# Patient Record
Sex: Female | Born: 1975 | Race: White | Hispanic: No | Marital: Married | Smoking: Former smoker
Health system: Southern US, Community
[De-identification: ages and names within clinical notes are randomized; demographics above are authoritative.]

## PROBLEM LIST (undated history)

## (undated) DIAGNOSIS — E559 Vitamin D deficiency, unspecified: Secondary | ICD-10-CM

## (undated) DIAGNOSIS — J45909 Unspecified asthma, uncomplicated: Secondary | ICD-10-CM

## (undated) DIAGNOSIS — M2550 Pain in unspecified joint: Secondary | ICD-10-CM

## (undated) DIAGNOSIS — T7840XA Allergy, unspecified, initial encounter: Secondary | ICD-10-CM

## (undated) DIAGNOSIS — E119 Type 2 diabetes mellitus without complications: Secondary | ICD-10-CM

## (undated) DIAGNOSIS — E039 Hypothyroidism, unspecified: Secondary | ICD-10-CM

## (undated) DIAGNOSIS — R12 Heartburn: Secondary | ICD-10-CM

## (undated) DIAGNOSIS — R000 Tachycardia, unspecified: Secondary | ICD-10-CM

## (undated) DIAGNOSIS — R7303 Prediabetes: Secondary | ICD-10-CM

## (undated) DIAGNOSIS — I10 Essential (primary) hypertension: Secondary | ICD-10-CM

## (undated) DIAGNOSIS — E079 Disorder of thyroid, unspecified: Secondary | ICD-10-CM

## (undated) DIAGNOSIS — M549 Dorsalgia, unspecified: Secondary | ICD-10-CM

## (undated) DIAGNOSIS — E538 Deficiency of other specified B group vitamins: Secondary | ICD-10-CM

## (undated) DIAGNOSIS — E669 Obesity, unspecified: Secondary | ICD-10-CM

## (undated) DIAGNOSIS — L409 Psoriasis, unspecified: Secondary | ICD-10-CM

## (undated) HISTORY — DX: Essential (primary) hypertension: I10

## (undated) HISTORY — DX: Vitamin D deficiency, unspecified: E55.9

## (undated) HISTORY — DX: Tachycardia, unspecified: R00.0

## (undated) HISTORY — PX: OTHER SURGICAL HISTORY: SHX169

## (undated) HISTORY — DX: Hypothyroidism, unspecified: E03.9

## (undated) HISTORY — DX: Allergy, unspecified, initial encounter: T78.40XA

## (undated) HISTORY — DX: Dorsalgia, unspecified: M54.9

## (undated) HISTORY — DX: Disorder of thyroid, unspecified: E07.9

## (undated) HISTORY — DX: Obesity, unspecified: E66.9

## (undated) HISTORY — PX: HAND SURGERY: SHX662

## (undated) HISTORY — DX: Type 2 diabetes mellitus without complications: E11.9

## (undated) HISTORY — PX: HERNIA REPAIR: SHX51

## (undated) HISTORY — DX: Psoriasis, unspecified: L40.9

## (undated) HISTORY — DX: Prediabetes: R73.03

## (undated) HISTORY — DX: Heartburn: R12

## (undated) HISTORY — DX: Unspecified asthma, uncomplicated: J45.909

## (undated) HISTORY — DX: Pain in unspecified joint: M25.50

## (undated) HISTORY — DX: Deficiency of other specified B group vitamins: E53.8

---

## 2001-07-13 ENCOUNTER — Ambulatory Visit (HOSPITAL_COMMUNITY): Admission: RE | Admit: 2001-07-13 | Discharge: 2001-07-13 | Payer: Self-pay | Admitting: Neurology

## 2001-07-13 ENCOUNTER — Encounter: Payer: Self-pay | Admitting: Neurology

## 2005-09-29 ENCOUNTER — Ambulatory Visit: Payer: Self-pay | Admitting: Obstetrics & Gynecology

## 2006-11-13 ENCOUNTER — Inpatient Hospital Stay: Payer: Self-pay | Admitting: Obstetrics & Gynecology

## 2008-09-20 ENCOUNTER — Inpatient Hospital Stay: Payer: Self-pay | Admitting: Obstetrics & Gynecology

## 2009-09-01 ENCOUNTER — Ambulatory Visit: Payer: Self-pay | Admitting: Family Medicine

## 2009-09-09 ENCOUNTER — Ambulatory Visit: Payer: Self-pay | Admitting: Orthopedic Surgery

## 2011-09-10 ENCOUNTER — Ambulatory Visit: Payer: Self-pay | Admitting: General Practice

## 2012-01-05 ENCOUNTER — Ambulatory Visit: Payer: Self-pay | Admitting: General Practice

## 2012-01-17 ENCOUNTER — Encounter: Payer: Self-pay | Admitting: Emergency Medicine

## 2012-02-11 ENCOUNTER — Encounter: Payer: Self-pay | Admitting: Emergency Medicine

## 2012-05-19 ENCOUNTER — Observation Stay: Payer: Self-pay | Admitting: General Surgery

## 2012-05-19 LAB — HEMOGLOBIN: HGB: 12.9 g/dL (ref 12.0–16.0)

## 2012-05-19 LAB — PREGNANCY, URINE: Pregnancy Test, Urine: NEGATIVE m[IU]/mL

## 2013-09-26 ENCOUNTER — Encounter: Payer: Self-pay | Admitting: Podiatry

## 2013-09-26 ENCOUNTER — Ambulatory Visit (INDEPENDENT_AMBULATORY_CARE_PROVIDER_SITE_OTHER): Payer: PRIVATE HEALTH INSURANCE

## 2013-09-26 ENCOUNTER — Ambulatory Visit (INDEPENDENT_AMBULATORY_CARE_PROVIDER_SITE_OTHER): Payer: PRIVATE HEALTH INSURANCE | Admitting: Podiatry

## 2013-09-26 VITALS — BP 141/84 | HR 81 | Resp 16 | Ht 62.0 in | Wt 341.0 lb

## 2013-09-26 DIAGNOSIS — M79609 Pain in unspecified limb: Secondary | ICD-10-CM

## 2013-09-26 DIAGNOSIS — M79672 Pain in left foot: Secondary | ICD-10-CM

## 2013-09-26 DIAGNOSIS — M722 Plantar fascial fibromatosis: Secondary | ICD-10-CM | POA: Insufficient documentation

## 2013-09-26 MED ORDER — METHYLPREDNISOLONE (PAK) 4 MG PO TABS: ORAL_TABLET | ORAL | Status: DC

## 2013-09-26 MED ORDER — MELOXICAM 15 MG PO TABS: 15.0000 mg | ORAL_TABLET | Freq: Every day | ORAL | Status: DC

## 2013-09-26 NOTE — Patient Instructions (Signed)
Plantar Fasciitis (Heel Spur Syndrome) with Rehab The plantar fascia is a fibrous, ligament-like, soft-tissue structure that spans the bottom of the foot. Plantar fasciitis is a condition that causes pain in the foot due to inflammation of the tissue. SYMPTOMS   Pain and tenderness on the underneath side of the foot.  Pain that worsens with standing or walking. CAUSES  Plantar fasciitis is caused by irritation and injury to the plantar fascia on the underneath side of the foot. Common mechanisms of injury include:  Direct trauma to bottom of the foot.  Damage to a small nerve that runs under the foot where the main fascia attaches to the heel bone.  Stress placed on the plantar fascia due to bone spurs. RISK INCREASES WITH:   Activities that place stress on the plantar fascia (running, jumping, pivoting, or cutting).  Poor strength and flexibility.  Improperly fitted shoes.  Tight calf muscles.  Flat feet.  Failure to warm-up properly before activity.  Obesity. PREVENTION  Warm up and stretch properly before activity.  Allow for adequate recovery between workouts.  Maintain physical fitness:  Strength, flexibility, and endurance.  Cardiovascular fitness.  Maintain a health body weight.  Avoid stress on the plantar fascia.  Wear properly fitted shoes, including arch supports for individuals who have flat feet. PROGNOSIS  If treated properly, then the symptoms of plantar fasciitis usually resolve without surgery. However, occasionally surgery is necessary. RELATED COMPLICATIONS   Recurrent symptoms that may result in a chronic condition.  Problems of the lower back that are caused by compensating for the injury, such as limping.  Pain or weakness of the foot during push-off following surgery.  Chronic inflammation, scarring, and partial or complete fascia tear, occurring more often from repeated injections. TREATMENT  Treatment initially involves the use of  ice and medication to help reduce pain and inflammation. The use of strengthening and stretching exercises may help reduce pain with activity, especially stretches of the Achilles tendon. These exercises may be performed at home or with a therapist. Your caregiver may recommend that you use heel cups of arch supports to help reduce stress on the plantar fascia. Occasionally, corticosteroid injections are given to reduce inflammation. If symptoms persist for greater than 6 months despite non-surgical (conservative), then surgery may be recommended.  MEDICATION   If pain medication is necessary, then nonsteroidal anti-inflammatory medications, such as aspirin and ibuprofen, or other minor pain relievers, such as acetaminophen, are often recommended.  Do not take pain medication within 7 days before surgery.  Prescription pain relievers may be given if deemed necessary by your caregiver. Use only as directed and only as much as you need.  Corticosteroid injections may be given by your caregiver. These injections should be reserved for the most serious cases, because they may only be given a certain number of times. HEAT AND COLD  Cold treatment (icing) relieves pain and reduces inflammation. Cold treatment should be applied for 10 to 15 minutes every 2 to 3 hours for inflammation and pain and immediately after any activity that aggravates your symptoms. Use ice packs or massage the area with a piece of ice (ice massage).  Heat treatment may be used prior to performing the stretching and strengthening activities prescribed by your caregiver, physical therapist, or athletic trainer. Use a heat pack or soak the injury in warm water. SEEK IMMEDIATE MEDICAL CARE IF:  Treatment seems to offer no benefit, or the condition worsens.  Any medications produce adverse side effects. EXERCISES RANGE   OF MOTION (ROM) AND STRETCHING EXERCISES - Plantar Fasciitis (Heel Spur Syndrome) These exercises may help you  when beginning to rehabilitate your injury. Your symptoms may resolve with or without further involvement from your physician, physical therapist or athletic trainer. While completing these exercises, remember:   Restoring tissue flexibility helps normal motion to return to the joints. This allows healthier, less painful movement and activity.  An effective stretch should be held for at least 30 seconds.  A stretch should never be painful. You should only feel a gentle lengthening or release in the stretched tissue. RANGE OF MOTION - Toe Extension, Flexion  Sit with your right / left leg crossed over your opposite knee.  Grasp your toes and gently pull them back toward the top of your foot. You should feel a stretch on the bottom of your toes and/or foot.  Hold this stretch for __________ seconds.  Now, gently pull your toes toward the bottom of your foot. You should feel a stretch on the top of your toes and or foot.  Hold this stretch for __________ seconds. Repeat __________ times. Complete this stretch __________ times per day.  RANGE OF MOTION - Ankle Dorsiflexion, Active Assisted  Remove shoes and sit on a chair that is preferably not on a carpeted surface.  Place right / left foot under knee. Extend your opposite leg for support.  Keeping your heel down, slide your right / left foot back toward the chair until you feel a stretch at your ankle or calf. If you do not feel a stretch, slide your bottom forward to the edge of the chair, while still keeping your heel down.  Hold this stretch for __________ seconds. Repeat __________ times. Complete this stretch __________ times per day.  STRETCH  Gastroc, Standing  Place hands on wall.  Extend right / left leg, keeping the front knee somewhat bent.  Slightly point your toes inward on your back foot.  Keeping your right / left heel on the floor and your knee straight, shift your weight toward the wall, not allowing your back to  arch.  You should feel a gentle stretch in the right / left calf. Hold this position for __________ seconds. Repeat __________ times. Complete this stretch __________ times per day. STRETCH  Soleus, Standing  Place hands on wall.  Extend right / left leg, keeping the other knee somewhat bent.  Slightly point your toes inward on your back foot.  Keep your right / left heel on the floor, bend your back knee, and slightly shift your weight over the back leg so that you feel a gentle stretch deep in your back calf.  Hold this position for __________ seconds. Repeat __________ times. Complete this stretch __________ times per day. STRETCH  Gastrocsoleus, Standing  Note: This exercise can place a lot of stress on your foot and ankle. Please complete this exercise only if specifically instructed by your caregiver.   Place the ball of your right / left foot on a step, keeping your other foot firmly on the same step.  Hold on to the wall or a rail for balance.  Slowly lift your other foot, allowing your body weight to press your heel down over the edge of the step.  You should feel a stretch in your right / left calf.  Hold this position for __________ seconds.  Repeat this exercise with a slight bend in your right / left knee. Repeat __________ times. Complete this stretch __________ times per day.    STRENGTHENING EXERCISES - Plantar Fasciitis (Heel Spur Syndrome)  These exercises may help you when beginning to rehabilitate your injury. They may resolve your symptoms with or without further involvement from your physician, physical therapist or athletic trainer. While completing these exercises, remember:   Muscles can gain both the endurance and the strength needed for everyday activities through controlled exercises.  Complete these exercises as instructed by your physician, physical therapist or athletic trainer. Progress the resistance and repetitions only as guided. STRENGTH - Towel  Curls  Sit in a chair positioned on a non-carpeted surface.  Place your foot on a towel, keeping your heel on the floor.  Pull the towel toward your heel by only curling your toes. Keep your heel on the floor.  If instructed by your physician, physical therapist or athletic trainer, add ____________________ at the end of the towel. Repeat __________ times. Complete this exercise __________ times per day. STRENGTH - Ankle Inversion  Secure one end of a rubber exercise band/tubing to a fixed object (table, pole). Loop the other end around your foot just before your toes.  Place your fists between your knees. This will focus your strengthening at your ankle.  Slowly, pull your big toe up and in, making sure the band/tubing is positioned to resist the entire motion.  Hold this position for __________ seconds.  Have your muscles resist the band/tubing as it slowly pulls your foot back to the starting position. Repeat __________ times. Complete this exercises __________ times per day.  Document Released: 11/29/2005 Document Revised: 02/21/2012 Document Reviewed: 03/13/2009 [employer] Patient Information 2014 [employer]. Plantar Fasciitis Plantar fasciitis is a common condition that causes foot pain. It is soreness (inflammation) of the band of tough fibrous tissue on the bottom of the foot that runs from the heel bone (calcaneus) to the ball of the foot. The cause of this soreness may be from excessive standing, poor fitting shoes, running on hard surfaces, being overweight, having an abnormal walk, or overuse (this is common in runners) of the painful foot or feet. It is also common in aerobic exercise dancers and ballet dancers. SYMPTOMS  Most people with plantar fasciitis complain of:  Severe pain in the morning on the bottom of their foot especially when taking the first steps out of bed. This pain recedes after a few minutes of walking.  Severe pain is experienced also during walking  following a long period of inactivity.  Pain is worse when walking barefoot or up stairs DIAGNOSIS   Your caregiver will diagnose this condition by examining and feeling your foot.  Special tests such as X-rays of your foot, are usually not needed. PREVENTION   Consult a sports medicine professional before beginning a new exercise program.  Walking programs offer a good workout. With walking there is a lower chance of overuse injuries common to runners. There is less impact and less jarring of the joints.  Begin all new exercise programs slowly. If problems or pain develop, decrease the amount of time or distance until you are at a comfortable level.  Wear good shoes and replace them regularly.  Stretch your foot and the heel cords at the back of the ankle (Achilles tendon) both before and after exercise.  Run or exercise on even surfaces that are not hard. For example, asphalt is better than pavement.  Do not run barefoot on hard surfaces.  If using a treadmill, vary the incline.  Do not continue to workout if you have foot or joint   problems. Seek professional help if they do not improve. HOME CARE INSTRUCTIONS   Avoid activities that cause you pain until you recover.  Use ice or cold packs on the problem or painful areas after working out.  Only take over-the-counter or prescription medicines for pain, discomfort, or fever as directed by your caregiver.  Soft shoe inserts or athletic shoes with air or gel sole cushions may be helpful.  If problems continue or become more severe, consult a sports medicine caregiver or your own health care provider. Cortisone is a potent anti-inflammatory medication that may be injected into the painful area. You can discuss this treatment with your caregiver. MAKE SURE YOU:   Understand these instructions.  Will watch your condition.  Will get help right away if you are not doing well or get worse. Document Released: 08/24/2001 Document  Revised: 02/21/2012 Document Reviewed: 10/23/2008 [employer] Patient Information 2014 [employer].  

## 2013-09-26 NOTE — Progress Notes (Signed)
Amy [name] presents today is a 37 year old white female with a chief complaint of painful heel x1 month left. She states that it's painful after sitting or after standing for long periods of time. She states that she's tried ice rolling on a tennis ball and ibuprofen. Denies any trauma related to this. She did state that she get a new pair of tennis shoes does seem to help with the pain in the left heel.  Objective: Vital signs are stable she is alert and oriented x3. I have reviewed her past medical history medications and allergies. Pulses are palpable strong and equal bilateral. Neurologic sensorium is intact bilateral. Deep tendon reflexes are brisk and equal. Muscle strength and tone normal bilateral. Orthopedic evaluation demonstrates all joints distal to the ankle have a full range of motion without crepitus and pain. She has no pain on medial lateral compression of the calcaneus. She does have pain on direct palpation of the medial calcaneal tubercle of the left heel. Radiographic evaluation does confirm a soft tissue increase in density at the plantar fascial calcaneal insertion site of the left heel. Cutaneous evaluation demonstrates supple hydrated cutis no erythema edema saline is drainage or odor.  Assessment: Plantar fasciitis left heel.  Plan: We discussed her etiology pathology conservative versus surgical therapies. At this point we decided to inject the left heel. 20 mg of Kenalog and local anesthetic was injected. A put her in a plantar fascial strapping left. A night splint left. Discussed appropriate shoe gear stretching exercises and ice therapy. Also supplied handouts on these. Discussed appropriate shoe gear in detail. Right 2 prescriptions. Medrol Dosepak and Mobic. I will followup with her in one month.

## 2013-09-26 NOTE — Progress Notes (Signed)
N stabbing  L left plantar heel  D 1 month  O gradual  C worse  A sitting to standing T ice ,roll tennis ball , ibuprofen

## 2013-10-25 ENCOUNTER — Ambulatory Visit: Payer: PRIVATE HEALTH INSURANCE | Admitting: Podiatry

## 2013-10-31 ENCOUNTER — Ambulatory Visit (INDEPENDENT_AMBULATORY_CARE_PROVIDER_SITE_OTHER): Payer: PRIVATE HEALTH INSURANCE | Admitting: Podiatry

## 2013-10-31 ENCOUNTER — Encounter: Payer: Self-pay | Admitting: Podiatry

## 2013-10-31 VITALS — BP 82/64 | HR 63 | Resp 16 | Ht 62.0 in | Wt 310.0 lb

## 2013-10-31 DIAGNOSIS — M722 Plantar fascial fibromatosis: Secondary | ICD-10-CM

## 2013-10-31 NOTE — Progress Notes (Signed)
Amy [name] presents today for followup of her plantar fasciitis she relates it is 50% improved.  Objective: Pulses are palpable left foot she has pain on palpation medial continued tubercle left heel.  Assessment: Plantar fasciitis left.  Plan: Continue all conservative therapies. In reinjected the left heel today with a plantar fascial strapping applied.

## 2013-11-28 ENCOUNTER — Ambulatory Visit: Payer: PRIVATE HEALTH INSURANCE | Admitting: Podiatry

## 2013-12-03 ENCOUNTER — Ambulatory Visit (INDEPENDENT_AMBULATORY_CARE_PROVIDER_SITE_OTHER): Payer: PRIVATE HEALTH INSURANCE | Admitting: Podiatry

## 2013-12-03 ENCOUNTER — Ambulatory Visit (INDEPENDENT_AMBULATORY_CARE_PROVIDER_SITE_OTHER): Payer: PRIVATE HEALTH INSURANCE

## 2013-12-03 ENCOUNTER — Encounter: Payer: Self-pay | Admitting: Podiatry

## 2013-12-03 VITALS — BP 142/83 | HR 75 | Resp 16 | Ht 62.0 in | Wt 300.0 lb

## 2013-12-03 DIAGNOSIS — M79671 Pain in right foot: Secondary | ICD-10-CM

## 2013-12-03 DIAGNOSIS — M722 Plantar fascial fibromatosis: Secondary | ICD-10-CM

## 2013-12-03 DIAGNOSIS — M79609 Pain in unspecified limb: Secondary | ICD-10-CM

## 2013-12-03 NOTE — Progress Notes (Signed)
She presents today stating that her left was doing much better still has a little tenderness in the left heel. She states that she was turning to pick up her husband he was passing out and strained her right arch. She continues all conservative therapies to the left foot at this time.  Objective: Vital signs are stable she is alert and oriented x3. Pulses are palpable bilateral. Mild tenderness on palpation of the medial calcaneal tubercle of the left heel. She has mild tenderness on palpation along the medial calcaneal tubercle of the right heel and medial longitudinal arch. Radiographic evaluation of the right foot does not demonstrate any type of osseous abnormalities.  Assessment: Strained or sprained of the plantar fascia right. Approximately 85% resolution of the left foot pain.  Plan: Reinjected the left heel today for plantar fasciitis. Plantar fascial strapping was applied bilaterally. Continue all conservative therapies.

## 2014-01-14 ENCOUNTER — Ambulatory Visit: Payer: PRIVATE HEALTH INSURANCE | Admitting: Podiatry

## 2014-07-09 ENCOUNTER — Ambulatory Visit: Payer: Self-pay | Admitting: General Practice

## 2014-09-27 ENCOUNTER — Other Ambulatory Visit: Payer: Self-pay

## 2015-04-06 NOTE — Op Note (Signed)
PATIENT NAME:  Amy [name], Amy [name] MR#:  [id number] DATE OF BIRTH:  1976/12/02  DATE OF PROCEDURE:  05/19/2012  PREOPERATIVE DIAGNOSIS: Ventral hernia.   POSTOPERATIVE DIAGNOSIS: Ventral hernia.   OPERATIVE PROCEDURE: Ventral hernia repair with retrorectus mesh placement.  SURGEON: Hervey Ard, MD  ASSISTANT: Mckinley Jewel, MD   ANESTHESIA: General endotracheal under Dr. Marcello Moores.   ESTIMATED BLOOD LOSS: Less than 50 mL.   CLINICAL NOTE: This morbidly obese 39 year old woman developed a ventral hernia likely secondary to a previous cesarean section. She declined bariatric surgery prior to the procedure in spite of the estimated 60% chance of hernia recurrence. She is brought to the operating room at this time for elective repair. She did receive Kefzol prior to the procedure. She had thigh-high TED stockings and pneumatic compression stockings for deep vein thrombosis prophylaxis. A Foley catheter was placed by the nurse after the induction of anesthesia.   DESCRIPTION OF PROCEDURE: Preoperative CT completed in September 2012 had showed a single fascial defect in the lower abdomen. This contained a large amount of large and small bowel.   The abdomen was prepped with ChloraPrep and draped. A lower midline incision was made. The hernia sac was identified and entered. There was noted to be moderate inflammation of the omentum to the superior surface of the sac. This was taken down with careful cautery dissection. The large and small bowel were reduced into the abdominal cavity. The sac was then excised from the adjacent soft tissue and discarded. The fascial defect was approximately 6 cm in diameter. The retrorectus space was opened bilaterally and continued inferior and superior to the hernia defect. A 15 x 15 cm PhysioMesh was then placed into this area. Transfacial sutures were placed x3 on both the right and left side. On the left side, where the hernia sac had dissected the anterior surface of  the rectus muscle this could be placed under direct vision through the lateral border of the rectus muscle. On the right side, a percutaneous approach was utilized with transfacial sutures of 0 Surgilon being placed. Superior and inferior anchoring sutures were placed. All these sutures were about 7 cm from the hernia defect. Prior to placement of the mesh the peritoneum and posterior rectus sheath was closed with interrupted 2-0 Vicryl figure-of-eight sutures. After placement of the mesh, a 15-gauge Blake drain was placed into the tissue and brought out through the right side of the abdomen and anchored in place with 3-0 nylon. The midline fascia could then be approximated with interrupted 0 Surgilon figure-of-eight sutures. The area was irrigated during closure. A similar 15-gauge Blake drain was brought out through the left side of the abdomen for decompression of the adipose tissue. The adipose layer was approximated with multiple layers of 2-0 Vicryl figure-of-eight sutures anchoring it to the deep fascia. The skin was closed after approximation of the dermal area with interrupted 3-0 Vicryl sutures by staples. The drains were placed to self suction and a Telfa and ABD pad dressing applied. The patient awakened somewhat abruptly and bucked vigorously. External pressure was held over the repair site. I had the sense that one of the transfacial sutures did break during this vigorous straining by the patient. No wound injury was appreciated. The patient subsequently was extubated with no further coughing and transferred to the recovery room in stable condition. ____________________________ Robert Bellow, MD jwb:slb D: 05/19/2012 18:34:00 ET T: 05/20/2012 11:31:18 ET JOB#: [id number]  cc: Robert Bellow, MD, <Dictator> Milinda Pointer. [place],  MD Charnelle Bergeman Amedeo Kinsman MD ELECTRONICALLY SIGNED 05/23/2012 22:19

## 2015-05-29 ENCOUNTER — Other Ambulatory Visit: Payer: Self-pay | Admitting: Family Medicine

## 2015-10-06 ENCOUNTER — Other Ambulatory Visit: Payer: Self-pay | Admitting: Physician Assistant

## 2015-10-06 ENCOUNTER — Telehealth: Payer: Self-pay | Admitting: Physician Assistant

## 2015-10-06 MED ORDER — MONTELUKAST SODIUM 10 MG PO TABS: 10.0000 mg | ORAL_TABLET | Freq: Every day | ORAL | Status: DC

## 2015-11-14 ENCOUNTER — Ambulatory Visit: Payer: Self-pay | Admitting: Physician Assistant

## 2015-11-14 ENCOUNTER — Encounter: Payer: Self-pay | Admitting: Physician Assistant

## 2015-11-14 VITALS — BP 120/80 | HR 110 | Temp 98.5°F

## 2015-11-14 DIAGNOSIS — J209 Acute bronchitis, unspecified: Secondary | ICD-10-CM

## 2015-11-14 MED ORDER — METHYLPREDNISOLONE 4 MG PO TBPK: ORAL_TABLET | ORAL | Status: DC

## 2015-11-14 MED ORDER — ALBUTEROL SULFATE (2.5 MG/3ML) 0.083% IN NEBU: 2.5000 mg | INHALATION_SOLUTION | Freq: Four times a day (QID) | RESPIRATORY_TRACT | Status: DC | PRN

## 2015-11-14 MED ORDER — CEFDINIR 300 MG PO CAPS: 300.0000 mg | ORAL_CAPSULE | Freq: Two times a day (BID) | ORAL | Status: DC

## 2015-11-14 MED ORDER — FLUCONAZOLE 150 MG PO TABS: ORAL_TABLET | ORAL | Status: DC

## 2015-11-14 NOTE — Progress Notes (Signed)
S: C/o cough and congestion with wheezing and chest soreness, chest is sore from coughing, denies fever, chills, cough is dry and hacking; keeping pt awake at night;  denies cardiac type chest pain or sob, v/d, abd pain Remainder ros neg, using albuterol inhaler without a lot of relief  O: vitals wnl, nad, tms clear, throat injected, neck supple no lymph, lungs c t a, cv rrr, neuro intact, cough is dry and tight  A:  Acute bronchitis   P:  rx medication: omnicef 300mg  bid x 10d, medrol dose pack, albuterol nebules, dilfucan   ;  use otc meds, tylenol or motrin as needed for fever/chills, return if not better in 3 -5 days, return earlier if worsening

## 2015-11-20 ENCOUNTER — Encounter: Payer: Self-pay | Admitting: Physician Assistant

## 2015-11-20 ENCOUNTER — Ambulatory Visit: Payer: Self-pay | Admitting: Physician Assistant

## 2015-11-20 VITALS — BP 119/80 | HR 114 | Temp 97.9°F

## 2015-11-20 DIAGNOSIS — J209 Acute bronchitis, unspecified: Secondary | ICD-10-CM

## 2015-11-20 MED ORDER — LEVOFLOXACIN 750 MG PO TABS: 750.0000 mg | ORAL_TABLET | Freq: Every day | ORAL | Status: DC

## 2015-11-20 MED ORDER — HYDROCOD POLST-CPM POLST ER 10-8 MG/5ML PO SUER: 5.0000 mL | Freq: Two times a day (BID) | ORAL | Status: DC | PRN

## 2015-11-20 NOTE — Progress Notes (Signed)
S: here for recheck, cough and wheezing are worse, had fever last night, no cp/sob today, is taking omnicef but not a lot better, took medrol dose pack and it didn't seem to help, using other meds as prescribed  O: vitals wnl, nad, cough is dry , tight, hacking, ENT wnl, neck supple no lymph, lungs with expiratory wheezing, cv tachy but reg rhythm  A: acute bronchitis, ?pneumonia  P: levaquin 750mg  qd x10d, tussionex 173ml nr

## 2016-01-05 ENCOUNTER — Encounter: Payer: Self-pay | Admitting: Podiatry

## 2016-01-05 ENCOUNTER — Ambulatory Visit (INDEPENDENT_AMBULATORY_CARE_PROVIDER_SITE_OTHER): Payer: Managed Care, Other (non HMO)

## 2016-01-05 ENCOUNTER — Ambulatory Visit (INDEPENDENT_AMBULATORY_CARE_PROVIDER_SITE_OTHER): Payer: Managed Care, Other (non HMO) | Admitting: Podiatry

## 2016-01-05 VITALS — BP 155/90 | HR 82 | Resp 16

## 2016-01-05 DIAGNOSIS — M779 Enthesopathy, unspecified: Secondary | ICD-10-CM | POA: Diagnosis not present

## 2016-01-05 DIAGNOSIS — S86312A Strain of muscle(s) and tendon(s) of peroneal muscle group at lower leg level, left leg, initial encounter: Secondary | ICD-10-CM | POA: Diagnosis not present

## 2016-01-05 DIAGNOSIS — M79672 Pain in left foot: Secondary | ICD-10-CM | POA: Diagnosis not present

## 2016-01-05 MED ORDER — MELOXICAM 15 MG PO TABS: 15.0000 mg | ORAL_TABLET | Freq: Every day | ORAL | Status: DC

## 2016-01-05 NOTE — Progress Notes (Signed)
She presents today with a chief complaint of pain to the left heel times about a month. She states that she was walking through her house and felt a ripping sensation in her to tearing noise coming from her left foot. She states that it has hurt everyday since that seems to be approximately 30% improved from where it was.  Objective: Vital signs are stable she is alert and oriented 3. Pulses are palpable. Full range of motion of all of the ankle joints that she does have pain on plantarflexion and eversion against resistance of the left foot. She has tenderness to palpation of the peroneal tendons as it coursed beneath the lateral malleolus extending to the  Fifth metatarsal base and the cuboid arcuate region. Radiographs do demonstrate no fractures but does demonstrate thickening of the peroneal tendons.   Assessment: at the very least a sprain of the peroneal tendons of the left foot. Cannot rule out tear at this point.   plan: Discussed etiology pathology conservative versus surgical therapies. We are placing her in a Cam Walker for one month I will follow-up with her at that time and if she is not significantly better then we will consider sending her for an MRI of the left foot.

## 2016-01-28 ENCOUNTER — Ambulatory Visit (INDEPENDENT_AMBULATORY_CARE_PROVIDER_SITE_OTHER): Payer: Managed Care, Other (non HMO) | Admitting: Podiatry

## 2016-01-28 ENCOUNTER — Encounter: Payer: Self-pay | Admitting: Podiatry

## 2016-01-28 VITALS — BP 145/86 | HR 80 | Resp 16

## 2016-01-28 DIAGNOSIS — S86312A Strain of muscle(s) and tendon(s) of peroneal muscle group at lower leg level, left leg, initial encounter: Secondary | ICD-10-CM

## 2016-01-28 NOTE — Progress Notes (Signed)
She presents today for follow-up of her peroneal tendinitis of her left foot. She's been wearing her boot for over a month now and states that the pain is only moderately decreased. She states that she's been wearing the boot regularly.  Objective: Vital signs are stable alert and oriented 3. She is pain on palpation of the nail tendons inferior to the fibular malleolus and just proximal to it. Requesting MRI of the left ankle.  Assessment: Chronic pain left foot peroneal tendinitis possible tear.  Plan: Encouraged her to continue use of the boot while requesting an MRI of the left ankle.

## 2016-01-31 ENCOUNTER — Other Ambulatory Visit: Payer: Self-pay | Admitting: Physician Assistant

## 2016-02-02 NOTE — Telephone Encounter (Signed)
Med refill approved 

## 2016-02-03 ENCOUNTER — Telehealth: Payer: Self-pay | Admitting: *Deleted

## 2016-02-03 NOTE — Telephone Encounter (Addendum)
Pt states has not received a call to set up MRI appt.  I told pt I had not received orders from Dr. Milinda Pointer, but did see in his dictation 01/28/2016 02/03/2016-[employer] PRIOR AUTHORIZATION PT:[id number], VALID 02/03/2016 TO 05/03/2016.  FAXED TO [facility].  Informed pt's str, Amy [name] of the approval and to call to schedule MRI.  02/20/2016-LEFT MESSAGE WITH EMPLOYER TO HAVE PT call and leave permission to leave a message on her phone concerning results.  faxed request for copy of MRI disc to [facility].  PT CALLED AND I INFORMED of the delay of MRI results due to more in-depth reading.  Pt states understanding.  02/26/2016-Received MRI disc copy from [facility] and mailed to [place].

## 2016-02-18 ENCOUNTER — Other Ambulatory Visit: Payer: Self-pay | Admitting: Physician Assistant

## 2016-02-19 ENCOUNTER — Ambulatory Visit
Admission: RE | Admit: 2016-02-19 | Discharge: 2016-02-19 | Disposition: A | Discharge status: Home / Self Care | Payer: 59 | Source: Ambulatory Visit | Attending: Podiatry | Admitting: Podiatry

## 2016-02-19 DIAGNOSIS — X58XXXA Exposure to other specified factors, initial encounter: Secondary | ICD-10-CM | POA: Insufficient documentation

## 2016-02-19 DIAGNOSIS — S86312A Strain of muscle(s) and tendon(s) of peroneal muscle group at lower leg level, left leg, initial encounter: Secondary | ICD-10-CM | POA: Diagnosis not present

## 2016-02-19 DIAGNOSIS — M19072 Primary osteoarthritis, left ankle and foot: Secondary | ICD-10-CM | POA: Insufficient documentation

## 2016-02-20 NOTE — Telephone Encounter (Signed)
-----   Message from Garrel Ridgel, [place] sent at 02/19/2016  4:47 PM EST ----- Needs to be sent out for over read please and inform patient of the delay.

## 2016-02-26 ENCOUNTER — Other Ambulatory Visit: Payer: Self-pay | Admitting: Physician Assistant

## 2016-02-26 NOTE — Telephone Encounter (Signed)
Med refill approved, pt has hx of vit d deficiency 

## 2016-03-12 ENCOUNTER — Encounter: Payer: Self-pay | Admitting: Podiatry

## 2016-03-17 ENCOUNTER — Encounter: Payer: Self-pay | Admitting: Podiatry

## 2016-03-17 ENCOUNTER — Ambulatory Visit (INDEPENDENT_AMBULATORY_CARE_PROVIDER_SITE_OTHER): Payer: Managed Care, Other (non HMO) | Admitting: Podiatry

## 2016-03-17 VITALS — BP 172/93 | HR 84 | Resp 16

## 2016-03-17 DIAGNOSIS — S86312D Strain of muscle(s) and tendon(s) of peroneal muscle group at lower leg level, left leg, subsequent encounter: Secondary | ICD-10-CM | POA: Diagnosis not present

## 2016-03-17 NOTE — Progress Notes (Signed)
She presents today for follow-up of her MRI report. She states that she continue to wear the boot the majority of the time and her plantar fasciitis seems to be resolved.  Objective: Vital signs are stable alert and oriented 3 she has no reproducible pain today on palpation of the medial calcaneal tubercle. 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 She also demonstrates some tendinitis of the peroneal tendons.  Assessment: Resolving plantar fasciitis.  Plan: I recommended that she get back to her regular routine. Should she redeveloped plantar fasciitis we will perform an endoscopic plantar fasciotomy.

## 2016-04-06 ENCOUNTER — Encounter: Payer: Self-pay | Admitting: Physician Assistant

## 2016-04-06 ENCOUNTER — Ambulatory Visit: Payer: Self-pay | Admitting: Physician Assistant

## 2016-04-06 VITALS — BP 150/89 | HR 100 | Temp 98.1°F

## 2016-04-06 DIAGNOSIS — J4520 Mild intermittent asthma, uncomplicated: Secondary | ICD-10-CM

## 2016-04-06 MED ORDER — BUDESONIDE 1 MG/2ML IN SUSP: 1.0000 mg | Freq: Every day | RESPIRATORY_TRACT | Status: DC

## 2016-04-06 MED ORDER — IPRATROPIUM-ALBUTEROL 0.5-2.5 (3) MG/3ML IN SOLN
3.0000 mL | Freq: Once | RESPIRATORY_TRACT | Status: AC
  Administered 2016-04-06: 3 mL via RESPIRATORY_TRACT

## 2016-04-06 MED ORDER — METHYLPREDNISOLONE 4 MG PO TBPK: ORAL_TABLET | ORAL | Status: DC

## 2016-04-06 NOTE — Progress Notes (Signed)
S: c/o wheezing and chronic cough, no fever/chills, cp/sob, states is using albuterol nebules at home and inhaler which isn't helping much, cough is dry, no swelling in feet ankles, both children have asthma  O: vitals wnl, nad, ENT wnl, neck supple no lymph, lungs c wheezing in all lung fields, cv rrr, svn duoneb given  A: asthma  P: refer to pulmonology, use albuterol nebules, rx for pulmicort nebules, medrol dose pack

## 2016-04-09 NOTE — Progress Notes (Signed)
Patient ID: Amy [name], female   DOB: 08-23-1976, 40 y.o.   MRN: [id number] Patient called and expressed that she is coughing up green and yellow mucus.  I advised Manuela Schwartz and she authorized me to call in [place] in the [facility] in [place].

## 2016-05-02 ENCOUNTER — Other Ambulatory Visit: Payer: Self-pay | Admitting: Podiatry

## 2016-06-21 ENCOUNTER — Other Ambulatory Visit: Payer: Self-pay | Admitting: Physician Assistant

## 2016-06-30 ENCOUNTER — Encounter: Payer: Self-pay | Admitting: Physician Assistant

## 2016-06-30 ENCOUNTER — Ambulatory Visit: Payer: Self-pay | Admitting: Physician Assistant

## 2016-06-30 VITALS — BP 119/80 | HR 82 | Temp 97.6°F

## 2016-06-30 DIAGNOSIS — M792 Neuralgia and neuritis, unspecified: Secondary | ICD-10-CM

## 2016-06-30 MED ORDER — DEXAMETHASONE SODIUM PHOSPHATE 100 MG/10ML IJ SOLN
10.0000 mg | Freq: Once | INTRAMUSCULAR | Status: AC
  Administered 2016-06-30: 10 mg via INTRAMUSCULAR

## 2016-06-30 MED ORDER — METHOCARBAMOL 750 MG PO TABS: 750.0000 mg | ORAL_TABLET | Freq: Four times a day (QID) | ORAL | Status: DC

## 2016-06-30 MED ORDER — METHYLPREDNISOLONE 4 MG PO TBPK: ORAL_TABLET | ORAL | Status: DC

## 2016-06-30 NOTE — Progress Notes (Signed)
   Subjective:Right arm pain    Patient ID: Amy [name], female    DOB: [birth year], 40 y.o.   MRN: [id number]  HPI Patient c/o 3 weeks of radicular pain form neck to shoulder. States pain wax and wane. Denies loss of function or sensation to right upper extremity. States compliant has worsen in past two days. No palliative measure for compliant.    Review of Systems Morbid obesity    Objective:   Physical Exam No acute distress. No cervical deformity. No guarding with palpation of spinal process. Mild guarding with palpation of superior aspect of shoulder. Muscle spasms with abduction. N/V intact.       Assessment & Plan:Cervical Radicular pain to right shoulder  Decadron 10 mg given in clinic, follow by prescription for Medrol Dose pack and Robaxin.  Follow up in 2 days.

## 2016-07-02 ENCOUNTER — Ambulatory Visit: Payer: Self-pay | Admitting: Physician Assistant

## 2016-07-02 ENCOUNTER — Encounter: Payer: Self-pay | Admitting: Physician Assistant

## 2016-07-02 VITALS — BP 120/70 | HR 73 | Temp 98.0°F

## 2016-07-02 DIAGNOSIS — M792 Neuralgia and neuritis, unspecified: Secondary | ICD-10-CM

## 2016-07-02 MED ORDER — TRAMADOL HCL 50 MG PO TABS: 50.0000 mg | ORAL_TABLET | Freq: Three times a day (TID) | ORAL | Status: DC | PRN

## 2016-07-02 NOTE — Progress Notes (Signed)
   Subjective:Neck and shoulder pain    Patient ID: Amy [name], female    DOB: Oct 11, 1976, 40 y.o.   MRN: [id number]  HPIPatient f/u for neck and right shoulder pain. Seen 3 days ago. Started on Robaxin and prednisone. States much improvement with neck movement. Still c/o pain.    Review of Systems Seasonal Rhinitis    Objective:   Physical Exam No obvious deformity to neck . Increased ROM with lateral movement. Moderate guarding superior aspect of right shoulder.       Assessment & Plan:Resolving radicular neck pain  Continue previous mediation and start Tramadol as directed. Follow up as needed.

## 2016-09-05 ENCOUNTER — Other Ambulatory Visit: Payer: Self-pay | Admitting: Physician Assistant

## 2016-09-06 NOTE — Telephone Encounter (Signed)
Med refill approved 

## 2016-10-15 ENCOUNTER — Encounter: Payer: Self-pay | Admitting: Physician Assistant

## 2016-10-15 ENCOUNTER — Ambulatory Visit: Payer: Self-pay | Admitting: Physician Assistant

## 2016-10-15 VITALS — BP 150/90 | HR 79 | Temp 97.9°F

## 2016-10-15 DIAGNOSIS — S161XXA Strain of muscle, fascia and tendon at neck level, initial encounter: Secondary | ICD-10-CM

## 2016-10-15 MED ORDER — METHYLPREDNISOLONE 4 MG PO TBPK: ORAL_TABLET | ORAL | 0 refills | Status: DC

## 2016-10-15 MED ORDER — CYCLOBENZAPRINE HCL 10 MG PO TABS: 10.0000 mg | ORAL_TABLET | Freq: Three times a day (TID) | ORAL | 0 refills | Status: DC | PRN

## 2016-10-15 NOTE — Progress Notes (Signed)
S: c/o neck pain after sneezing, states has been sneezing and ears are stopped up, when she sneezed yesterday had a lot of pain in the back of her neck to shoulder, no numbness or tingling, no weakness in r arm, did have a little fever but is controlled with tylenol, states pain in neck is worse with hyperextending not leaning forward, some diarrhea today  O: vitals wnl, nad, skin intact, tms dull, nasal mucosa boggy, throat wnl, neck supple no lymph, lungs c t a, cv rrr, abd soft nontender; trapezious muscle tender, spasmed, grip has good strength, n/v intact  A: acute muscle strain of neck, seasonal allergies, viral diarrhea  P: medrol dose pack, flexeril, return if any problems

## 2016-10-26 ENCOUNTER — Encounter: Payer: Self-pay | Admitting: Physician Assistant

## 2016-10-26 ENCOUNTER — Ambulatory Visit: Payer: Self-pay | Admitting: Physician Assistant

## 2016-10-26 VITALS — BP 140/90 | HR 96 | Temp 98.3°F

## 2016-10-26 DIAGNOSIS — M542 Cervicalgia: Secondary | ICD-10-CM

## 2016-10-26 MED ORDER — METHYLPREDNISOLONE 4 MG PO TBPK: ORAL_TABLET | ORAL | 0 refills | Status: DC

## 2016-10-26 MED ORDER — METHOCARBAMOL 750 MG PO TABS: 750.0000 mg | ORAL_TABLET | Freq: Four times a day (QID) | ORAL | 0 refills | Status: DC

## 2016-10-26 NOTE — Progress Notes (Signed)
Patient has been referred to see Reche Dixon at [facility] in [place] on 11/01/16 @ 10:30am. Patient has been notified and has accepted the appointment.

## 2016-10-26 NOTE — Progress Notes (Signed)
S: c/o neck and shoulder pain, sx for over a week, was at hospital all last week with her mother, has been doing a lot of reading, pain is in r side of upper back and radiates to her shoulder, is spasmodic, no known injury, no numbness or tingling, used otc advil and flexeril without relief, cannot use flexeril during the day because it makes her drowsy  O: vitals wnl, nad, perrl eomi, cspine nontender, tspine nontender, + spasms in long muscle of mid back on right side, pain reproduced with movement of neck, grips = b/l, n/v intact  A: acute neck and shoulder pain due to muscle spasm  P: medrol dose pack, flexeril at night, robaxin during the day, wet heat followed by ice, f/u with chiropractor, will refer to [facility] ortho

## 2016-11-26 ENCOUNTER — Other Ambulatory Visit: Payer: Self-pay

## 2016-11-26 DIAGNOSIS — Z299 Encounter for prophylactic measures, unspecified: Secondary | ICD-10-CM

## 2016-11-26 NOTE — Progress Notes (Signed)
Patient came in to have blood drawn for testing per Susan's authorization. 

## 2016-11-27 LAB — HEPATITIS B SURFACE ANTIBODY,QUALITATIVE: HEP B SURFACE AB, QUAL: NONREACTIVE

## 2016-11-27 LAB — HCV INTERPRETATION

## 2016-11-27 LAB — HCV AB W/RFLX TO VERIFICATION: HCV Ab: <0.1 s/co ratio (ref 0.0–0.9)

## 2016-11-27 LAB — HEPATITIS B E ANTIGEN: Hep B E Ag: NEGATIVE

## 2016-12-02 ENCOUNTER — Ambulatory Visit: Payer: Self-pay | Admitting: Physician Assistant

## 2016-12-02 DIAGNOSIS — Z299 Encounter for prophylactic measures, unspecified: Secondary | ICD-10-CM

## 2016-12-02 NOTE — Progress Notes (Signed)
Patient came in to get her first Hepatitis B vaccine per Susan's authorization.

## 2017-01-03 ENCOUNTER — Ambulatory Visit: Payer: Self-pay

## 2017-01-03 DIAGNOSIS — Z299 Encounter for prophylactic measures, unspecified: Secondary | ICD-10-CM

## 2017-01-03 NOTE — Progress Notes (Unsigned)
Patient came in to have her 2nd Hepatitis injection.

## 2017-02-15 ENCOUNTER — Ambulatory Visit: Payer: Self-pay | Admitting: Physician Assistant

## 2017-02-15 VITALS — BP 140/100 | HR 92 | Temp 98.5°F

## 2017-02-15 DIAGNOSIS — M25562 Pain in left knee: Secondary | ICD-10-CM

## 2017-02-15 MED ORDER — DICLOFENAC SODIUM 1 % TD GEL: 4.0000 g | Freq: Four times a day (QID) | TRANSDERMAL | 3 refills | Status: DC

## 2017-02-15 MED ORDER — METHYLPREDNISOLONE 4 MG PO TBPK: ORAL_TABLET | ORAL | 0 refills | Status: DC

## 2017-02-15 NOTE — Progress Notes (Signed)
S: c/o left knee pain, no known injury, states it hurts on the outside, is more painful when she goes to sit or go upstairs, hurts to bend, no numbness or tingling, no popping or clicking, no redness or swelling, took tylenol and ibuprofen without relief, sx for over a week  O: vitals wnl, nad, left knee is tender at lateral joint line, no crepitus or clicking noted on extension, ligaments appear intact, skin without bruising or redness, calf is nontender, n/v intact  A: acute knee pain  P: medrol dose pack, voltaren gel, if not improving by Friday will refer to ortho, pt to try foam roller at home for IT band

## 2017-03-01 ENCOUNTER — Ambulatory Visit: Payer: Self-pay | Admitting: Physician Assistant

## 2017-03-01 ENCOUNTER — Encounter: Payer: Self-pay | Admitting: Physician Assistant

## 2017-03-01 VITALS — BP 120/80 | HR 108 | Temp 98.5°F

## 2017-03-01 DIAGNOSIS — J101 Influenza due to other identified influenza virus with other respiratory manifestations: Secondary | ICD-10-CM

## 2017-03-01 LAB — POCT INFLUENZA A/B
INFLUENZA B, POC: POSITIVE — AB
Influenza A, POC: NEGATIVE

## 2017-03-01 MED ORDER — OSELTAMIVIR PHOSPHATE 75 MG PO CAPS: 75.0000 mg | ORAL_CAPSULE | Freq: Two times a day (BID) | ORAL | 0 refills | Status: DC

## 2017-03-01 MED ORDER — PSEUDOEPH-BROMPHEN-DM 30-2-10 MG/5ML PO SYRP: 5.0000 mL | ORAL_SOLUTION | Freq: Four times a day (QID) | ORAL | 0 refills | Status: DC | PRN

## 2017-03-01 NOTE — Progress Notes (Signed)
S: C/o sore throat and congestion with dry cough for 1 days, + fever, chills, denies cp/sob, v/d; several coworkers have been dx with influenza B  Using otc meds:   O: PE: vitals wnl, nad,  perrl eomi, normocephalic, tms dull, nasal mucosa red and swollen, throat injected, neck supple no lymph, lungs c t a, cv rrr, neuro intact, flu swab +B  A:  Acute influenza B  P: drink fluids, continue regular meds , use otc meds of choice, return if not improving in 5 days, return earlier if worsening , tamiflu 75mg  bid, bromfed cough syrup, rx for pt's husband Ciella Obi , tamiflu 75mg  qd x 10d, pt to call her children's pediatrician as both children have hx of asthma and pneumonia

## 2017-03-02 ENCOUNTER — Other Ambulatory Visit: Payer: Self-pay | Admitting: Physician Assistant

## 2017-03-03 NOTE — Telephone Encounter (Signed)
Med refill for vit d, changed to 90 days supply

## 2017-04-03 ENCOUNTER — Other Ambulatory Visit: Payer: Self-pay | Admitting: Physician Assistant

## 2017-04-04 NOTE — Telephone Encounter (Signed)
Med refill for singulair approved 

## 2017-05-02 ENCOUNTER — Ambulatory Visit: Payer: Self-pay | Admitting: Physician Assistant

## 2017-05-02 ENCOUNTER — Encounter: Payer: Self-pay | Admitting: Physician Assistant

## 2017-05-02 VITALS — BP 135/90 | HR 83 | Temp 98.5°F | Ht 62.0 in | Wt 330.0 lb

## 2017-05-02 DIAGNOSIS — Z0000 Encounter for general adult medical examination without abnormal findings: Secondary | ICD-10-CM

## 2017-05-02 MED ORDER — MONTELUKAST SODIUM 10 MG PO TABS: ORAL_TABLET | ORAL | 3 refills | Status: DC

## 2017-05-02 MED ORDER — VITAMIN D (ERGOCALCIFEROL) 1.25 MG (50000 UNIT) PO CAPS: 50000.0000 [IU] | ORAL_CAPSULE | ORAL | 3 refills | Status: DC

## 2017-05-02 MED ORDER — LEVOCETIRIZINE DIHYDROCHLORIDE 5 MG PO TABS: 5.0000 mg | ORAL_TABLET | Freq: Every day | ORAL | 3 refills | Status: DC

## 2017-05-02 NOTE — Progress Notes (Signed)
135/90

## 2017-05-02 NOTE — Addendum Note (Signed)
Addended by: Rudene Anda T on: 05/02/2017 02:51 PM   Modules accepted: Orders

## 2017-05-02 NOTE — Progress Notes (Signed)
S: pt here for wellness physical had biometrics for insurance purposes done at work, no complaints ros neg. PMH:  As noted on chart  Social: former smoker, no etoh or drugs  Fam: htn, thyroid issues, cirrhosis, vascular disease  O: vitals wnl, nad, ENT wnl, neck supple no lymph, lungs c t a, cv rrr, abd soft nontender bs normal all 4 quads  A: wellness physical  P:  Refill on meds for 90 d supply , return in September for physical and biometrics

## 2017-06-02 ENCOUNTER — Other Ambulatory Visit: Payer: Self-pay

## 2017-06-02 DIAGNOSIS — Z299 Encounter for prophylactic measures, unspecified: Secondary | ICD-10-CM

## 2017-06-02 NOTE — Progress Notes (Signed)
Patient came in to have blood drawn for testing per Susan's authorization. 

## 2017-06-03 LAB — HEPATITIS B SURFACE ANTIBODY,QUALITATIVE: HEP B SURFACE AB, QUAL: NONREACTIVE

## 2017-08-28 ENCOUNTER — Other Ambulatory Visit: Payer: Self-pay | Admitting: Physician Assistant

## 2017-09-05 ENCOUNTER — Ambulatory Visit: Payer: Self-pay | Admitting: Physician Assistant

## 2017-09-05 ENCOUNTER — Encounter: Payer: Self-pay | Admitting: Physician Assistant

## 2017-09-05 VITALS — BP 119/70 | HR 86 | Temp 98.5°F | Resp 16 | Ht 62.0 in | Wt 328.0 lb

## 2017-09-05 DIAGNOSIS — Z0000 Encounter for general adult medical examination without abnormal findings: Secondary | ICD-10-CM

## 2017-09-05 DIAGNOSIS — R109 Unspecified abdominal pain: Secondary | ICD-10-CM

## 2017-09-05 DIAGNOSIS — Z008 Encounter for other general examination: Secondary | ICD-10-CM

## 2017-09-05 DIAGNOSIS — R3129 Other microscopic hematuria: Secondary | ICD-10-CM

## 2017-09-05 DIAGNOSIS — Z0189 Encounter for other specified special examinations: Principal | ICD-10-CM

## 2017-09-05 LAB — POCT URINALYSIS DIPSTICK
BILIRUBIN UA: NEGATIVE
GLUCOSE UA: NEGATIVE
Ketones, UA: NEGATIVE
Leukocytes, UA: NEGATIVE
Nitrite, UA: NEGATIVE
Protein, UA: NEGATIVE
SPEC GRAV UA: 1.025 (ref 1.010–1.025)
Urobilinogen, UA: 0.2 E.U./dL
pH, UA: 6 (ref 5.0–8.0)

## 2017-09-05 MED ORDER — KETOROLAC TROMETHAMINE 10 MG PO TABS: 10.0000 mg | ORAL_TABLET | Freq: Four times a day (QID) | ORAL | 0 refills | Status: DC | PRN

## 2017-09-05 NOTE — Progress Notes (Signed)
S: pt here for wellness physical and biometrics for insurance purposes, some mid back pain, worried she has a uti, no fever/chills/ no othercomplaints ros neg. PMH:   Asthma, psoriasis, allergies; vit d def;  Kidney stones; Social: former smoker Fam:as noted on chart  O: vitals wnl, nad,   Ent:  tms clear, nasal mucosa pink, throat wnl, neck supple no lymph Lungs: Normal effort. Lungs are clear to auscultation, no crackles or wheezes Cardiovascular: Regular rate and rhythm, no edema Abd: soft nontender bs normal all 4 quads Musculoskeletal:  Neurovascularly intact Neurological: No new neurological deficits  A: wellness, flank pain, biometrics, ?kidney stones  P: urine culture, labs, toradol 10mg  #20 nr

## 2017-09-06 LAB — CMP12+LP+TP+TSH+6AC+CBC/D/PLT
ALBUMIN: 3.9 g/dL (ref 3.5–5.5)
ALT: 11 IU/L (ref 0–32)
AST: 23 IU/L (ref 0–40)
Albumin/Globulin Ratio: 1.2 (ref 1.2–2.2)
Alkaline Phosphatase: 104 IU/L (ref 39–117)
BASOS: 1 %
BUN/Creatinine Ratio: 15 (ref 9–23)
BUN: 11 mg/dL (ref 6–24)
Basophils Absolute: 0.1 10*3/uL (ref 0.0–0.2)
Bilirubin Total: 0.8 mg/dL (ref 0.0–1.2)
CALCIUM: 9 mg/dL (ref 8.7–10.2)
CHOL/HDL RATIO: 3.5 ratio (ref 0.0–4.4)
CHOLESTEROL TOTAL: 153 mg/dL (ref 100–199)
CREATININE: 0.71 mg/dL (ref 0.57–1.00)
Chloride: 101 mmol/L (ref 96–106)
EOS (ABSOLUTE): 0.2 10*3/uL (ref 0.0–0.4)
ESTIMATED CHD RISK: 0.6 times avg. (ref 0.0–1.0)
Eos: 2 %
Free Thyroxine Index: 2.3 (ref 1.2–4.9)
GFR calc Af Amer: 122 mL/min/{1.73_m2} (ref >59)
GFR, EST NON AFRICAN AMERICAN: 106 mL/min/{1.73_m2} (ref >59)
GGT: 13 IU/L (ref 0–60)
Globulin, Total: 3.2 g/dL (ref 1.5–4.5)
Glucose: 97 mg/dL (ref 65–99)
HDL: 44 mg/dL (ref >39)
Hematocrit: 44.4 % (ref 34.0–46.6)
Hemoglobin: 14.5 g/dL (ref 11.1–15.9)
Immature Grans (Abs): 0 10*3/uL (ref 0.0–0.1)
Immature Granulocytes: 0 %
Iron: 81 ug/dL (ref 27–159)
LDH: 210 IU/L (ref 119–226)
LDL Calculated: 94 mg/dL (ref 0–99)
LYMPHS ABS: 3.1 10*3/uL (ref 0.7–3.1)
LYMPHS: 32 %
MCH: 28.3 pg (ref 26.6–33.0)
MCHC: 32.7 g/dL (ref 31.5–35.7)
MCV: 87 fL (ref 79–97)
MONOS ABS: 0.7 10*3/uL (ref 0.1–0.9)
Monocytes: 8 %
NEUTROS ABS: 5.7 10*3/uL (ref 1.4–7.0)
Neutrophils: 57 %
PHOSPHORUS: 3.2 mg/dL (ref 2.5–4.5)
Platelets: 230 10*3/uL (ref 150–379)
Potassium: 4.1 mmol/L (ref 3.5–5.2)
RBC: 5.13 x10E6/uL (ref 3.77–5.28)
RDW: 13.9 % (ref 12.3–15.4)
SODIUM: 140 mmol/L (ref 134–144)
T3 Uptake Ratio: 25 % (ref 24–39)
T4 TOTAL: 9 ug/dL (ref 4.5–12.0)
TSH: 3.61 u[IU]/mL (ref 0.450–4.500)
Total Protein: 7.1 g/dL (ref 6.0–8.5)
Triglycerides: 76 mg/dL (ref 0–149)
Uric Acid: 5.2 mg/dL (ref 2.5–7.1)
VLDL Cholesterol Cal: 15 mg/dL (ref 5–40)
WBC: 9.8 10*3/uL (ref 3.4–10.8)

## 2017-09-06 LAB — HGB A1C W/O EAG: HEMOGLOBIN A1C: 6 % — AB (ref 4.8–5.6)

## 2017-09-06 LAB — VITAMIN D 25 HYDROXY (VIT D DEFICIENCY, FRACTURES): VIT D 25 HYDROXY: 40.3 ng/mL (ref 30.0–100.0)

## 2017-09-08 LAB — URINE CULTURE

## 2017-09-13 ENCOUNTER — Ambulatory Visit
Admission: EM | Admit: 2017-09-13 | Discharge: 2017-09-13 | Disposition: A | Discharge status: Home / Self Care | Payer: Managed Care, Other (non HMO) | Attending: Family Medicine | Admitting: Family Medicine

## 2017-09-13 DIAGNOSIS — R319 Hematuria, unspecified: Secondary | ICD-10-CM | POA: Diagnosis not present

## 2017-09-13 DIAGNOSIS — R300 Dysuria: Secondary | ICD-10-CM

## 2017-09-13 LAB — URINALYSIS, COMPLETE (UACMP) WITH MICROSCOPIC
BILIRUBIN URINE: NEGATIVE
Bacteria, UA: NONE SEEN
Glucose, UA: NEGATIVE mg/dL
Ketones, ur: NEGATIVE mg/dL
Leukocytes, UA: NEGATIVE
NITRITE: NEGATIVE
PROTEIN: NEGATIVE mg/dL
SPECIFIC GRAVITY, URINE: 1.01 (ref 1.005–1.030)
pH: 6.5 (ref 5.0–8.0)

## 2017-09-13 MED ORDER — NITROFURANTOIN MONOHYD MACRO 100 MG PO CAPS: 100.0000 mg | ORAL_CAPSULE | Freq: Two times a day (BID) | ORAL | 0 refills | Status: DC

## 2017-09-13 NOTE — Discharge Instructions (Signed)
Antibiotic as prescribed while we await culture.  Take care  Dr. Lacinda Axon

## 2017-09-13 NOTE — ED Provider Notes (Signed)
[facility]    CSN: [id number] Arrival date & time: 09/13/17  8416  History   Chief Complaint Chief Complaint  Patient presents with  . Hematuria   HPI 41 year old female presents with complaints of hematuria, urgency, frequency, and dysuria.  Patient reports that she was seen on 9/24 for a wellness examination. She had blood in her urine at that time. She states that since then she's had urinary frequency, urgency, dysuria. No flank pain. No fever. No known exacerbating or relieving factors. No gross hematuria. No other associated symptoms. No other complaints or concerns at this time.  Past Medical History:  Diagnosis Date  . Allergy   . Asthma   . Obesity   . Psoriasis     Patient Active Problem List   Diagnosis Date Noted  . Plantar fascial fibromatosis 09/26/2013    Past Surgical History:  Procedure Laterality Date  . CESAREAN SECTION     X 2  . HAND SURGERY Right    THUMB  . HERNIA REPAIR      OB History    No data available       Home Medications    Prior to Admission medications   Medication Sig Start Date End Date Taking? Authorizing Provider  albuterol (PROVENTIL) (2.5 MG/3ML) 0.083% nebulizer solution Take 3 mLs (2.5 mg total) by nebulization every 6 (six) hours as needed for wheezing or shortness of breath. 11/14/15  Yes Fisher, Linden Dolin, PA-C  budesonide (PULMICORT) 1 MG/2ML nebulizer solution Take 2 mLs (1 mg total) by nebulization daily. 04/06/16  Yes Fisher, Linden Dolin, PA-C  ketorolac (TORADOL) 10 MG tablet Take 1 tablet (10 mg total) by mouth every 6 (six) hours as needed. 09/05/17  Yes Fisher, Linden Dolin, PA-C  levocetirizine (XYZAL) 5 MG tablet Take 1 tablet (5 mg total) by mouth daily. 05/02/17  Yes Fisher, Linden Dolin, PA-C  montelukast (SINGULAIR) 10 MG tablet TAKE 1 TABLET (10 MG TOTAL) BY MOUTH AT BEDTIME. 05/02/17  Yes Fisher, Linden Dolin, PA-C  Vitamin D, Ergocalciferol, (DRISDOL) 50000 units CAPS capsule Take 1 capsule (50,000 Units total) by  mouth once a week. 05/02/17  Yes Fisher, Linden Dolin, PA-C  nitrofurantoin, macrocrystal-monohydrate, (MACROBID) 100 MG capsule Take 1 capsule (100 mg total) by mouth 2 (two) times daily. 09/13/17   Coral Spikes, DO    Family History Family History  Problem Relation Age of Onset  . Hypertension Mother   . Cirrhosis Father     Social History Social History  Substance Use Topics  . Smoking status: Former Research scientist (life sciences)  . Smokeless tobacco: Never Used  . Alcohol use No     Allergies   Biaxin [clarithromycin]   Review of Systems Review of Systems  Constitutional: Negative for fever.  Gastrointestinal: Negative.   Genitourinary: Positive for dysuria, frequency and urgency. Negative for flank pain.   Physical Exam Triage Vital Signs ED Triage Vitals [09/13/17 1014]  Enc Vitals Group     BP (!) 123/94     Pulse Rate 82     Resp 16     Temp 98.2 F (36.8 C)     Temp Source Oral     SpO2 100 %     Weight (!) 328 lb (148.8 kg)     Height 5\' 2"  (1.575 m)     Head Circumference      Peak Flow      Pain Score 5     Pain Loc      Pain  Edu?      Excl. in [place]?    Updated Vital Signs BP (!) 123/94 (BP Location: Left Arm)   Pulse 82   Temp 98.2 F (36.8 C) (Oral)   Resp 16   Ht 5\' 2"  (1.575 m)   Wt (!) 328 lb (148.8 kg)   SpO2 100%   BMI 59.99 kg/m  Physical Exam  Constitutional: She is oriented to person, place, and time. She appears well-developed. No distress.  Cardiovascular: Normal rate and regular rhythm.   Pulmonary/Chest: Effort normal and breath sounds normal.  Abdominal: Soft. She exhibits no distension and no mass. There is no tenderness. There is no guarding.  Neurological: She is alert and oriented to person, place, and time.  Psychiatric: She has a normal mood and affect.  Vitals reviewed.  UC Treatments / Results  Labs (all labs ordered are listed, but only abnormal results are displayed) Labs Reviewed  URINALYSIS, COMPLETE (UACMP) WITH MICROSCOPIC -  Abnormal; Notable for the following:       Result Value   Hgb urine dipstick TRACE (*)    Squamous Epithelial / LPF 0-5 (*)    All other components within normal limits  URINE CULTURE    EKG  EKG Interpretation None       Radiology No results found.  Procedures Procedures (including critical care time)  Medications Ordered in UC Medications - No data to display   Initial Impression / Assessment and Plan / UC Course  I have reviewed the triage vital signs and the nursing notes.  Pertinent labs & imaging results that were available during my care of the patient were reviewed by me and considered in my medical decision making (see chart for details).   41 year old female presents with urgency, frequency, reports of recent hematuria. Also has dysuria. UA was trace blood. Given her symptoms, treating empirically for UTI while awaiting culture.  Final Clinical Impressions(s) / UC Diagnoses   Final diagnoses:  Dysuria    New Prescriptions Discharge Medication List as of 09/13/2017 11:03 AM    START taking these medications   Details  nitrofurantoin, macrocrystal-monohydrate, (MACROBID) 100 MG capsule Take 1 capsule (100 mg total) by mouth 2 (two) times daily., Starting Tue 09/13/2017, Normal       Controlled Substance Prescriptions [place] Controlled Substance Registry consulted? Not Applicable   Coral Spikes, DO 09/13/17 1118

## 2017-09-13 NOTE — ED Triage Notes (Signed)
Patient complains of hematuria, urinary frequency that started on last Monday. Patient saw Employee health and wellness on Monday and she was given Toradol. Patient states that her symptoms worsened on Saturday.

## 2017-10-04 ENCOUNTER — Other Ambulatory Visit: Payer: Self-pay | Admitting: Emergency Medicine

## 2017-10-04 MED ORDER — LEVOCETIRIZINE DIHYDROCHLORIDE 5 MG PO TABS: 5.0000 mg | ORAL_TABLET | Freq: Every day | ORAL | 3 refills | Status: DC

## 2017-10-04 NOTE — Telephone Encounter (Signed)
spreadsheet

## 2017-10-04 NOTE — Telephone Encounter (Signed)
Med refill for xyzal approved

## 2017-10-05 ENCOUNTER — Ambulatory Visit: Payer: Self-pay | Admitting: Physician Assistant

## 2017-10-05 DIAGNOSIS — Z299 Encounter for prophylactic measures, unspecified: Secondary | ICD-10-CM

## 2017-10-05 NOTE — Progress Notes (Signed)
Patient came in to get her scheduled 2nd hep b vaccination.

## 2018-01-24 ENCOUNTER — Ambulatory Visit: Payer: Self-pay | Admitting: Emergency Medicine

## 2018-01-24 VITALS — BP 140/90 | HR 101 | Temp 98.8°F | Resp 16

## 2018-01-24 DIAGNOSIS — J209 Acute bronchitis, unspecified: Secondary | ICD-10-CM

## 2018-01-24 MED ORDER — DEXAMETHASONE SODIUM PHOSPHATE 10 MG/ML IJ SOLN
10.0000 mg | Freq: Once | INTRAMUSCULAR | Status: AC
  Administered 2018-01-24: 10 mg via INTRAMUSCULAR

## 2018-01-24 MED ORDER — BENZONATATE 200 MG PO CAPS
ORAL_CAPSULE | ORAL | 0 refills | Status: DC
Start: 2018-01-24 — End: 2020-04-28

## 2018-01-24 MED ORDER — ALBUTEROL SULFATE HFA 108 (90 BASE) MCG/ACT IN AERS: 2.0000 | INHALATION_SPRAY | RESPIRATORY_TRACT | 0 refills | Status: DC | PRN

## 2018-01-24 MED ORDER — LEVOFLOXACIN 500 MG PO TABS: ORAL_TABLET | ORAL | 0 refills | Status: DC

## 2018-01-24 NOTE — Patient Instructions (Signed)
Today, we gave you a shot of Decadron which is a type of cortisone shot. To help with wheezing and cough and inflammation. For antibiotic coverage, take the Levaquin as prescribed. Tessalon Perles, one every 8 hours as needed for cough. Albuterol rescue inhaler, 2 inhalations every 4-6 hours as needed for wheezing, but this is not for every day long-term use. Follow-up here or with your PCP if not improving in one week, sooner if worse. If severe worsening symptoms, go to emergency room.

## 2018-01-24 NOTE — Progress Notes (Signed)
Subjective: 4 days of sinus congestion, progressed to cough productive of yellow sputum, with hacking cough that keeps her up at night, and occasional wheezing. No definite shortness of breath. She states she gets bronchitis with wheezing about once a year, but no diagnosis of asthma. She takes Xyzal and Singulair daily, as chronic treatment for allergies. She's run out of her albuterol rescue inhaler. May have had a low-grade fever, but she is not sure. She denies chills or sweats or syncope or chest pain or shortness of breath or nausea or vomiting or abdominal pain. She denies chance of pregnancy.  Objective: In general, alert, cooperative, no acute cardiorespiratory distress, but she has hacking nonproductive cough. BP 140/90, pulse by me 96, regular. Temp 98.8. Respirations 16 Pulse ox 97% on room air. HEENT: TMs with minimal air-fluid levels but no redness. Nose, boggy turbinates with seromucoid drainage. Pharynx injected without tonsillar enlargement or exudate or airway intact. Neck: Supple, nontender no adenopathy Heart, regular rate and rhythm Lungs: Few scattered rhonchi anteriorly and occasional late expiratory wheeze on forced expiration only. Breath sounds equal bilaterally.  Assessment: Acute bronchitis with bronchospasm  Plans: Risks, benefits, alternatives discussed Corticosteroid options discussed. She declined any oral corticosteroid and prefers shot of Decadron. Levaquin for antibiotic coverage. Tessalon Perles. Albuterol rescue inhaler refilled. An After Visit Summary was printed and given to the patient.  "Today, we gave you a shot of Decadron which is a type of cortisone shot. To help with wheezing and cough and inflammation. For antibiotic coverage, take the Levaquin as prescribed. Tessalon Perles, one every 8 hours as needed for cough. Albuterol rescue inhaler, 2 inhalations every 4-6 hours as needed for wheezing, but this is not for every day long-term  use. Follow-up here or with your PCP if not improving in one week, sooner if worse. If severe worsening symptoms, go to emergency room."  Precautions discussed. Red flags discussed. Questions invited and answered. Patient voiced understanding and agreement.

## 2018-03-06 ENCOUNTER — Other Ambulatory Visit: Payer: Self-pay

## 2018-04-06 ENCOUNTER — Other Ambulatory Visit: Payer: Self-pay

## 2018-04-06 DIAGNOSIS — Z299 Encounter for prophylactic measures, unspecified: Secondary | ICD-10-CM

## 2018-04-06 NOTE — Addendum Note (Signed)
Addended by: Carlene Coria on: 04/06/2018 08:40 AM   Modules accepted: Level of Service

## 2018-04-06 NOTE — Addendum Note (Signed)
Addended by: Carlene Coria on: 04/06/2018 10:02 AM   Modules accepted: Orders

## 2018-04-07 LAB — HEPATITIS B SURFACE ANTIBODY,QUALITATIVE: HEP B SURFACE AB, QUAL: REACTIVE

## 2018-04-10 NOTE — Progress Notes (Signed)
Dear Amy [name], I wanted to let you know that your hepatitis B surface antibody test came back reactive, consistent with immunity against hepatitis B.

## 2019-06-25 ENCOUNTER — Ambulatory Visit: Payer: Managed Care, Other (non HMO) | Admitting: Adult Health

## 2019-06-25 ENCOUNTER — Other Ambulatory Visit: Payer: Self-pay

## 2019-06-25 ENCOUNTER — Encounter: Payer: Self-pay | Admitting: Adult Health

## 2019-06-25 VITALS — BP 128/80 | HR 90 | Temp 98.4°F | Resp 18 | Ht 62.0 in | Wt 336.0 lb

## 2019-06-25 DIAGNOSIS — Z008 Encounter for other general examination: Secondary | ICD-10-CM | POA: Diagnosis not present

## 2019-06-25 DIAGNOSIS — Z0189 Encounter for other specified special examinations: Secondary | ICD-10-CM

## 2019-06-25 NOTE — Progress Notes (Addendum)
[place] DOB: 43 y.o. MRN: [id number]  Subjective:  Here for Biometric Screen/brief exam  She denies any health concerns at this time. She reports she works from home for [employer] at this point due to [place] [employer] and she enjoys this. She has 13 acres, walks there as exercise.  Diet she admits could use room for improvement.   Patient  denies any fever, body aches,chills, rash, chest pain, shortness of breath, nausea, vomiting, or diarrhea.   Objective: NAD. Obese. Patient is alert and oriented and responsive to questions Engages in eye contact with provider. Speaks in full sentences without any pauses without any shortness of breath or distress.   HEENT: Within normal limits Neck: Normal, supple,no lymphadenopathy.  Heart: Regular rate and rhythm Lungs: Clear to ausculation no adventitious lung sounds.  Patient moves on and off of exam table and in room without difficulty. Gait is normal in hall and in room.  Assessment: Biometric screen   Plan: Importance of weight loss and agressive dietary and lifestyle changes discussed. Discussed need for finding a primary care provider and resources given, she needs mammogram and pap smear. She will call the office back if she needs help getting in with a primary care for health maintenance and other labs.  Fasting glucose and lipids. Discussed with patient that today's visit here is a limited biometric screening visit (not a comprehensive exam or management of any chronic problems) Discussed some health issues, including healthy eating habits and exercise. Encouraged to follow-up with PCP for annual comprehensive preventive and wellness care (and if applicable, any chronic issues). Questions invited and answered.   I will have the office call you on your glucose and cholesterol results when they return if you have not heard within 1 week please call the office.  This  biometric physical is a brief physical and the only labs done are glucose and your lipid panel(cholesterol) and is  not a substitute for seeing a primary care provider for a complete annual physical. Please see a primary care physician for routine health maintenance, labs and full physical at least yearly and follow up as recommended by your provider. Provider also recommends if you do not have a primary care provider for patient to establish care as soon as possible .Patient may chose provider of choice. Also gave the [place] at [phone] or web site at [email or web address] to help assist with finding a primary care doctor.  Patient verbalizes understanding that his office is acute care only and not a substitute for a primary care or for the management of chronic conditions.

## 2019-06-25 NOTE — Patient Instructions (Signed)
The Biometric exam is a brief physical with labs including glucose and cholesterol. This does not replace a full physical with a primary care provider, and additional recommended labs. This is an acute care clinic not for maintenance of chronic or long standing conditions.  ° °Provider also recommends if you do not have a primary care provider for patient to establish care promptly.You can choose any provider of your choice at any facility of your choice, the below information is  just a resource to aid in you finding a primary care provider for routine health maintenance.  ° °[facility]  PHYSICIAN/PROVIDER  REFERRAL LINE at 1-800-449- 8688  °[facility] to help assist with finding a primary care doctor.  ° °Helpful resources below of other primary care office's accepting new patients.  ° °• [facility]  °       [address]  °[place] [address] °[phone] ° °• [facility]  °  [address], [address] °[place], [place] [address] °[phone] ° °• [facility] °[address]  °[place], [place]  °[phone]  ° °• [facility]  °[address], [address] °[place] [address] °[phone]  ° ° °Follow up with primary care as needed for chronic and maintenance health care- can be seen in this employee clinic for acute care.   ° °Health Maintenance, Female °Adopting a healthy lifestyle and getting preventive care are important in promoting health and wellness. Ask your health care provider about: °· The right schedule for you to have regular tests and exams. °· Things you can do on your own to prevent diseases and keep yourself healthy. °What should I know about diet, weight, and exercise? °Eat a healthy diet ° °· Eat a diet that includes plenty of vegetables, fruits, low-fat dairy products, and lean protein. °· Do not eat a lot of foods that are high in solid fats, added sugars, or  sodium. °Maintain a healthy weight °Body mass index (BMI) is used to identify weight problems. It estimates body fat based on height and weight. Your health care provider can help determine your BMI and help you achieve or maintain a healthy weight. °Get regular exercise °Get regular exercise. This is one of the most important things you can do for your health. Most adults should: °· Exercise for at least 150 minutes each week. The exercise should increase your heart rate and make you sweat (moderate-intensity exercise). °· Do strengthening exercises at least twice a week. This is in addition to the moderate-intensity exercise. °· Spend less time sitting. Even light physical activity can be beneficial. °Watch cholesterol and blood lipids °Have your blood tested for lipids and cholesterol at 43 years of age, then have this test every 5 years. °Have your cholesterol levels checked more often if: °· Your lipid or cholesterol levels are high. °· You are older than 43 years of age. °· You are at high risk for heart disease. °What should I know about cancer screening? °Depending on your health history and family history, you may need to have cancer screening at various ages. This may include screening for: °· Breast cancer. °· Cervical cancer. °· Colorectal cancer. °· Skin cancer. °· Lung cancer. °What should I know about heart disease, diabetes, and high blood pressure? °Blood pressure and heart disease °· High blood pressure causes heart disease and increases the risk of stroke. This is more likely to develop in people   who have high blood pressure readings, are of African descent, or are overweight. °· Have your blood pressure checked: °? Every 3-5 years if you are 18-39 years of age. °? Every year if you are 40 years old or older. °Diabetes °Have regular diabetes screenings. This checks your fasting blood sugar level. Have the screening done: °· Once every three years after age 40 if you are at a normal weight and have  a low risk for diabetes. °· More often and at a younger age if you are overweight or have a high risk for diabetes. °What should I know about preventing infection? °Hepatitis B °If you have a higher risk for hepatitis B, you should be screened for this virus. Talk with your health care provider to find out if you are at risk for hepatitis B infection. °Hepatitis C °Testing is recommended for: °· Everyone born from 1945 through 1965. °· Anyone with known risk factors for hepatitis C. °Sexually transmitted infections (STIs) °· Get screened for STIs, including gonorrhea and chlamydia, if: °? You are sexually active and are younger than 43 years of age. °? You are older than 43 years of age and your health care provider tells you that you are at risk for this type of infection. °? Your sexual activity has changed since you were last screened, and you are at increased risk for chlamydia or gonorrhea. Ask your health care provider if you are at risk. °· Ask your health care provider about whether you are at high risk for HIV. Your health care provider may recommend a prescription medicine to help prevent HIV infection. If you choose to take medicine to prevent HIV, you should first get tested for HIV. You should then be tested every 3 months for as long as you are taking the medicine. °Pregnancy °· If you are about to stop having your period (premenopausal) and you may become pregnant, seek counseling before you get pregnant. °· Take 400 to 800 micrograms (mcg) of folic acid every day if you become pregnant. °· Ask for birth control (contraception) if you want to prevent pregnancy. °Osteoporosis and menopause °Osteoporosis is a disease in which the bones lose minerals and strength with aging. This can result in bone fractures. If you are 65 years old or older, or if you are at risk for osteoporosis and fractures, ask your health care provider if you should: °· Be screened for bone loss. °· Take a calcium or vitamin D  supplement to lower your risk of fractures. °· Be given hormone replacement therapy (HRT) to treat symptoms of menopause. °Follow these instructions at home: °Lifestyle °· Do not use any products that contain nicotine or tobacco, such as cigarettes, e-cigarettes, and chewing tobacco. If you need help quitting, ask your health care provider. °· Do not use street drugs. °· Do not share needles. °· Ask your health care provider for help if you need support or information about quitting drugs. °Alcohol use °· Do not drink alcohol if: °? Your health care provider tells you not to drink. °? You are pregnant, may be pregnant, or are planning to become pregnant. °· If you drink alcohol: °? Limit how much you use to 0-1 drink a day. °? Limit intake if you are breastfeeding. °· Be aware of how much alcohol is in your drink. In the [place] one drink equals one 12 oz bottle of beer (355 mL), one 5 oz glass of wine (148 mL), or one 1½ oz glass of hard liquor (  44 mL). °General instructions °· Schedule regular health, dental, and eye exams. °· Stay current with your vaccines. °· Tell your health care provider if: °? You often feel depressed. °? You have ever been abused or do not feel safe at home. °Summary °· Adopting a healthy lifestyle and getting preventive care are important in promoting health and wellness. °· Follow your health care provider's instructions about healthy diet, exercising, and getting tested or screened for diseases. °· Follow your health care provider's instructions on monitoring your cholesterol and blood pressure. °This information is not intended to replace advice given to you by your health care provider. Make sure you discuss any questions you have with your health care provider. °Document Released: 06/14/2011 Document Revised: 11/22/2018 Document Reviewed: 11/22/2018 °Elsevier Patient Education © 2020 [employer] Inc. ° °

## 2019-06-26 ENCOUNTER — Encounter: Payer: Self-pay | Admitting: Adult Health

## 2019-06-26 LAB — LIPID PANEL WITH LDL/HDL RATIO
Cholesterol, Total: 163 mg/dL (ref 100–199)
HDL: 43 mg/dL (ref >39)
LDL Calculated: 105 mg/dL — ABNORMAL HIGH (ref 0–99)
LDl/HDL Ratio: 2.4 ratio (ref 0.0–3.2)
Triglycerides: 74 mg/dL (ref 0–149)
VLDL Cholesterol Cal: 15 mg/dL (ref 5–40)

## 2019-06-26 LAB — GLUCOSE, RANDOM: Glucose: 101 mg/dL — ABNORMAL HIGH (ref 65–99)

## 2020-04-28 ENCOUNTER — Ambulatory Visit (INDEPENDENT_AMBULATORY_CARE_PROVIDER_SITE_OTHER): Payer: Managed Care, Other (non HMO) | Admitting: Family Medicine

## 2020-04-28 ENCOUNTER — Encounter: Payer: Self-pay | Admitting: Family Medicine

## 2020-04-28 ENCOUNTER — Other Ambulatory Visit: Payer: Self-pay

## 2020-04-28 VITALS — BP 144/79 | HR 75 | Temp 98.1°F | Ht 61.0 in | Wt 343.0 lb

## 2020-04-28 DIAGNOSIS — R7301 Impaired fasting glucose: Secondary | ICD-10-CM | POA: Diagnosis not present

## 2020-04-28 DIAGNOSIS — Z7689 Persons encountering health services in other specified circumstances: Secondary | ICD-10-CM | POA: Diagnosis not present

## 2020-04-28 DIAGNOSIS — L409 Psoriasis, unspecified: Secondary | ICD-10-CM

## 2020-04-28 DIAGNOSIS — E7800 Pure hypercholesterolemia, unspecified: Secondary | ICD-10-CM | POA: Diagnosis not present

## 2020-04-28 DIAGNOSIS — E1169 Type 2 diabetes mellitus with other specified complication: Secondary | ICD-10-CM | POA: Insufficient documentation

## 2020-04-28 DIAGNOSIS — E785 Hyperlipidemia, unspecified: Secondary | ICD-10-CM | POA: Insufficient documentation

## 2020-04-28 DIAGNOSIS — Z6841 Body Mass Index (BMI) 40.0 and over, adult: Secondary | ICD-10-CM

## 2020-04-28 DIAGNOSIS — E559 Vitamin D deficiency, unspecified: Secondary | ICD-10-CM

## 2020-04-28 MED ORDER — LEVOCETIRIZINE DIHYDROCHLORIDE 5 MG PO TABS: 5.0000 mg | ORAL_TABLET | Freq: Every day | ORAL | 3 refills | Status: DC

## 2020-04-28 MED ORDER — CLOBETASOL PROPIONATE 0.05 % EX OINT: 1.0000 "application " | TOPICAL_OINTMENT | Freq: Two times a day (BID) | CUTANEOUS | 1 refills | Status: DC

## 2020-04-28 MED ORDER — VITAMIN D (ERGOCALCIFEROL) 1.25 MG (50000 UNIT) PO CAPS: 50000.0000 [IU] | ORAL_CAPSULE | ORAL | 3 refills | Status: DC

## 2020-04-28 NOTE — Progress Notes (Signed)
BP (!) 144/79   Pulse 75   Temp 98.1 F (36.7 C) (Oral)   Ht 5\' 1"  (1.549 m)   Wt (!) 343 lb (155.6 kg)   SpO2 98%   BMI 64.81 kg/m    Subjective:    Patient ID: Amy [name], female    DOB: 13-Jul-1976, 44 y.o.   MRN: [id number]  HPI: Amy [name] is a 44 y.o. female  Chief Complaint  Patient presents with  . Establish Care   Here today to establish care.   Tends to have psoriasis flares left elbow, always just uses OTC creams for this which keeps things manageable.   Asthma and allergic rhinitis - taking xyzal daily which keeps things at [place] for the most part. Has not needed an inhaler in a long time. Really only needs them if getting sick. Denies wheezing, cough, chest tightness, fevers.   IFG - notes she's been prediabetic for years, does not follow strict diet or exercise regularly. Never been on medications for this. Denies polyphagia, polydipsia, polyuria.   HLD - diet controlled, not strict on what she's eating. Denies CP, SOB, claudication.   Relevant past medical, surgical, family and social history reviewed and updated as indicated. Interim medical history since our last visit reviewed. Allergies and medications reviewed and updated.  Review of Systems  Per HPI unless specifically indicated above     Objective:    BP (!) 144/79   Pulse 75   Temp 98.1 F (36.7 C) (Oral)   Ht 5\' 1"  (1.549 m)   Wt (!) 343 lb (155.6 kg)   SpO2 98%   BMI 64.81 kg/m   Wt Readings from Last 3 Encounters:  04/28/20 (!) 343 lb (155.6 kg)  06/25/19 (!) 336 lb (152.4 kg)  09/13/17 (!) 328 lb (148.8 kg)    Physical Exam Vitals and nursing note reviewed.  Constitutional:      Appearance: Normal appearance. She is obese. She is not ill-appearing.  HENT:     Head: Atraumatic.  Eyes:     Extraocular Movements: Extraocular movements intact.     Conjunctiva/sclera: Conjunctivae normal.  Cardiovascular:     Rate and Rhythm: Normal rate and regular rhythm.      Heart sounds: Normal heart sounds.  Pulmonary:     Effort: Pulmonary effort is normal. No respiratory distress.     Breath sounds: Normal breath sounds. No wheezing.  Musculoskeletal:        General: Normal range of motion.     Cervical back: Normal range of motion and neck supple.  Skin:    General: Skin is warm and dry.     Findings: Rash (plaque present left elbow) present.  Neurological:     Mental Status: She is alert and oriented to person, place, and time.  Psychiatric:        Mood and Affect: Mood normal.        Thought Content: Thought content normal.        Judgment: Judgment normal.     Results for orders placed or performed in visit on 04/28/20  TSH  Result Value Ref Range   TSH 4.620 (H) 0.450 - 4.500 uIU/mL  Lipid Panel w/o Chol/HDL Ratio  Result Value Ref Range   Cholesterol, Total 165 100 - 199 mg/dL   Triglycerides 86 0 - 149 mg/dL   HDL 49 >39 mg/dL   VLDL Cholesterol Cal 16 5 - 40 mg/dL   LDL Chol Calc (NIH) 100 (H)  0 - 99 mg/dL  Comprehensive metabolic panel  Result Value Ref Range   Glucose 87 65 - 99 mg/dL   BUN 7 6 - 24 mg/dL   Creatinine, Ser 0.55 (L) 0.57 - 1.00 mg/dL   GFR calc non Af Amer 115 >59 mL/min/1.73   GFR calc Af Amer 133 >59 mL/min/1.73   BUN/Creatinine Ratio 13 9 - 23   Sodium 141 134 - 144 mmol/L   Potassium 4.0 3.5 - 5.2 mmol/L   Chloride 101 96 - 106 mmol/L   CO2 26 20 - 29 mmol/L   Calcium 9.1 8.7 - 10.2 mg/dL   Total Protein 7.0 6.0 - 8.5 g/dL   Albumin 3.9 3.8 - 4.8 g/dL   Globulin, Total 3.1 1.5 - 4.5 g/dL   Albumin/Globulin Ratio 1.3 1.2 - 2.2   Bilirubin Total 0.7 0.0 - 1.2 mg/dL   Alkaline Phosphatase 102 48 - 121 IU/L   AST 25 0 - 40 IU/L   ALT 9 0 - 32 IU/L  VITAMIN D 25 Hydroxy (Vit-D Deficiency, Fractures)  Result Value Ref Range   Vit D, 25-Hydroxy 41.3 30.0 - 100.0 ng/mL  HgB A1c  Result Value Ref Range   Hgb A1c MFr Bld 6.2 (H) 4.8 - 5.6 %   Est. average glucose Bld gHb Est-mCnc 131 mg/dL        Assessment & Plan:   Problem List Items Addressed This Visit      Endocrine   IFG (impaired fasting glucose) - Primary    Recheck A1C, adjust as needed. Work on diet and exercise changes to keep under control      Relevant Orders   Comprehensive metabolic panel (Completed)   HgB A1c (Completed)     Musculoskeletal and Integument   Psoriasis    Will start prn clobetasol ointment in addition to good moisturizer regimen        Other   Hyperlipidemia    Recheck lipids, adjust as needed. Continue working on lifestyle modifications for better control      Relevant Orders   Lipid Panel w/o Chol/HDL Ratio (Completed)   Obesity    Diet and exercise changes reviewed. Per patient fhx of thyroid disease, requesting thyroid labs      Relevant Orders   TSH (Completed)   Vitamin D deficiency    Restart vit D weekly dose, recheck levels      Relevant Orders   VITAMIN D 25 Hydroxy (Vit-D Deficiency, Fractures) (Completed)    Other Visit Diagnoses    Encounter to establish care           Follow up plan: Return in about 6 months (around 10/29/2020) for CPE.

## 2020-04-28 NOTE — Assessment & Plan Note (Signed)
Recheck A1C, adjust as needed. Work on diet and exercise changes to keep under control

## 2020-04-28 NOTE — Assessment & Plan Note (Addendum)
Diet and exercise changes reviewed. Per patient fhx of thyroid disease, requesting thyroid labs

## 2020-04-28 NOTE — Assessment & Plan Note (Signed)
Will start prn clobetasol ointment in addition to good moisturizer regimen

## 2020-04-28 NOTE — Assessment & Plan Note (Signed)
Recheck lipids, adjust as needed. Continue working on lifestyle modifications for better control 

## 2020-04-29 DIAGNOSIS — E559 Vitamin D deficiency, unspecified: Secondary | ICD-10-CM | POA: Insufficient documentation

## 2020-04-29 LAB — LIPID PANEL W/O CHOL/HDL RATIO
Cholesterol, Total: 165 mg/dL (ref 100–199)
HDL: 49 mg/dL (ref >39)
LDL Chol Calc (NIH): 100 mg/dL — ABNORMAL HIGH (ref 0–99)
Triglycerides: 86 mg/dL (ref 0–149)
VLDL Cholesterol Cal: 16 mg/dL (ref 5–40)

## 2020-04-29 LAB — COMPREHENSIVE METABOLIC PANEL
ALT: 9 IU/L (ref 0–32)
AST: 25 IU/L (ref 0–40)
Albumin/Globulin Ratio: 1.3 (ref 1.2–2.2)
Albumin: 3.9 g/dL (ref 3.8–4.8)
Alkaline Phosphatase: 102 IU/L (ref 48–121)
BUN/Creatinine Ratio: 13 (ref 9–23)
BUN: 7 mg/dL (ref 6–24)
Bilirubin Total: 0.7 mg/dL (ref 0.0–1.2)
CO2: 26 mmol/L (ref 20–29)
Calcium: 9.1 mg/dL (ref 8.7–10.2)
Chloride: 101 mmol/L (ref 96–106)
Creatinine, Ser: 0.55 mg/dL — ABNORMAL LOW (ref 0.57–1.00)
GFR calc Af Amer: 133 mL/min/{1.73_m2} (ref >59)
GFR calc non Af Amer: 115 mL/min/{1.73_m2} (ref >59)
Globulin, Total: 3.1 g/dL (ref 1.5–4.5)
Glucose: 87 mg/dL (ref 65–99)
Potassium: 4 mmol/L (ref 3.5–5.2)
Sodium: 141 mmol/L (ref 134–144)
Total Protein: 7 g/dL (ref 6.0–8.5)

## 2020-04-29 LAB — HEMOGLOBIN A1C
Est. average glucose Bld gHb Est-mCnc: 131 mg/dL
Hgb A1c MFr Bld: 6.2 % — ABNORMAL HIGH (ref 4.8–5.6)

## 2020-04-29 LAB — VITAMIN D 25 HYDROXY (VIT D DEFICIENCY, FRACTURES): Vit D, 25-Hydroxy: 41.3 ng/mL (ref 30.0–100.0)

## 2020-04-29 LAB — TSH: TSH: 4.62 u[IU]/mL — ABNORMAL HIGH (ref 0.450–4.500)

## 2020-04-29 NOTE — Assessment & Plan Note (Signed)
Restart vit D weekly dose, recheck levels

## 2020-05-07 ENCOUNTER — Telehealth: Payer: Self-pay | Admitting: Family Medicine

## 2020-05-07 DIAGNOSIS — E039 Hypothyroidism, unspecified: Secondary | ICD-10-CM

## 2020-05-07 NOTE — Telephone Encounter (Signed)
Called and left VM to return call regarding lab results. Stable overall, mild elevation in A1C but still in prediabetic range. Main question was thyroid levels show a mildly underactive thyroid - we could start a low dose of thyroid medication or continue to monitor this at this point. Please find out which she would prefer. If starting medicine, she should come by in 4-6 weeks for a lab draw to recheck thyroid levels

## 2020-05-08 MED ORDER — LEVOTHYROXINE SODIUM 25 MCG PO TABS: 25.0000 ug | ORAL_TABLET | Freq: Every day | ORAL | 1 refills | Status: DC

## 2020-05-08 NOTE — Telephone Encounter (Signed)
Called and spoke with patient. Message relayed. Patient stated she would like to start medication for her thyroid. Scheduled labs only for 06/20/20 at 8:30 am

## 2020-05-08 NOTE — Telephone Encounter (Signed)
Rx sent and future lab ordered

## 2020-05-31 ENCOUNTER — Other Ambulatory Visit: Payer: Self-pay | Admitting: Family Medicine

## 2020-05-31 NOTE — Telephone Encounter (Signed)
Requested Prescriptions  Pending Prescriptions Disp Refills   levothyroxine (SYNTHROID) 25 MCG tablet [Pharmacy Med Name: LEVOTHYROXINE 25 MCG TABLET] 30 tablet 1    Sig: TAKE 1 TABLET BY MOUTH DAILY BEFORE BREAKFAST.     Endocrinology:  Hypothyroid Agents Failed - 05/31/2020  8:30 AM      Failed - TSH needs to be rechecked within 3 months after an abnormal result. Refill until TSH is due.      Failed - TSH in normal range and within 360 days    TSH  Date Value Ref Range Status  04/28/2020 4.620 (H) 0.450 - 4.500 uIU/mL Final         Passed - Valid encounter within last 12 months    Recent Outpatient Visits          1 month ago IFG (impaired fasting glucose)   [facility], Lilia Argue, [place]      Future Appointments            In 5 months [facility], [place]ilia Argue, [place], [place]

## 2020-06-20 ENCOUNTER — Other Ambulatory Visit: Payer: Managed Care, Other (non HMO)

## 2020-06-20 ENCOUNTER — Other Ambulatory Visit: Payer: Self-pay

## 2020-06-20 DIAGNOSIS — E039 Hypothyroidism, unspecified: Secondary | ICD-10-CM

## 2020-06-21 LAB — TSH: TSH: 6.05 u[IU]/mL — ABNORMAL HIGH (ref 0.450–4.500)

## 2020-06-23 ENCOUNTER — Other Ambulatory Visit: Payer: Self-pay | Admitting: Family Medicine

## 2020-06-23 DIAGNOSIS — E039 Hypothyroidism, unspecified: Secondary | ICD-10-CM

## 2020-06-23 MED ORDER — LEVOTHYROXINE SODIUM 50 MCG PO TABS: 50.0000 ug | ORAL_TABLET | Freq: Every day | ORAL | 0 refills | Status: DC

## 2020-07-01 ENCOUNTER — Other Ambulatory Visit: Payer: Self-pay | Admitting: Family Medicine

## 2020-07-20 ENCOUNTER — Other Ambulatory Visit: Payer: Self-pay | Admitting: Family Medicine

## 2020-07-20 NOTE — Telephone Encounter (Signed)
Requested Prescriptions  Pending Prescriptions Disp Refills  . levothyroxine (SYNTHROID) 50 MCG tablet [Pharmacy Med Name: LEVOTHYROXINE 50 MCG TABLET] 90 tablet 0    Sig: TAKE 1 TABLET BY MOUTH EVERY DAY     Endocrinology:  Hypothyroid Agents Failed - 07/20/2020  1:30 PM      Failed - TSH needs to be rechecked within 3 months after an abnormal result. Refill until TSH is due.      Failed - TSH in normal range and within 360 days    TSH  Date Value Ref Range Status  06/20/2020 6.050 (H) 0.450 - 4.500 uIU/mL Final         Passed - Valid encounter within last 12 months    Recent Outpatient Visits          2 months ago IFG (impaired fasting glucose)   [facility], Lilia Argue, [place]      Future Appointments            In 3 months [facility], Lilia Argue, [place], [place]

## 2020-07-25 ENCOUNTER — Other Ambulatory Visit: Payer: Self-pay | Admitting: Family Medicine

## 2020-09-09 ENCOUNTER — Ambulatory Visit (INDEPENDENT_AMBULATORY_CARE_PROVIDER_SITE_OTHER): Payer: Managed Care, Other (non HMO) | Admitting: Family Medicine

## 2020-09-09 ENCOUNTER — Other Ambulatory Visit: Payer: Self-pay

## 2020-09-09 ENCOUNTER — Encounter: Payer: Self-pay | Admitting: Family Medicine

## 2020-09-09 VITALS — BP 136/78 | HR 80 | Temp 98.5°F | Wt 344.4 lb

## 2020-09-09 DIAGNOSIS — R252 Cramp and spasm: Secondary | ICD-10-CM

## 2020-09-09 MED ORDER — CYCLOBENZAPRINE HCL 10 MG PO TABS: 10.0000 mg | ORAL_TABLET | Freq: Every day | ORAL | 0 refills | Status: DC

## 2020-09-09 NOTE — Progress Notes (Signed)
BP 136/78    Pulse 80    Temp 98.5 F (36.9 C) (Oral)    Wt (!) 344 lb 6.4 oz (156.2 kg)    LMP  (LMP Unknown)    SpO2 98%    BMI 65.07 kg/m    Subjective:    Patient ID: Amy [name], female    DOB: 17-Mar-1976, 44 y.o.   MRN: [id number]  HPI: Amy [name] is a 44 y.o. female  Chief Complaint  Patient presents with   Leg Pain    L calf pain for the past few weeks, states she had a cramp a few weeks ago and the pain will not go away from it. States it would only hurt when she would walk,but now it is hurting all the time    LEG CRAMPS- went on a plane for a long time and was riding in a car, but was also walking a bit more with a trip Duration: few weeks Pain: yes Severity: moderate  Quality:  cramp Location: L calf  Bilateral:  no Onset: gradual Frequency: intermittent Time of  day:   at random Sudden unintentional leg jerking:   no Paresthesias:   no Decreased sensation:  no Weakness:   no Insomnia:   no Fatigue:   no Status: worse Treatments attempted: motrin, tylenol, biofreeze  Relevant past medical, surgical, family and social history reviewed and updated as indicated. Interim medical history since our last visit reviewed. Allergies and medications reviewed and updated.  Review of Systems  Constitutional: Negative.   Respiratory: Negative.   Cardiovascular: Negative.   Gastrointestinal: Negative.   Musculoskeletal: Negative.   Skin: Negative.   Neurological: Negative.   Psychiatric/Behavioral: Negative.     Per HPI unless specifically indicated above     Objective:    BP 136/78    Pulse 80    Temp 98.5 F (36.9 C) (Oral)    Wt (!) 344 lb 6.4 oz (156.2 kg)    LMP  (LMP Unknown)    SpO2 98%    BMI 65.07 kg/m   Wt Readings from Last 3 Encounters:  09/09/20 (!) 344 lb 6.4 oz (156.2 kg)  04/28/20 (!) 343 lb (155.6 kg)  06/25/19 (!) 336 lb (152.4 kg)    Physical Exam Vitals and nursing note reviewed.  Constitutional:      General: She is  not in acute distress.    Appearance: Normal appearance. She is not ill-appearing, toxic-appearing or diaphoretic.  HENT:     Head: Normocephalic and atraumatic.     Right Ear: External ear normal.     Left Ear: External ear normal.     Nose: Nose normal.     Mouth/Throat:     Mouth: Mucous membranes are moist.     Pharynx: Oropharynx is clear.  Eyes:     General: No scleral icterus.       Right eye: No discharge.        Left eye: No discharge.     Extraocular Movements: Extraocular movements intact.     Conjunctiva/sclera: Conjunctivae normal.     Pupils: Pupils are equal, round, and reactive to light.  Cardiovascular:     Rate and Rhythm: Normal rate and regular rhythm.     Pulses: Normal pulses.     Heart sounds: Normal heart sounds. No murmur heard.  No friction rub. No gallop.   Pulmonary:     Effort: Pulmonary effort is normal. No respiratory distress.     Breath  sounds: Normal breath sounds. No stridor. No wheezing, rhonchi or rales.  Chest:     Chest wall: No tenderness.  Musculoskeletal:        General: Tenderness (medial calf on the L) present. Normal range of motion.     Cervical back: Normal range of motion and neck supple.     Comments: + homans, no redness, no swelling, negative squeeze  Skin:    General: Skin is warm and dry.     Capillary Refill: Capillary refill takes less than 2 seconds.     Coloration: Skin is not jaundiced or pale.     Findings: No bruising, erythema, lesion or rash.  Neurological:     General: No focal deficit present.     Mental Status: She is alert and oriented to person, place, and time. Mental status is at baseline.  Psychiatric:        Mood and Affect: Mood normal.        Behavior: Behavior normal.        Thought Content: Thought content normal.        Judgment: Judgment normal.     Results for orders placed or performed in visit on 06/20/20  TSH  Result Value Ref Range   TSH 6.050 (H) 0.450 - 4.500 uIU/mL      Assessment  & Plan:   Problem List Items Addressed This Visit    None    Visit Diagnoses    Leg cramp    -  Primary   Will check labs and R/o DVT with [place]. Will start flexeril and increase hydration. Await results. Call with any concerns or if not improving,   Relevant Orders   Comprehensive metabolic panel   VITAMIN D 25 Hydroxy (Vit-D Deficiency, Fractures)   CBC with Differential/Platelet   TSH   US Venous Img Lower Unilateral Left       Follow up plan: Return if symptoms worsen or fail to improve.

## 2020-09-10 LAB — CBC WITH DIFFERENTIAL/PLATELET
Basophils Absolute: 0.1 10*3/uL (ref 0.0–0.2)
Basos: 1 %
EOS (ABSOLUTE): 0.3 10*3/uL (ref 0.0–0.4)
Eos: 3 %
Hematocrit: 43.4 % (ref 34.0–46.6)
Hemoglobin: 14.4 g/dL (ref 11.1–15.9)
Immature Grans (Abs): 0 10*3/uL (ref 0.0–0.1)
Immature Granulocytes: 0 %
Lymphocytes Absolute: 3.4 10*3/uL — ABNORMAL HIGH (ref 0.7–3.1)
Lymphs: 33 %
MCH: 28.5 pg (ref 26.6–33.0)
MCHC: 33.2 g/dL (ref 31.5–35.7)
MCV: 86 fL (ref 79–97)
Monocytes Absolute: 0.8 10*3/uL (ref 0.1–0.9)
Monocytes: 8 %
Neutrophils Absolute: 5.6 10*3/uL (ref 1.4–7.0)
Neutrophils: 55 %
Platelets: 216 10*3/uL (ref 150–450)
RBC: 5.06 x10E6/uL (ref 3.77–5.28)
RDW: 13 % (ref 11.7–15.4)
WBC: 10.2 10*3/uL (ref 3.4–10.8)

## 2020-09-10 LAB — COMPREHENSIVE METABOLIC PANEL
ALT: 10 IU/L (ref 0–32)
AST: 20 IU/L (ref 0–40)
Albumin/Globulin Ratio: 1.3 (ref 1.2–2.2)
Albumin: 4 g/dL (ref 3.8–4.8)
Alkaline Phosphatase: 104 IU/L (ref 44–121)
BUN/Creatinine Ratio: 14 (ref 9–23)
BUN: 9 mg/dL (ref 6–24)
Bilirubin Total: 0.5 mg/dL (ref 0.0–1.2)
CO2: 28 mmol/L (ref 20–29)
Calcium: 9.5 mg/dL (ref 8.7–10.2)
Chloride: 101 mmol/L (ref 96–106)
Creatinine, Ser: 0.64 mg/dL (ref 0.57–1.00)
GFR calc Af Amer: 126 mL/min/{1.73_m2} (ref >59)
GFR calc non Af Amer: 109 mL/min/{1.73_m2} (ref >59)
Globulin, Total: 3.2 g/dL (ref 1.5–4.5)
Glucose: 99 mg/dL (ref 65–99)
Potassium: 4.1 mmol/L (ref 3.5–5.2)
Sodium: 142 mmol/L (ref 134–144)
Total Protein: 7.2 g/dL (ref 6.0–8.5)

## 2020-09-10 LAB — VITAMIN D 25 HYDROXY (VIT D DEFICIENCY, FRACTURES): Vit D, 25-Hydroxy: 44.6 ng/mL (ref 30.0–100.0)

## 2020-09-10 LAB — TSH: TSH: 2.84 u[IU]/mL (ref 0.450–4.500)

## 2020-09-15 ENCOUNTER — Telehealth: Payer: Self-pay

## 2020-09-15 ENCOUNTER — Other Ambulatory Visit: Payer: Self-pay

## 2020-09-15 ENCOUNTER — Ambulatory Visit
Admission: RE | Admit: 2020-09-15 | Discharge: 2020-09-15 | Disposition: A | Discharge status: Home / Self Care | Payer: Managed Care, Other (non HMO) | Source: Ambulatory Visit | Attending: Family Medicine | Admitting: Family Medicine

## 2020-09-15 DIAGNOSIS — R252 Cramp and spasm: Secondary | ICD-10-CM | POA: Insufficient documentation

## 2020-09-15 NOTE — Telephone Encounter (Signed)
No questions, thank you.

## 2020-09-15 NOTE — Telephone Encounter (Signed)
Copied from [place] [id number]. Topic: General - Inquiry >> Sep 15, 2020 12:35 PM Gillis Ends D wrote: Reason for CRM: Butch Penny from [facility] called about the patients leg doppler this morning and stated that it was negative for blood clots. She can be reached at [phone] if you have an questions. Please advise

## 2020-09-25 ENCOUNTER — Encounter: Payer: Self-pay | Admitting: Nurse Practitioner

## 2020-09-25 DIAGNOSIS — E039 Hypothyroidism, unspecified: Secondary | ICD-10-CM | POA: Insufficient documentation

## 2020-09-29 ENCOUNTER — Ambulatory Visit (INDEPENDENT_AMBULATORY_CARE_PROVIDER_SITE_OTHER): Payer: Managed Care, Other (non HMO) | Admitting: Nurse Practitioner

## 2020-09-29 ENCOUNTER — Other Ambulatory Visit: Payer: Self-pay

## 2020-09-29 ENCOUNTER — Encounter: Payer: Self-pay | Admitting: Nurse Practitioner

## 2020-09-29 VITALS — BP 136/74 | HR 88 | Temp 97.6°F | Resp 16 | Ht 62.0 in | Wt 344.0 lb

## 2020-09-29 DIAGNOSIS — Z0000 Encounter for general adult medical examination without abnormal findings: Secondary | ICD-10-CM

## 2020-09-29 DIAGNOSIS — E7800 Pure hypercholesterolemia, unspecified: Secondary | ICD-10-CM

## 2020-09-29 DIAGNOSIS — E039 Hypothyroidism, unspecified: Secondary | ICD-10-CM | POA: Diagnosis not present

## 2020-09-29 DIAGNOSIS — R7301 Impaired fasting glucose: Secondary | ICD-10-CM | POA: Diagnosis not present

## 2020-09-29 DIAGNOSIS — E559 Vitamin D deficiency, unspecified: Secondary | ICD-10-CM

## 2020-09-29 DIAGNOSIS — Z114 Encounter for screening for human immunodeficiency virus [HIV]: Secondary | ICD-10-CM | POA: Diagnosis not present

## 2020-09-29 DIAGNOSIS — B354 Tinea corporis: Secondary | ICD-10-CM

## 2020-09-29 DIAGNOSIS — Z23 Encounter for immunization: Secondary | ICD-10-CM | POA: Diagnosis not present

## 2020-09-29 DIAGNOSIS — Z6841 Body Mass Index (BMI) 40.0 and over, adult: Secondary | ICD-10-CM

## 2020-09-29 MED ORDER — NYSTATIN 100000 UNIT/GM EX CREA: 1.0000 "application " | TOPICAL_CREAM | Freq: Two times a day (BID) | CUTANEOUS | 1 refills | Status: DC

## 2020-09-29 MED ORDER — LEVOTHYROXINE SODIUM 50 MCG PO TABS
50.0000 ug | ORAL_TABLET | Freq: Every day | ORAL | 4 refills | Status: DC
Start: 2020-09-29 — End: 2021-10-02

## 2020-09-29 NOTE — Patient Instructions (Signed)
Hypothyroidism  Hypothyroidism is when the thyroid gland does not make enough of certain hormones (it is underactive). The thyroid gland is a small gland located in the lower front part of the neck, just in front of the windpipe (trachea). This gland makes hormones that help control how the body uses food for energy (metabolism) as well as how the heart and brain function. These hormones also play a role in keeping your bones strong. When the thyroid is underactive, it produces too little of the hormones thyroxine (T4) and triiodothyronine (T3). What are the causes? This condition may be caused by:  Hashimoto's disease. This is a disease in which the body's disease-fighting system (immune system) attacks the thyroid gland. This is the most common cause.  Viral infections.  Pregnancy.  Certain medicines.  Birth defects.  Past radiation treatments to the head or neck for cancer.  Past treatment with radioactive iodine.  Past exposure to radiation in the environment.  Past surgical removal of part or all of the thyroid.  Problems with a gland in the center of the brain (pituitary gland).  Lack of enough iodine in the diet. What increases the risk? You are more likely to develop this condition if:  You are female.  You have a family history of thyroid conditions.  You use a medicine called lithium.  You take medicines that affect the immune system (immunosuppressants). What are the signs or symptoms? Symptoms of this condition include:  Feeling as though you have no energy (lethargy).  Not being able to tolerate cold.  Weight gain that is not explained by a change in diet or exercise habits.  Lack of appetite.  Dry skin.  Coarse hair.  Menstrual irregularity.  Slowing of thought processes.  Constipation.  Sadness or depression. How is this diagnosed? This condition may be diagnosed based on:  Your symptoms, your medical history, and a physical exam.  Blood  tests. You may also have imaging tests, such as an ultrasound or MRI. How is this treated? This condition is treated with medicine that replaces the thyroid hormones that your body does not make. After you begin treatment, it may take several weeks for symptoms to go away. Follow these instructions at home:  Take over-the-counter and prescription medicines only as told by your health care provider.  If you start taking any new medicines, tell your health care provider.  Keep all follow-up visits as told by your health care provider. This is important. ? As your condition improves, your dosage of thyroid hormone medicine may change. ? You will need to have blood tests regularly so that your health care provider can monitor your condition. Contact a health care provider if:  Your symptoms do not get better with treatment.  You are taking thyroid replacement medicine and you: ? Sweat a lot. ? Have tremors. ? Feel anxious. ? Lose weight rapidly. ? Cannot tolerate heat. ? Have emotional swings. ? Have diarrhea. ? Feel weak. Get help right away if you have:  Chest pain.  An irregular heartbeat.  A rapid heartbeat.  Difficulty breathing. Summary  Hypothyroidism is when the thyroid gland does not make enough of certain hormones (it is underactive).  When the thyroid is underactive, it produces too little of the hormones thyroxine (T4) and triiodothyronine (T3).  The most common cause is Hashimoto's disease, a disease in which the body's disease-fighting system (immune system) attacks the thyroid gland. The condition can also be caused by viral infections, medicine, pregnancy, or past   radiation treatment to the head or neck.  Symptoms may include weight gain, dry skin, constipation, feeling as though you do not have energy, and not being able to tolerate cold.  This condition is treated with medicine to replace the thyroid hormones that your body does not make. This information  is not intended to replace advice given to you by your health care provider. Make sure you discuss any questions you have with your health care provider. Document Revised: 11/11/2017 Document Reviewed: 11/09/2017 Elsevier Patient Education  2020 [employer]  

## 2020-09-29 NOTE — Assessment & Plan Note (Signed)
Noted on past labs, recheck A1C today and recommend continued focus on diet and exercise.  Initiated medications as needed.

## 2020-09-29 NOTE — Assessment & Plan Note (Signed)
Recommended eating smaller high protein, low fat meals more frequently and exercising 30 mins a day 5 times a week with a goal of 10-15lb weight loss in the next 3 months. Patient voiced their understanding and motivation to adhere to these recommendations.  

## 2020-09-29 NOTE — Assessment & Plan Note (Signed)
Chronic, on current medications.  Recommend continued focus on diet and regular activity.  Initiate medication as needed -- Current ASCVD 0.7%.  Labs today.

## 2020-09-29 NOTE — Assessment & Plan Note (Signed)
Chronic, ongoing.  Continue current Levothyroxine dose and adjust as needed based on labs.  TSH and Free T4 today.

## 2020-09-29 NOTE — Progress Notes (Signed)
BP 136/74   Pulse 88   Temp 97.6 F (36.4 C) (Oral)   Resp 16   Ht 5\' 2"  (1.575 m)   Wt (!) 344 lb (156 kg)   LMP  (LMP Unknown)   SpO2 98%   BMI 62.92 kg/m    Subjective:    Patient ID: Amy [name], female    DOB: 1976-10-22, 44 y.o.   MRN: [id number]  HPI: Amy [name] is a 44 y.o. female presenting on 09/29/2020 for comprehensive medical examination. Current medical complaints include:none  She currently lives with: husband Menopausal Symptoms: no   HYPOTHYROIDISM Continues on Levothyroxine 50 MCG daily. Thyroid control status:stable Satisfied with current treatment? yes Medication side effects: no Medication compliance: good compliance Etiology of hypothyroidism: unknown Recent dose adjustment:no Fatigue: no Cold intolerance: no Heat intolerance: no Weight gain: no Weight loss: no Constipation: no Diarrhea/loose stools: no Palpitations: no Lower extremity edema: no Anxiety/depressed mood: no   HYPERLIPIDEMIA Hyperlipidemia status: good compliance Supplements: none Aspirin:  no The 10-year ASCVD risk score Mikey Bussing DC Jr., et al., 2013) is: 0.7%   Values used to calculate the score:     Age: 15 years     Sex: Female     Is Non-Hispanic African American: No     Diabetic: No     Tobacco smoker: No     Systolic Blood Pressure: 638 mmHg     Is BP treated: No     HDL Cholesterol: 49 mg/dL     Total Cholesterol: 165 mg/dL Chest pain:  no Coronary artery disease:  no Family history CAD:  no Family history early CAD:  no  PREDIABETES Last A1C in May 6.2%. Polydipsia/polyuria: no Visual disturbance: no Chest pain: no Paresthesias: no   Depression Screen done today and results listed below:  Depression screen [facility] 2/9 04/28/2020  Decreased Interest 0  Down, Depressed, Hopeless 0  PHQ - 2 Score 0  Altered sleeping 1  Tired, decreased energy 0  Change in appetite 0  Feeling bad or failure about yourself  0  Trouble concentrating 0   Moving slowly or fidgety/restless 0  Suicidal thoughts 0  PHQ-9 Score 1    The patient does not have a history of falls. I did not complete a risk assessment for falls. A plan of care for falls was not documented.   Past Medical History:  Past Medical History:  Diagnosis Date  . Allergy   . Asthma   . Obesity   . Psoriasis     Surgical History:  Past Surgical History:  Procedure Laterality Date  . CESAREAN SECTION     X 2  . HAND SURGERY Right    THUMB  . HERNIA REPAIR    . right thumb surgery      Medications:  Current Outpatient Medications on File Prior to Visit  Medication Sig  . clobetasol ointment (TEMOVATE) 7.56 % Apply 1 application topically 2 (two) times daily.  . cyclobenzaprine (FLEXERIL) 10 MG tablet Take 1 tablet (10 mg total) by mouth at bedtime.  Marland Kitchen levocetirizine (XYZAL) 5 MG tablet Take 1 tablet (5 mg total) by mouth daily.  . Multiple Vitamin (MULTIVITAMINS PO) Take by mouth daily.  . Vitamin D, Ergocalciferol, (DRISDOL) 1.25 MG (50000 UNIT) CAPS capsule Take 1 capsule (50,000 Units total) by mouth once a week.   No current facility-administered medications on file prior to visit.    Allergies:  Allergies  Allergen Reactions  . Biaxin [Clarithromycin] Nausea And  Vomiting    Social History:  Social History   Socioeconomic History  . Marital status: Married    Spouse name: Not on file  . Number of children: Not on file  . Years of education: Not on file  . Highest education level: Not on file  Occupational History  . Not on file  Tobacco Use  . Smoking status: Former Research scientist (life sciences)  . Smokeless tobacco: Never Used  Vaping Use  . Vaping Use: Never used  Substance and Sexual Activity  . Alcohol use: No    Alcohol/week: 0.0 standard drinks  . Drug use: No  . Sexual activity: Not on file  Other Topics Concern  . Not on file  Social History Narrative  . Not on file   Social Determinants of Health   Financial Resource Strain:   .  Difficulty of Paying Living Expenses: Not on file  Food Insecurity:   . Worried About Charity fundraiser in the Last Year: Not on file  . Ran Out of Food in the Last Year: Not on file  Transportation Needs:   . Lack of Transportation (Medical): Not on file  . Lack of Transportation (Non-Medical): Not on file  Physical Activity:   . Days of Exercise per Week: Not on file  . Minutes of Exercise per Session: Not on file  Stress:   . Feeling of Stress : Not on file  Social Connections:   . Frequency of Communication with Friends and Family: Not on file  . Frequency of Social Gatherings with Friends and Family: Not on file  . Attends Religious Services: Not on file  . Active Member of Clubs or Organizations: Not on file  . Attends Archivist Meetings: Not on file  . Marital Status: Not on file  Intimate Partner Violence:   . Fear of Current or Ex-Partner: Not on file  . Emotionally Abused: Not on file  . Physically Abused: Not on file  . Sexually Abused: Not on file   Social History   Tobacco Use  Smoking Status Former Smoker  Smokeless Tobacco Never Used   Social History   Substance and Sexual Activity  Alcohol Use No  . Alcohol/week: 0.0 standard drinks    Family History:  Family History  Problem Relation Age of Onset  . Hypertension Mother   . Thyroid disease Mother   . Autoimmune disease Mother   . Anemia Mother   . Cirrhosis Father   . Glaucoma Father   . Asthma Daughter   . Allergies Daughter   . ADD / ADHD Son   . Thyroid disease Maternal Grandmother   . Osteoporosis Maternal Grandmother   . Hypertension Maternal Grandmother   . Congestive Heart Failure Paternal Grandmother   . Appendicitis Paternal Grandfather     Past medical history, surgical history, medications, allergies, family history and social history reviewed with patient today and changes made to appropriate areas of the chart.   Review of Systems - negative All other ROS negative  except what is listed above and in the HPI.      Objective:    BP 136/74   Pulse 88   Temp 97.6 F (36.4 C) (Oral)   Resp 16   Ht 5\' 2"  (1.575 m)   Wt (!) 344 lb (156 kg)   LMP  (LMP Unknown)   SpO2 98%   BMI 62.92 kg/m   Wt Readings from Last 3 Encounters:  09/29/20 (!) 344 lb (156 kg)  09/09/20 Marland Kitchen)  344 lb 6.4 oz (156.2 kg)  04/28/20 (!) 343 lb (155.6 kg)    Physical Exam Vitals and nursing note reviewed.  Constitutional:      General: She is awake. She is not in acute distress.    Appearance: She is well-developed and well-groomed. She is morbidly obese. She is not ill-appearing.  HENT:     Head: Normocephalic and atraumatic.     Right Ear: Hearing, tympanic membrane, ear canal and external ear normal. No drainage.     Left Ear: Hearing, tympanic membrane, ear canal and external ear normal. No drainage.     Nose: Nose normal.     Right Sinus: No maxillary sinus tenderness or frontal sinus tenderness.     Left Sinus: No maxillary sinus tenderness or frontal sinus tenderness.     Mouth/Throat:     Mouth: Mucous membranes are moist.     Pharynx: Oropharynx is clear. Uvula midline. No pharyngeal swelling, oropharyngeal exudate or posterior oropharyngeal erythema.  Eyes:     General: Lids are normal.        Right eye: No discharge.        Left eye: No discharge.     Extraocular Movements: Extraocular movements intact.     Conjunctiva/sclera: Conjunctivae normal.     Pupils: Pupils are equal, round, and reactive to light.     Visual Fields: Right eye visual fields normal and left eye visual fields normal.  Neck:     Thyroid: No thyromegaly.     Vascular: No carotid bruit.     Trachea: Trachea normal.  Cardiovascular:     Rate and Rhythm: Normal rate and regular rhythm.     Heart sounds: Normal heart sounds. No murmur heard.  No gallop.   Pulmonary:     Effort: Pulmonary effort is normal. No accessory muscle usage or respiratory distress.     Breath sounds: Normal  breath sounds.  Chest:     Breasts:        Right: Normal.        Left: Normal.  Abdominal:     General: Bowel sounds are normal.     Palpations: Abdomen is soft. There is no hepatomegaly or splenomegaly.     Tenderness: There is no abdominal tenderness.  Musculoskeletal:        General: Normal range of motion.     Cervical back: Normal range of motion and neck supple.     Right lower leg: No edema.     Left lower leg: No edema.  Lymphadenopathy:     Head:     Right side of head: No submental, submandibular, tonsillar, preauricular or posterior auricular adenopathy.     Left side of head: No submental, submandibular, tonsillar, preauricular or posterior auricular adenopathy.     Cervical: No cervical adenopathy.     Upper Body:     Right upper body: No supraclavicular, axillary or pectoral adenopathy.     Left upper body: No supraclavicular, axillary or pectoral adenopathy.  Skin:    General: Skin is warm and dry.     Capillary Refill: Capillary refill takes less than 2 seconds.     Findings: No rash.  Neurological:     Mental Status: She is alert and oriented to person, place, and time.     Cranial Nerves: Cranial nerves are intact.     Gait: Gait is intact.     Deep Tendon Reflexes: Reflexes are normal and symmetric.     Reflex Scores:  Brachioradialis reflexes are 2+ on the right side and 2+ on the left side.      Patellar reflexes are 2+ on the right side and 2+ on the left side. Psychiatric:        Attention and Perception: Attention normal.        Mood and Affect: Mood normal.        Speech: Speech normal.        Behavior: Behavior normal. Behavior is cooperative.        Thought Content: Thought content normal.        Judgment: Judgment normal.     Results for orders placed or performed in visit on 09/09/20  Comprehensive metabolic panel  Result Value Ref Range   Glucose 99 65 - 99 mg/dL   BUN 9 6 - 24 mg/dL   Creatinine, Ser 0.64 0.57 - 1.00 mg/dL   GFR  calc non Af Amer 109 >59 mL/min/1.73   GFR calc Af Amer 126 >59 mL/min/1.73   BUN/Creatinine Ratio 14 9 - 23   Sodium 142 134 - 144 mmol/L   Potassium 4.1 3.5 - 5.2 mmol/L   Chloride 101 96 - 106 mmol/L   CO2 28 20 - 29 mmol/L   Calcium 9.5 8.7 - 10.2 mg/dL   Total Protein 7.2 6.0 - 8.5 g/dL   Albumin 4.0 3.8 - 4.8 g/dL   Globulin, Total 3.2 1.5 - 4.5 g/dL   Albumin/Globulin Ratio 1.3 1.2 - 2.2   Bilirubin Total 0.5 0.0 - 1.2 mg/dL   Alkaline Phosphatase 104 44 - 121 IU/L   AST 20 0 - 40 IU/L   ALT 10 0 - 32 IU/L  VITAMIN D 25 Hydroxy (Vit-D Deficiency, Fractures)  Result Value Ref Range   Vit D, 25-Hydroxy 44.6 30.0 - 100.0 ng/mL  CBC with Differential/Platelet  Result Value Ref Range   WBC 10.2 3.4 - 10.8 x10E3/uL   RBC 5.06 3.77 - 5.28 x10E6/uL   Hemoglobin 14.4 11.1 - 15.9 g/dL   Hematocrit 43.4 34.0 - 46.6 %   MCV 86 79 - 97 fL   MCH 28.5 26.6 - 33.0 pg   MCHC 33.2 31 - 35 g/dL   RDW 13.0 11.7 - 15.4 %   Platelets 216 150 - 450 x10E3/uL   Neutrophils 55 Not Estab. %   Lymphs 33 Not Estab. %   Monocytes 8 Not Estab. %   Eos 3 Not Estab. %   Basos 1 Not Estab. %   Neutrophils Absolute 5.6 1 - 7 x10E3/uL   Lymphocytes Absolute 3.4 (H) 0 - 3 x10E3/uL   Monocytes Absolute 0.8 0 - 0 x10E3/uL   EOS (ABSOLUTE) 0.3 0.0 - 0.4 x10E3/uL   Basophils Absolute 0.1 0 - 0 x10E3/uL   Immature Granulocytes 0 Not Estab. %   Immature Grans (Abs) 0.0 0.0 - 0.1 x10E3/uL  TSH  Result Value Ref Range   TSH 2.840 0.450 - 4.500 uIU/mL      Assessment & Plan:   Problem List Items Addressed This Visit      Endocrine   IFG (impaired fasting glucose)    Noted on past labs, recheck A1C today and recommend continued focus on diet and exercise.  Initiated medications as needed.      Relevant Orders   Comprehensive metabolic panel   HgB V7C   Hypothyroid    Chronic, ongoing.  Continue current Levothyroxine dose and adjust as needed based on labs.  TSH and Free T4 today.  Relevant  Medications   levothyroxine (SYNTHROID) 50 MCG tablet   Other Relevant Orders   TSH   T4, free     Other   Hyperlipidemia    Chronic, on current medications.  Recommend continued focus on diet and regular activity.  Initiate medication as needed -- Current ASCVD 0.7%.  Labs today.      Relevant Orders   CBC with Differential/Platelet   Comprehensive metabolic panel   Lipid Panel w/o Chol/HDL Ratio   Obesity    Recommended eating smaller high protein, low fat meals more frequently and exercising 30 mins a day 5 times a week with a goal of 10-15lb weight loss in the next 3 months. Patient voiced their understanding and motivation to adhere to these recommendations.       Vitamin D deficiency    Ongoing, continue weekly supplement and recheck level today.  Could consider changing to daily Vitamin D3 if level improving.      Relevant Orders   VITAMIN D 25 Hydroxy (Vit-D Deficiency, Fractures)    Other Visit Diagnoses    Routine general medical examination at a health care facility    -  Primary   Annual labs today to include CBC, CMP, TSH, Lipid.  Annual physical form to fill out and fax.   Tinea corporis       Noted under posterior right side bra line.  Script for Nystatin sent and to return if any worsening or no improvement.   Relevant Medications   nystatin cream (MYCOSTATIN)   Need for influenza vaccination       Flu vaccine today   Relevant Orders   Flu Vaccine QUAD 36+ mos IM (Completed)   Encounter for screening for HIV       HIV screening on labs today   Relevant Orders   HIV Antibody (routine testing w rflx)       Follow up plan: Return in about 6 months (around 03/30/2021) for Hypothyroid and to meet new PCP.   LABORATORY TESTING:  - Pap smear: refused  IMMUNIZATIONS:   - Tdap: Tetanus vaccination status reviewed: last tetanus booster within 10 years. - Influenza: Up to date - Pneumovax: Not applicable - Prevnar: Not applicable - HPV: Not applicable -  Zostavax vaccine: Not applicable  SCREENING: -Mammogram: Refused  - Colonoscopy: Refused  - Bone Density: Not applicable  -Hearing Test: Not applicable  -Spirometry: Not applicable   PATIENT COUNSELING:   Advised to take 1 mg of folate supplement per day if capable of pregnancy.   Sexuality: Discussed sexually transmitted diseases, partner selection, use of condoms, avoidance of unintended pregnancy  and contraceptive alternatives.   Advised to avoid cigarette smoking.  I discussed with the patient that most people either abstain from alcohol or drink within safe limits (<=14/week and <=4 drinks/occasion for males, <=7/weeks and <= 3 drinks/occasion for females) and that the risk for alcohol disorders and other health effects rises proportionally with the number of drinks per week and how often a drinker exceeds daily limits.  Discussed cessation/primary prevention of drug use and availability of treatment for abuse.   Diet: Encouraged to adjust caloric intake to maintain  or achieve ideal body weight, to reduce intake of dietary saturated fat and total fat, to limit sodium intake by avoiding high sodium foods and not adding table salt, and to maintain adequate dietary potassium and calcium preferably from fresh fruits, vegetables, and low-fat dairy products.    stressed the importance of regular exercise  Injury prevention: Discussed safety belts, safety helmets, smoke detector, smoking near bedding or upholstery.   Dental health: Discussed importance of regular tooth brushing, flossing, and dental visits.    NEXT PREVENTATIVE PHYSICAL DUE IN 1 YEAR. Return in about 6 months (around 03/30/2021) for Hypothyroid and to meet new PCP.

## 2020-09-29 NOTE — Assessment & Plan Note (Signed)
Ongoing, continue weekly supplement and recheck level today.  Could consider changing to daily Vitamin D3 if level improving. 

## 2020-09-30 LAB — COMPREHENSIVE METABOLIC PANEL
ALT: 11 IU/L (ref 0–32)
AST: 26 IU/L (ref 0–40)
Albumin/Globulin Ratio: 1.2 (ref 1.2–2.2)
Albumin: 3.8 g/dL (ref 3.8–4.8)
Alkaline Phosphatase: 106 IU/L (ref 44–121)
BUN/Creatinine Ratio: 13 (ref 9–23)
BUN: 8 mg/dL (ref 6–24)
Bilirubin Total: 0.8 mg/dL (ref 0.0–1.2)
CO2: 29 mmol/L (ref 20–29)
Calcium: 9.1 mg/dL (ref 8.7–10.2)
Chloride: 100 mmol/L (ref 96–106)
Creatinine, Ser: 0.6 mg/dL (ref 0.57–1.00)
GFR calc Af Amer: 128 mL/min/{1.73_m2} (ref >59)
GFR calc non Af Amer: 111 mL/min/{1.73_m2} (ref >59)
Globulin, Total: 3.3 g/dL (ref 1.5–4.5)
Glucose: 112 mg/dL — ABNORMAL HIGH (ref 65–99)
Potassium: 3.9 mmol/L (ref 3.5–5.2)
Sodium: 139 mmol/L (ref 134–144)
Total Protein: 7.1 g/dL (ref 6.0–8.5)

## 2020-09-30 LAB — CBC WITH DIFFERENTIAL/PLATELET
Basophils Absolute: 0.1 10*3/uL (ref 0.0–0.2)
Basos: 1 %
EOS (ABSOLUTE): 0.3 10*3/uL (ref 0.0–0.4)
Eos: 3 %
Hematocrit: 44.7 % (ref 34.0–46.6)
Hemoglobin: 14.9 g/dL (ref 11.1–15.9)
Immature Grans (Abs): 0.1 10*3/uL (ref 0.0–0.1)
Immature Granulocytes: 1 %
Lymphocytes Absolute: 2.7 10*3/uL (ref 0.7–3.1)
Lymphs: 29 %
MCH: 28.9 pg (ref 26.6–33.0)
MCHC: 33.3 g/dL (ref 31.5–35.7)
MCV: 87 fL (ref 79–97)
Monocytes Absolute: 0.6 10*3/uL (ref 0.1–0.9)
Monocytes: 6 %
Neutrophils Absolute: 5.5 10*3/uL (ref 1.4–7.0)
Neutrophils: 60 %
Platelets: 221 10*3/uL (ref 150–450)
RBC: 5.16 x10E6/uL (ref 3.77–5.28)
RDW: 12.9 % (ref 11.7–15.4)
WBC: 9.2 10*3/uL (ref 3.4–10.8)

## 2020-09-30 LAB — LIPID PANEL W/O CHOL/HDL RATIO
Cholesterol, Total: 178 mg/dL (ref 100–199)
HDL: 43 mg/dL (ref >39)
LDL Chol Calc (NIH): 122 mg/dL — ABNORMAL HIGH (ref 0–99)
Triglycerides: 71 mg/dL (ref 0–149)
VLDL Cholesterol Cal: 13 mg/dL (ref 5–40)

## 2020-09-30 LAB — HEMOGLOBIN A1C
Est. average glucose Bld gHb Est-mCnc: 146 mg/dL
Hgb A1c MFr Bld: 6.7 % — ABNORMAL HIGH (ref 4.8–5.6)

## 2020-09-30 LAB — VITAMIN D 25 HYDROXY (VIT D DEFICIENCY, FRACTURES): Vit D, 25-Hydroxy: 40.4 ng/mL (ref 30.0–100.0)

## 2020-09-30 LAB — TSH: TSH: 2.7 u[IU]/mL (ref 0.450–4.500)

## 2020-09-30 LAB — T4, FREE: Free T4: 1.37 ng/dL (ref 0.82–1.77)

## 2020-09-30 LAB — HIV ANTIBODY (ROUTINE TESTING W REFLEX): HIV Screen 4th Generation wRfx: NONREACTIVE

## 2020-09-30 NOTE — Progress Notes (Signed)
Contacted via MyChart -- also left general message via telephone  Good afternoon Ms. Wiland, your labs have returned.   - A1C has increased to diabetic range at 6.7% now.  Type 2 diabetes is considered any A1C 6.5% or greater.  There are two directions we can take here.  You can try diet changes and regular activity over next 3 months and see if can bring this down on own + we could supply you with glucometer to monitor fasting morning blood sugar daily with goal of < 130 level AND we could also place referral to diabetic education if interested.  OR we could start Metformin once to twice a day, this medication is recommended for diabetes initial treatment.  Let me know what direction you would like to take.  I would like to see you back in 3 months to recheck this. - Thyroid is normal, continue current Levothyroxine dose - Cholesterol shows mild elevation in LDL, bad cholesterol, continue focus on diet and regular activity. Any questions? Keep being awesome!!  Thank you for allowing me to participate in your care. Kindest regards, Kelli Robeck

## 2020-10-01 NOTE — Progress Notes (Signed)
Called pt let VM for her to call back.  KP

## 2020-10-03 ENCOUNTER — Other Ambulatory Visit: Payer: Self-pay | Admitting: Nurse Practitioner

## 2020-10-03 DIAGNOSIS — E669 Obesity, unspecified: Secondary | ICD-10-CM

## 2020-10-03 DIAGNOSIS — E1169 Type 2 diabetes mellitus with other specified complication: Secondary | ICD-10-CM

## 2020-10-03 MED ORDER — ONETOUCH VERIO W/DEVICE KIT: PACK | 0 refills | Status: DC

## 2020-10-03 MED ORDER — METFORMIN HCL 500 MG PO TABS: ORAL_TABLET | ORAL | 3 refills | Status: DC

## 2020-10-03 MED ORDER — ONETOUCH VERIO VI STRP: ORAL_STRIP | 12 refills | Status: DC

## 2020-10-03 MED ORDER — ONETOUCH ULTRASOFT LANCETS MISC: 12 refills | Status: DC

## 2020-10-06 NOTE — Progress Notes (Signed)
Please Advise.  Pt stated she would like to start Metformin. [place] She would also like to see a nutritionists.  KP

## 2020-10-06 NOTE — Progress Notes (Signed)
Called pt left VM that Metformin was in. Also that a referral has been placed to give [place] a call if she has not heard from anyone to schedule her an appointment.  KP

## 2020-10-29 ENCOUNTER — Encounter: Payer: Managed Care, Other (non HMO) | Admitting: Nurse Practitioner

## 2020-10-29 ENCOUNTER — Encounter: Payer: Managed Care, Other (non HMO) | Admitting: Family Medicine

## 2020-11-04 ENCOUNTER — Encounter: Payer: Managed Care, Other (non HMO) | Attending: Nurse Practitioner | Admitting: *Deleted

## 2020-11-04 ENCOUNTER — Encounter: Payer: Self-pay | Admitting: *Deleted

## 2020-11-04 ENCOUNTER — Other Ambulatory Visit: Payer: Self-pay

## 2020-11-04 LAB — HM DIABETES EYE EXAM

## 2020-11-04 NOTE — Patient Instructions (Addendum)
Check blood sugars 1 x day before breakfast or 2 hrs after one meal every day Bring blood sugar records to the next appointment  Exercise: Begin walking for  10-15  minutes  3  days a week and gradually increase to 30 minutes 5 x week  Eat 3 meals day, 1-2 snacks a day Space meals 4-6 hours apart Don't skip meals Avoid sugar sweetened drinks (soda)  Complete 3 Day Food Record and bring to next appt  Return for appointment on:  Tuesday November 25, 2020 at 9:00 am with Freda Munro (nurse)

## 2020-11-04 NOTE — Progress Notes (Signed)
Diabetes Self-Management Education  Visit Type: First/Initial  Appt. Start Time: 0910 Appt. End Time: 1020  11/04/2020  [name], identified by name and date of birth, is a 44 y.o. female with a diagnosis of Diabetes: Type 2.   ASSESSMENT  Blood pressure 140/80, height 5\' 2"  (1.575 m), weight (!) 347 lb 8 oz (157.6 kg). Body mass index is 63.56 kg/m.   Diabetes Self-Management Education - 11/04/20 1055      Visit Information   Visit Type First/Initial      Initial Visit   Diabetes Type Type 2    Are you currently following a meal plan? No    Are you taking your medications as prescribed? Yes    Date Diagnosed 2 months ago      Health Coping   How would you rate your overall health? Good      Psychosocial Assessment   Patient Belief/Attitude about Diabetes Other (comment)   "I don't like it"   Self-care barriers None    Self-management support Doctor's office;Family;Friends    Patient Concerns Nutrition/Meal planning;Medication;Weight Control;Healthy Lifestyle    Special Needs None    Preferred Learning Style Hands on    Learning Readiness Ready    How often do you need to have someone help you when you read instructions, pamphlets, or other written materials from your doctor or pharmacy? 1 - Never    What is the last grade level you completed in school? 14 +      Pre-Education Assessment   Patient understands the diabetes disease and treatment process. Needs Instruction    Patient understands incorporating nutritional management into lifestyle. Needs Instruction    Patient undertands incorporating physical activity into lifestyle. Needs Instruction    Patient understands using medications safely. Needs Instruction    Patient understands monitoring blood glucose, interpreting and using results Needs Review    Patient understands prevention, detection, and treatment of acute complications. Needs Instruction    Patient understands prevention, detection, and  treatment of chronic complications. Needs Instruction    Patient understands how to develop strategies to address psychosocial issues. Needs Instruction    Patient understands how to develop strategies to promote health/change behavior. Needs Instruction      Complications   Last HgB A1C per patient/outside source 6.7 %   09/29/2020   How often do you check your blood sugar? 1-2 times/day    Fasting Blood glucose range (mg/dL) 70-129   Pt reports FBG's 128 mg/dL and less.   Have you had a dilated eye exam in the past 12 months? Yes    Have you had a dental exam in the past 12 months? Yes    Are you checking your feet? No      Dietary Intake   Breakfast ham biscuit, bacon, eggs and occasional toast    Lunch "skips" - soup, [place] sandwich, fruit (banana, pineapple, strawberries, grapes)    Snack (afternoon) 0-1 snacks/day - [place] icee    Dinner beef, pork, fish, chicken, [place]; occasional potatoes, peas, beans, corn, rice, pasta, cooked carrots, green beans, lettuce, broccoli, greens, brussels sprouts    Beverage(s) water, Sprite, Gingerale      Exercise   Exercise Type ADL's      Patient Education   Previous Diabetes Education No    Disease state  Definition of diabetes, type 1 and 2, and the diagnosis of diabetes;Factors that contribute to the development of diabetes    Nutrition management  Role of diet in  the treatment of diabetes and the relationship between the three main macronutrients and blood glucose level;Food label reading, portion sizes and measuring food.;Reviewed blood glucose goals for pre and post meals and how to evaluate the patients' food intake on their blood glucose level.    Physical activity and exercise  Role of exercise on diabetes management, blood pressure control and cardiac health.    Medications Reviewed patients medication for diabetes, action, purpose, timing of dose and side effects.    Monitoring Purpose and frequency of SMBG.;Taught/discussed  recording of test results and interpretation of SMBG.;Identified appropriate SMBG and/or A1C goals.    Chronic complications Relationship between chronic complications and blood glucose control    Psychosocial adjustment Identified and addressed patients feelings and concerns about diabetes      Individualized Goals (developed by patient)   Reducing Risk Other (comment)   decrease medications, lose weight, lead a healthier lifestyle     Outcomes   Expected Outcomes Demonstrated interest in learning. Expect positive outcomes    Future DMSE 2 wks           Individualized Plan for Diabetes Self-Management Training:   Learning Objective:  Patient will have a greater understanding of diabetes self-management. Patient education plan is to attend individual and/or group sessions per assessed needs and concerns.   Plan:   Patient Instructions  Check blood sugars 1 x day before breakfast or 2 hrs after one meal every day Bring blood sugar records to the next appointment  Exercise: Begin walking for  10-15  minutes  3  days a week and gradually increase to 30 minutes 5 x week  Eat 3 meals day, 1-2 snacks a day Space meals 4-6 hours apart Don't skip meals Avoid sugar sweetened drinks (soda)  Complete 3 Day Food Record and bring to next appt  Return for appointment on:  Tuesday November 25, 2020 at 9:00 am with Freda Munro (nurse)  Expected Outcomes:  Demonstrated interest in learning. Expect positive outcomes  Education material provided: General Meal Planning Guidelines Simple Meal Plan 3 Day Food Record  If problems or questions, patient to contact team via:  Johny Drilling, RN, [place] [phone]  Future DSME appointment: 2 wks  November 25, 2020 with the nurse

## 2020-11-25 ENCOUNTER — Other Ambulatory Visit: Payer: Self-pay

## 2020-11-25 ENCOUNTER — Encounter: Payer: Self-pay | Admitting: *Deleted

## 2020-11-25 ENCOUNTER — Encounter: Payer: Managed Care, Other (non HMO) | Attending: Nurse Practitioner | Admitting: *Deleted

## 2020-11-25 VITALS — BP 138/78 | Wt 343.2 lb

## 2020-11-25 DIAGNOSIS — Z7984 Long term (current) use of oral hypoglycemic drugs: Secondary | ICD-10-CM | POA: Insufficient documentation

## 2020-11-25 DIAGNOSIS — E119 Type 2 diabetes mellitus without complications: Secondary | ICD-10-CM | POA: Diagnosis present

## 2020-11-25 NOTE — Patient Instructions (Signed)
Check blood sugars 1 x day before breakfast or 2 hrs after one meal every day  Exercise: Begin walking for 10-15  minutes  3 days a week and gradually increase to 30 minutes 5 x week   Try to get up and move every hour  Eat 3 meals day,  1-2  snacks a day Space meals 4-6 hours apart Avoid sugar sweetened drinks (soda)

## 2020-11-25 NOTE — Progress Notes (Signed)
Diabetes Self-Management Education  Visit Type: Follow-up  Appt. Start Time: 0850 Appt. End Time: 0945  11/25/2020  [name], identified by name and date of birth, is a 44 y.o. female with a diagnosis of Diabetes:  .   ASSESSMENT  Blood pressure 138/78, weight (!) 343 lb 3.2 oz (155.7 kg). Body mass index is 62.77 kg/m.   Diabetes Self-Management Education - 11/25/20 1014      Visit Information   Visit Type Follow-up      Complications   How often do you check your blood sugar? 1-2 times/day   Didn't bring records to appt   Fasting Blood glucose range (mg/dL) 130-179   Pt reports FBG's usually 130's mg/dL.   Postprandial Blood glucose range (mg/dL) 70-129;130-179   Pt reports pp's 118, 98 and 1 reading of 167 mg/dL after eating spaghetti.   Have you had a dilated eye exam in the past 12 months? Yes    Have you had a dental exam in the past 12 months? Yes    Are you checking your feet? No      Dietary Intake   Breakfast eats 3 meals and 0-1 snacks/day    Beverage(s) water and sugar sweetened soda      Exercise   Exercise Type ADL's      Patient Education   Disease state  Factors that contribute to the development of diabetes    Nutrition management  Role of diet in the treatment of diabetes and the relationship between the three main macronutrients and blood glucose level;Food label reading, portion sizes and measuring food.;Reviewed blood glucose goals for pre and post meals and how to evaluate the patients' food intake on their blood glucose level.;Meal timing in regards to the patients' current diabetes medication.    Physical activity and exercise  Role of exercise on diabetes management, blood pressure control and cardiac health.    Medications Reviewed patients medication for diabetes, action, purpose, timing of dose and side effects.    Monitoring Purpose and frequency of SMBG.;Taught/discussed recording of test results and interpretation of SMBG.;Identified  appropriate SMBG and/or A1C goals.;Daily foot exams    Chronic complications Relationship between chronic complications and blood glucose control    Psychosocial adjustment Identified and addressed patients feelings and concerns about diabetes      Individualized Goals (developed by patient)   Nutrition Follow meal plan discussed    Physical Activity Exercise 3-5 times per week    Monitoring  test my blood glucose as discussed      Post-Education Assessment   Patient understands the diabetes disease and treatment process. Demonstrates understanding / competency    Patient understands incorporating nutritional management into lifestyle. Needs Review    Patient undertands incorporating physical activity into lifestyle. Needs Review    Patient understands using medications safely. Demonstrates understanding / competency    Patient understands monitoring blood glucose, interpreting and using results Demonstrates understanding / competency    Patient understands prevention, detection, and treatment of acute complications. Demonstrates understanding / competency    Patient understands prevention, detection, and treatment of chronic complications. Demonstrates understanding / competency    Patient understands how to develop strategies to address psychosocial issues. Demonstrates understanding / competency    Patient understands how to develop strategies to promote health/change behavior. Demonstrates understanding / competency      Outcomes   Expected Outcomes Demonstrated interest in learning. Expect positive outcomes    Future DMSE PRN    Program Status Completed  Subsequent Visit   Since your last visit have you continued or begun to take your medications as prescribed? Yes    Since your last visit have you had your blood pressure checked? No    Since your last visit have you experienced any weight changes? Loss    Weight Loss (lbs) 4    Since your last visit, are you checking your  blood glucose at least once a day? Yes           Individualized Plan for Diabetes Self-Management Training:   Learning Objective:  Patient will have a greater understanding of diabetes self-management. Patient education plan is to attend individual and/or group sessions per assessed needs and concerns.   Plan:   Patient Instructions  Check blood sugars 1 x day before breakfast or 2 hrs after one meal every day  Exercise: Begin walking for 10-15  minutes  3 days a week and gradually increase to 30 minutes 5 x week   Try to get up and move every hour  Eat 3 meals day,  1-2  snacks a day Space meals 4-6 hours apart Avoid sugar sweetened drinks (soda)  Expected Outcomes:  Demonstrated interest in learning. Expect positive outcomes  Education material provided: General Meal Planning  3 Day Food Record  If problems or questions, patient to contact team via:  Johny Drilling, RN, [place], [place] [phone]  Future DSME appointment: PRN

## 2020-12-20 ENCOUNTER — Encounter: Payer: Self-pay | Admitting: Nurse Practitioner

## 2020-12-23 ENCOUNTER — Ambulatory Visit (INDEPENDENT_AMBULATORY_CARE_PROVIDER_SITE_OTHER): Payer: Managed Care, Other (non HMO) | Admitting: Nurse Practitioner

## 2020-12-23 ENCOUNTER — Other Ambulatory Visit: Payer: Self-pay

## 2020-12-23 ENCOUNTER — Encounter: Payer: Self-pay | Admitting: Nurse Practitioner

## 2020-12-23 DIAGNOSIS — E1169 Type 2 diabetes mellitus with other specified complication: Secondary | ICD-10-CM

## 2020-12-23 DIAGNOSIS — Z23 Encounter for immunization: Secondary | ICD-10-CM

## 2020-12-23 DIAGNOSIS — E785 Hyperlipidemia, unspecified: Secondary | ICD-10-CM

## 2020-12-23 LAB — BAYER DCA HB A1C WAIVED: HB A1C (BAYER DCA - WAIVED): 6.3 % (ref <7.0)

## 2020-12-23 MED ORDER — METFORMIN HCL 500 MG PO TABS: ORAL_TABLET | ORAL | 3 refills | Status: DC

## 2020-12-23 NOTE — Progress Notes (Signed)
BP 138/82   Pulse 83   Temp 97.9 F (36.6 C) (Oral)   Ht 4' 11.88" (1.521 m)   Wt (!) 347 lb (157.4 kg)   SpO2 99%   BMI 68.04 kg/m    Subjective:    Patient ID: Amy [name], female    DOB: 1976/06/30, 45 y.o.   MRN: [id number]  HPI: Amy [name] is a 45 y.o. female  Chief Complaint  Patient presents with  . Diabetes   DIABETES Initial diagnosis October 2021 with A1c 6.7%.  She is taking 500 MG at night and focused on diet changes.  She did attend x 2 refresher courses with diabetic education nurse, last visit 11/25/20. Hypoglycemic episodes:no Polydipsia/polyuria: no Visual disturbance: no Chest pain: no Paresthesias: no Glucose Monitoring: yes  Accucheck frequency: Daily  Fasting glucose: 112 to 136  Post prandial:  Evening:  Before meals: Taking Insulin?: no  Long acting insulin:  Short acting insulin: Blood Pressure Monitoring: not checking Retinal Examination: Up to Date Foot Exam: Up to Date Diabetic Education: Completed Pneumovax: will perform today Influenza: Up to Date Aspirin: yes   HYPERLIPIDEMIA No current medications, is diet focused with LDL in October 2021 -- 122. Supplements: none Aspirin:  no The 10-year ASCVD risk score Mikey Bussing DC Jr., et al., 2013) is: 2.1%   Values used to calculate the score:     Age: 76 years     Sex: Female     Is Non-Hispanic African American: No     Diabetic: Yes     Tobacco smoker: No     Systolic Blood Pressure: 425 mmHg     Is BP treated: No     HDL Cholesterol: 43 mg/dL     Total Cholesterol: 178 mg/dL Chest pain:  no Coronary artery disease:  no Family history CAD:  no Family history early CAD:  no  Relevant past medical, surgical, family and social history reviewed and updated as indicated. Interim medical history since our last visit reviewed. Allergies and medications reviewed and updated.  Review of Systems  Constitutional: Negative.   Respiratory: Negative.   Cardiovascular:  Negative.   Gastrointestinal: Negative.   Neurological: Negative.   Psychiatric/Behavioral: Negative.     Per HPI unless specifically indicated above     Objective:    BP 138/82   Pulse 83   Temp 97.9 F (36.6 C) (Oral)   Ht 4' 11.88" (1.521 m)   Wt (!) 347 lb (157.4 kg)   SpO2 99%   BMI 68.04 kg/m   Wt Readings from Last 3 Encounters:  12/23/20 (!) 347 lb (157.4 kg)  11/25/20 (!) 343 lb 3.2 oz (155.7 kg)  11/04/20 (!) 347 lb 8 oz (157.6 kg)    Physical Exam Vitals and nursing note reviewed.  Constitutional:      General: She is awake. She is not in acute distress.    Appearance: She is well-developed and well-groomed. She is morbidly obese. She is not ill-appearing.  HENT:     Head: Normocephalic.     Right Ear: Hearing normal.     Left Ear: Hearing normal.  Eyes:     General: Lids are normal.        Right eye: No discharge.        Left eye: No discharge.     Conjunctiva/sclera: Conjunctivae normal.     Pupils: Pupils are equal, round, and reactive to light.  Neck:     Thyroid: No thyromegaly.  Vascular: No carotid bruit.  Cardiovascular:     Rate and Rhythm: Normal rate and regular rhythm.     Heart sounds: Normal heart sounds. No murmur heard. No gallop.   Pulmonary:     Effort: Pulmonary effort is normal. No accessory muscle usage or respiratory distress.     Breath sounds: Normal breath sounds.  Abdominal:     General: Bowel sounds are normal.     Palpations: Abdomen is soft.  Musculoskeletal:     Cervical back: Normal range of motion and neck supple.     Right lower leg: No edema.     Left lower leg: No edema.  Skin:    General: Skin is warm and dry.  Neurological:     Mental Status: She is alert and oriented to person, place, and time.  Psychiatric:        Attention and Perception: Attention normal.        Mood and Affect: Mood normal.        Speech: Speech normal.        Behavior: Behavior normal. Behavior is cooperative.        Thought  Content: Thought content normal.    Diabetic Foot Exam - Simple   Simple Foot Form Visual Inspection No deformities, no ulcerations, no other skin breakdown bilaterally: Yes Sensation Testing Intact to touch and monofilament testing bilaterally: Yes Pulse Check Posterior Tibialis and Dorsalis pulse intact bilaterally: Yes Comments     Results for orders placed or performed in visit on 09/29/20  HIV Antibody (routine testing w rflx)  Result Value Ref Range   HIV Screen 4th Generation wRfx Non Reactive Non Reactive  CBC with Differential/Platelet  Result Value Ref Range   WBC 9.2 3.4 - 10.8 x10E3/uL   RBC 5.16 3.77 - 5.28 x10E6/uL   Hemoglobin 14.9 11.1 - 15.9 g/dL   Hematocrit 44.7 34.0 - 46.6 %   MCV 87 79 - 97 fL   MCH 28.9 26.6 - 33.0 pg   MCHC 33.3 31.5 - 35.7 g/dL   RDW 12.9 11.7 - 15.4 %   Platelets 221 150 - 450 x10E3/uL   Neutrophils 60 Not Estab. %   Lymphs 29 Not Estab. %   Monocytes 6 Not Estab. %   Eos 3 Not Estab. %   Basos 1 Not Estab. %   Neutrophils Absolute 5.5 1.4 - 7.0 x10E3/uL   Lymphocytes Absolute 2.7 0.7 - 3.1 x10E3/uL   Monocytes Absolute 0.6 0.1 - 0.9 x10E3/uL   EOS (ABSOLUTE) 0.3 0.0 - 0.4 x10E3/uL   Basophils Absolute 0.1 0.0 - 0.2 x10E3/uL   Immature Granulocytes 1 Not Estab. %   Immature Grans (Abs) 0.1 0.0 - 0.1 x10E3/uL  Comprehensive metabolic panel  Result Value Ref Range   Glucose 112 (H) 65 - 99 mg/dL   BUN 8 6 - 24 mg/dL   Creatinine, Ser 0.60 0.57 - 1.00 mg/dL   GFR calc non Af Amer 111 >59 mL/min/1.73   GFR calc Af Amer 128 >59 mL/min/1.73   BUN/Creatinine Ratio 13 9 - 23   Sodium 139 134 - 144 mmol/L   Potassium 3.9 3.5 - 5.2 mmol/L   Chloride 100 96 - 106 mmol/L   CO2 29 20 - 29 mmol/L   Calcium 9.1 8.7 - 10.2 mg/dL   Total Protein 7.1 6.0 - 8.5 g/dL   Albumin 3.8 3.8 - 4.8 g/dL   Globulin, Total 3.3 1.5 - 4.5 g/dL   Albumin/Globulin Ratio 1.2 1.2 -  2.2   Bilirubin Total 0.8 0.0 - 1.2 mg/dL   Alkaline Phosphatase 106  44 - 121 IU/L   AST 26 0 - 40 IU/L   ALT 11 0 - 32 IU/L  Lipid Panel w/o Chol/HDL Ratio  Result Value Ref Range   Cholesterol, Total 178 100 - 199 mg/dL   Triglycerides 71 0 - 149 mg/dL   HDL 43 >39 mg/dL   VLDL Cholesterol Cal 13 5 - 40 mg/dL   LDL Chol Calc (NIH) 122 (H) 0 - 99 mg/dL  TSH  Result Value Ref Range   TSH 2.700 0.450 - 4.500 uIU/mL  T4, free  Result Value Ref Range   Free T4 1.37 0.82 - 1.77 ng/dL  HgB A1c  Result Value Ref Range   Hgb A1c MFr Bld 6.7 (H) 4.8 - 5.6 %   Est. average glucose Bld gHb Est-mCnc 146 mg/dL  VITAMIN D 25 Hydroxy (Vit-D Deficiency, Fractures)  Result Value Ref Range   Vit D, 25-Hydroxy 40.4 30.0 - 100.0 ng/mL      Assessment & Plan:   Problem List Items Addressed This Visit      Endocrine   Type 2 diabetes mellitus with morbid obesity ([place]) - Primary    New diagnosis in October 2021 -- A1c 6.7% at that time.  Today A1c 6.3%, downward trend with Metformin once daily.  Continue current medication regimen and adjust as needed + continue diet focus.  Continue to monitor BS daily.  Obtain urine ALB next visit, could not void today.  PPSV23 provided in office today and educated patient on this.  Return in 6 months to meet new PCP.      Relevant Medications   metFORMIN (GLUCOPHAGE) 500 MG tablet   Other Relevant Orders   Bayer DCA Hb A1c Waived   Basic metabolic panel   Hyperlipidemia associated with type 2 diabetes mellitus (HCC)    Chronic, no current medications.  Recommend continued focus on diet and regular activity.  Initiate medication as needed -- discussed with patient importance of statin use in diabetics, but she wishes to hold off at this time -- Current ASCVD 2.1%.  Labs today.      Relevant Medications   metFORMIN (GLUCOPHAGE) 500 MG tablet   Other Relevant Orders   Bayer DCA Hb A1c Waived   Lipid Panel w/o Chol/HDL Ratio     Other   Morbid obesity (HCC)    Recommended eating smaller high protein, low fat meals more  frequently and exercising 30 mins a day 5 times a week with a goal of 10-15lb weight loss in the next 3 months. Patient voiced their understanding and motivation to adhere to these recommendations.       Relevant Medications   metFORMIN (GLUCOPHAGE) 500 MG tablet       Follow up plan: Return in about 6 months (around 06/22/2021) for T2DM, HTN -- meet new PCP and need urine micro ALB.

## 2020-12-23 NOTE — Assessment & Plan Note (Signed)
Chronic, no current medications.  Recommend continued focus on diet and regular activity.  Initiate medication as needed -- discussed with patient importance of statin use in diabetics, but she wishes to hold off at this time -- Current ASCVD 2.1%.  Labs today.

## 2020-12-23 NOTE — Assessment & Plan Note (Signed)
Recommended eating smaller high protein, low fat meals more frequently and exercising 30 mins a day 5 times a week with a goal of 10-15lb weight loss in the next 3 months. Patient voiced their understanding and motivation to adhere to these recommendations.  

## 2020-12-23 NOTE — Assessment & Plan Note (Signed)
New diagnosis in October 2021 -- A1c 6.7% at that time.  Today A1c 6.3%, downward trend with Metformin once daily.  Continue current medication regimen and adjust as needed + continue diet focus.  Continue to monitor BS daily.  Obtain urine ALB next visit, could not void today.  PPSV23 provided in office today and educated patient on this.  Return in 6 months to meet new PCP.

## 2020-12-23 NOTE — Patient Instructions (Signed)
Diabetes Mellitus and Nutrition, Adult When you have diabetes, or diabetes mellitus, it is very important to have healthy eating habits because your blood sugar (glucose) levels are greatly affected by what you eat and drink. Eating healthy foods in the right amounts, at about the same times every day, can help you:  Control your blood glucose.  Lower your risk of heart disease.  Improve your blood pressure.  Reach or maintain a healthy weight. What can affect my meal plan? Every person with diabetes is different, and each person has different needs for a meal plan. Your health care provider may recommend that you work with a dietitian to make a meal plan that is best for you. Your meal plan may vary depending on factors such as:  The calories you need.  The medicines you take.  Your weight.  Your blood glucose, blood pressure, and cholesterol levels.  Your activity level.  Other health conditions you have, such as heart or kidney disease. How do carbohydrates affect me? Carbohydrates, also called carbs, affect your blood glucose level more than any other type of food. Eating carbs naturally raises the amount of glucose in your blood. Carb counting is a method for keeping track of how many carbs you eat. Counting carbs is important to keep your blood glucose at a healthy level, especially if you use insulin or take certain oral diabetes medicines. It is important to know how many carbs you can safely have in each meal. This is different for every person. Your dietitian can help you calculate how many carbs you should have at each meal and for each snack. How does alcohol affect me? Alcohol can cause a sudden decrease in blood glucose (hypoglycemia), especially if you use insulin or take certain oral diabetes medicines. Hypoglycemia can be a life-threatening condition. Symptoms of hypoglycemia, such as sleepiness, dizziness, and confusion, are similar to symptoms of having too much  alcohol.  Do not drink alcohol if: ? Your health care provider tells you not to drink. ? You are pregnant, may be pregnant, or are planning to become pregnant.  If you drink alcohol: ? Do not drink on an empty stomach. ? Limit how much you use to:  0-1 drink a day for women.  0-2 drinks a day for men. ? Be aware of how much alcohol is in your drink. In the [place] one drink equals one 12 oz bottle of beer (355 mL), one 5 oz glass of wine (148 mL), or one 1 oz glass of hard liquor (44 mL). ? Keep yourself hydrated with water, diet soda, or unsweetened iced tea.  Keep in mind that regular soda, juice, and other mixers may contain a lot of sugar and must be counted as carbs. What are tips for following this plan? Reading food labels  Start by checking the serving size on the "Nutrition Facts" label of packaged foods and drinks. The amount of calories, carbs, fats, and other nutrients listed on the label is based on one serving of the item. Many items contain more than one serving per package.  Check the total grams (g) of carbs in one serving. You can calculate the number of servings of carbs in one serving by dividing the total carbs by 15. For example, if a food has 30 g of total carbs per serving, it would be equal to 2 servings of carbs.  Check the number of grams (g) of saturated fats and trans fats in one serving. Choose foods that have   a low amount or none of these fats.  Check the number of milligrams (mg) of salt (sodium) in one serving. Most people should limit total sodium intake to less than 2,300 mg per day.  Always check the nutrition information of foods labeled as "low-fat" or "nonfat." These foods may be higher in added sugar or refined carbs and should be avoided.  Talk to your dietitian to identify your daily goals for nutrients listed on the label. Shopping  Avoid buying canned, pre-made, or processed foods. These foods tend to be high in fat, sodium, and added  sugar.  Shop around the outside edge of the grocery store. This is where you will most often find fresh fruits and vegetables, bulk grains, fresh meats, and fresh dairy. Cooking  Use low-heat cooking methods, such as baking, instead of high-heat cooking methods like deep frying.  Cook using healthy oils, such as olive, canola, or sunflower oil.  Avoid cooking with butter, cream, or high-fat meats. Meal planning  Eat meals and snacks regularly, preferably at the same times every day. Avoid going long periods of time without eating.  Eat foods that are high in fiber, such as fresh fruits, vegetables, beans, and whole grains. Talk with your dietitian about how many servings of carbs you can eat at each meal.  Eat 4-6 oz (112-168 g) of lean protein each day, such as lean meat, chicken, fish, eggs, or tofu. One ounce (oz) of lean protein is equal to: ? 1 oz (28 g) of meat, chicken, or fish. ? 1 egg. ?  cup (62 g) of tofu.  Eat some foods each day that contain healthy fats, such as avocado, nuts, seeds, and fish.   What foods should I eat? Fruits Berries. Apples. Oranges. Peaches. Apricots. Plums. Grapes. Mango. Papaya. Pomegranate. Kiwi. Cherries. Vegetables Lettuce. Spinach. Leafy greens, including kale, chard, collard greens, and mustard greens. Beets. Cauliflower. Cabbage. Broccoli. Carrots. Green beans. Tomatoes. Peppers. Onions. Cucumbers. Brussels sprouts. Grains Whole grains, such as whole-wheat or whole-grain bread, crackers, tortillas, cereal, and pasta. Unsweetened oatmeal. Quinoa. Brown or wild rice. Meats and other proteins Seafood. Poultry without skin. Lean cuts of poultry and beef. Tofu. Nuts. Seeds. Dairy Low-fat or fat-free dairy products such as milk, yogurt, and cheese. The items listed above may not be a complete list of foods and beverages you can eat. Contact a dietitian for more information. What foods should I avoid? Fruits Fruits canned with  syrup. Vegetables Canned vegetables. Frozen vegetables with butter or cream sauce. Grains Refined white flour and flour products such as bread, pasta, snack foods, and cereals. Avoid all processed foods. Meats and other proteins Fatty cuts of meat. Poultry with skin. Breaded or fried meats. Processed meat. Avoid saturated fats. Dairy Full-fat yogurt, cheese, or milk. Beverages Sweetened drinks, such as soda or iced tea. The items listed above may not be a complete list of foods and beverages you should avoid. Contact a dietitian for more information. Questions to ask a health care provider  Do I need to meet with a diabetes educator?  Do I need to meet with a dietitian?  What number can I call if I have questions?  When are the best times to check my blood glucose? Where to find more information:  American Diabetes Association: diabetes.org  Academy of Nutrition and Dietetics: www.eatright.org  National Institute of Diabetes and Digestive and Kidney Diseases: www.niddk.nih.gov  Association of Diabetes Care and Education Specialists: www.diabeteseducator.org Summary  It is important to have healthy eating   habits because your blood sugar (glucose) levels are greatly affected by what you eat and drink.  A healthy meal plan will help you control your blood glucose and maintain a healthy lifestyle.  Your health care provider may recommend that you work with a dietitian to make a meal plan that is best for you.  Keep in mind that carbohydrates (carbs) and alcohol have immediate effects on your blood glucose levels. It is important to count carbs and to use alcohol carefully. This information is not intended to replace advice given to you by your health care provider. Make sure you discuss any questions you have with your health care provider. Document Revised: 11/06/2019 Document Reviewed: 11/06/2019 Elsevier Patient Education  2021 Elsevier Inc.  

## 2020-12-24 LAB — BASIC METABOLIC PANEL
BUN/Creatinine Ratio: 13 (ref 9–23)
BUN: 8 mg/dL (ref 6–24)
CO2: 31 mmol/L — ABNORMAL HIGH (ref 20–29)
Calcium: 9.1 mg/dL (ref 8.7–10.2)
Chloride: 101 mmol/L (ref 96–106)
Creatinine, Ser: 0.64 mg/dL (ref 0.57–1.00)
GFR calc Af Amer: 126 mL/min/{1.73_m2} (ref >59)
GFR calc non Af Amer: 109 mL/min/{1.73_m2} (ref >59)
Glucose: 129 mg/dL — ABNORMAL HIGH (ref 65–99)
Potassium: 4.4 mmol/L (ref 3.5–5.2)
Sodium: 141 mmol/L (ref 134–144)

## 2020-12-24 LAB — LIPID PANEL W/O CHOL/HDL RATIO
Cholesterol, Total: 168 mg/dL (ref 100–199)
HDL: 42 mg/dL (ref >39)
LDL Chol Calc (NIH): 110 mg/dL — ABNORMAL HIGH (ref 0–99)
Triglycerides: 86 mg/dL (ref 0–149)
VLDL Cholesterol Cal: 16 mg/dL (ref 5–40)

## 2020-12-24 NOTE — Progress Notes (Signed)
Contacted via MyChart   Good evening Amy [name], your labs have returned.  Kidney function is stable.  Your cholesterol is still high. Your LDL is above normal. The LDL is the bad cholesterol. Over time and in combination with inflammation and other factors, this contributes to plaque which in turn may lead to stroke and/or heart attack down the road. Sometimes high LDL is primarily genetic, and people might be eating all the right foods but still have high numbers. Other times, there is room for improvement in one's diet and eating healthier can bring this number down and potentially reduce one's risk of heart attack and/or stroke.   To reduce your LDL, Remember - more fruits and vegetables, more fish, and limit red meat and dairy products. More soy, nuts, beans, barley, lentils, oats and plant sterol ester enriched margarine instead of butter. I also encourage eliminating sugar and processed food. Remember, shop on the outside of the grocery store and visit your [employer]. If you would like to talk with me about dietary changes plus or minus medications for your cholesterol, please let me know. We should recheck your cholesterol in 3 months.  Any questions? Keep being awesome!!  Thank you for allowing me to participate in your care. Kindest regards, Naheem Mosco

## 2021-04-16 ENCOUNTER — Other Ambulatory Visit: Payer: Self-pay | Admitting: Family Medicine

## 2021-04-16 NOTE — Telephone Encounter (Signed)
Requested medication (s) are due for refill today: Due 04/28/21  Requested medication (s) are on the active medication list: yes  Last refill: 04/28/20   #12  3 refills  Future visit scheduled Yes 06/14/21  Notes to clinic:Not delegated. Last rx by R. Orene Desanctis, Future appt with Odette Fraction  Requested Prescriptions  Pending Prescriptions Disp Refills   Vitamin D, Ergocalciferol, (DRISDOL) 1.25 MG (50000 UNIT) CAPS capsule [Pharmacy Med Name: Vitamin D (Ergocalciferol) 1.25 MG (50000 UT) Oral Capsule] 12 capsule 0    Sig: Take 1 capsule by mouth once a week      Endocrinology:  Vitamins - Vitamin D Supplementation Failed - 04/16/2021  9:49 PM      Failed - 50,000 IU strengths are not delegated      Failed - Phosphate in normal range and within 360 days    Phosphorus  Date Value Ref Range Status  09/05/2017 3.2 2.5 - 4.5 mg/dL Final          Passed - Ca in normal range and within 360 days    Calcium  Date Value Ref Range Status  12/23/2020 9.1 8.7 - 10.2 mg/dL Final          Passed - Vitamin D in normal range and within 360 days    Vit D, 25-Hydroxy  Date Value Ref Range Status  09/29/2020 40.4 30.0 - 100.0 ng/mL Final    Comment:    Vitamin D deficiency has been defined by the Institute of Medicine and an Endocrine Society practice guideline as a level of serum 25-OH vitamin D less than 20 ng/mL (1,2). The Endocrine Society went on to further define vitamin D insufficiency as a level between 21 and 29 ng/mL (2). 1. IOM (Institute of Medicine). 2010. Dietary reference    intakes for calcium and D. [place]: The    [employer]. 2. Holick MF, Binkley [place], Bischoff-Ferrari HA, et al.    Evaluation, treatment, and prevention of vitamin D    deficiency: an Endocrine Society clinical practice    guideline. JCEM. 2011 Jul; 96(7):1911-30.           Passed - Valid encounter within last 12 months    Recent Outpatient Visits           3 months ago Type 2 diabetes  mellitus with morbid obesity ([place])   [place], Henrine Screws T, NP   6 months ago Routine general medical examination at a health care facility   [place], [place] T, NP   7 months ago Leg cramp   [place], Megan P, DO   11 months ago IFG (impaired fasting glucose)   [facility], Lilia Argue, [place]       Future Appointments             In 2 months [place], Barbaraann Faster, NP [employer]

## 2021-04-17 NOTE — Telephone Encounter (Signed)
Routing to provider  

## 2021-04-17 NOTE — Telephone Encounter (Signed)
Pt had apt on 12/23/2020 per note  Return in about 6 months (around 06/22/2021) for T2DM, HTN -- meet new PCP and need urine micro ALB. Pt is scheduled for 06/24/2021 would she need another apt?

## 2021-06-24 ENCOUNTER — Other Ambulatory Visit: Payer: Self-pay

## 2021-06-24 ENCOUNTER — Encounter: Payer: Self-pay | Admitting: Nurse Practitioner

## 2021-06-24 ENCOUNTER — Ambulatory Visit: Payer: Managed Care, Other (non HMO) | Admitting: Nurse Practitioner

## 2021-06-24 DIAGNOSIS — R0683 Snoring: Secondary | ICD-10-CM

## 2021-06-24 DIAGNOSIS — Z23 Encounter for immunization: Secondary | ICD-10-CM

## 2021-06-24 DIAGNOSIS — E559 Vitamin D deficiency, unspecified: Secondary | ICD-10-CM

## 2021-06-24 DIAGNOSIS — E785 Hyperlipidemia, unspecified: Secondary | ICD-10-CM

## 2021-06-24 DIAGNOSIS — E1169 Type 2 diabetes mellitus with other specified complication: Secondary | ICD-10-CM | POA: Diagnosis not present

## 2021-06-24 DIAGNOSIS — E039 Hypothyroidism, unspecified: Secondary | ICD-10-CM

## 2021-06-24 LAB — MICROALBUMIN, URINE WAIVED
Creatinine, Urine Waived: 200 mg/dL (ref 10–300)
Microalb, Ur Waived: 80 mg/L — ABNORMAL HIGH (ref 0–19)

## 2021-06-24 LAB — BAYER DCA HB A1C WAIVED: HB A1C (BAYER DCA - WAIVED): 6.3 % (ref <7.0)

## 2021-06-24 NOTE — Progress Notes (Signed)
BP 135/82   Pulse 96   Temp 99 F (37.2 C) (Oral)   Wt (!) 347 lb 9.6 oz (157.7 kg)   SpO2 94%   BMI 68.15 kg/m    Subjective:    Patient ID: Amy [name], female    DOB: Mar 08, 1976, 46 y.o.   MRN: [id number]  HPI: Amy [name] is a 45 y.o. female  Chief Complaint  Patient presents with   Diabetes   Hypertension   DIABETES Initial diagnosis October 2021 with A1c 6.7%, last A1c in January was 6.3%.  She is taking 500 MG at night and focused on diet changes.   Hypoglycemic episodes:no Polydipsia/polyuria: no Visual disturbance: no Chest pain: no Paresthesias: no Glucose Monitoring: yes  Accucheck frequency: Daily  Fasting glucose: often in 137 range, occasional below 100  Post prandial:  Evening:  Before meals: Taking Insulin?: no  Long acting insulin:  Short acting insulin: Blood Pressure Monitoring: not checking Retinal Examination: Up to Date Foot Exam: Up to Date Diabetic Education: Completed Pneumovax:  will perform today Influenza: Up to Date Aspirin: yes   HYPERLIPIDEMIA No current medications, is diet focused with LDL last labs -- 110. Supplements: none Aspirin:  no The 10-year ASCVD risk score Mikey Bussing DC Jr., et al., 2013) is: 1.9%   Values used to calculate the score:     Age: 24 years     Sex: Female     Is Non-Hispanic African American: No     Diabetic: Yes     Tobacco smoker: No     Systolic Blood Pressure: 622 mmHg     Is BP treated: No     HDL Cholesterol: 42 mg/dL     Total Cholesterol: 168 mg/dL Chest pain:  no Coronary artery disease:  no Family history CAD:  no Family history early CAD:  no  SNORING Has headaches in morning a lot and snores. Wakes feeling refreshed:   sometimes Daytime hypersomnolence:   occasional Fatigue:  yes Insomnia:  yes Good sleep hygiene:  yes Difficulty falling asleep:  no Difficulty staying asleep:  yes Snoring bothers bed partner:  yes Observed apnea by bed partner: no Obesity:   yes Hypertension: no  Pulmonary hypertension:  no Coronary artery disease:  no   HYPOTHYROIDISM Continues on Levothyroxine 50 MCG.  She reports noticing more hair loss lately. Thyroid control status:stable Satisfied with current treatment? yes Medication side effects: no Medication compliance: good compliance Etiology of hypothyroidism:  Recent dose adjustment:no Fatigue: no Cold intolerance: no Heat intolerance: no Weight gain: no Weight loss: no Constipation: no Diarrhea/loose stools: no Palpitations: no Lower extremity edema: no Anxiety/depressed mood: no   Relevant past medical, surgical, family and social history reviewed and updated as indicated. Interim medical history since our last visit reviewed. Allergies and medications reviewed and updated.  Review of Systems  Constitutional: Negative.   Respiratory: Negative.    Cardiovascular: Negative.   Gastrointestinal: Negative.   Neurological: Negative.   Psychiatric/Behavioral: Negative.     Per HPI unless specifically indicated above     Objective:    BP 135/82   Pulse 96   Temp 99 F (37.2 C) (Oral)   Wt (!) 347 lb 9.6 oz (157.7 kg)   SpO2 94%   BMI 68.15 kg/m   Wt Readings from Last 3 Encounters:  06/24/21 (!) 347 lb 9.6 oz (157.7 kg)  12/23/20 (!) 347 lb (157.4 kg)  11/25/20 (!) 343 lb 3.2 oz (155.7 kg)    Physical  Exam Vitals and nursing note reviewed.  Constitutional:      General: She is awake. She is not in acute distress.    Appearance: She is well-developed and well-groomed. She is morbidly obese. She is not ill-appearing.  HENT:     Head: Normocephalic.     Right Ear: Hearing normal.     Left Ear: Hearing normal.  Eyes:     General: Lids are normal.        Right eye: No discharge.        Left eye: No discharge.     Conjunctiva/sclera: Conjunctivae normal.     Pupils: Pupils are equal, round, and reactive to light.  Neck:     Thyroid: No thyromegaly.     Vascular: No carotid bruit.   Cardiovascular:     Rate and Rhythm: Normal rate and regular rhythm.     Heart sounds: Normal heart sounds. No murmur heard.   No gallop.  Pulmonary:     Effort: Pulmonary effort is normal. No accessory muscle usage or respiratory distress.     Breath sounds: Normal breath sounds.  Abdominal:     General: Bowel sounds are normal.     Palpations: Abdomen is soft.  Musculoskeletal:     Cervical back: Normal range of motion and neck supple.     Right lower leg: No edema.     Left lower leg: No edema.  Skin:    General: Skin is warm and dry.  Neurological:     Mental Status: She is alert and oriented to person, place, and time.  Psychiatric:        Attention and Perception: Attention normal.        Mood and Affect: Mood normal.        Speech: Speech normal.        Behavior: Behavior normal. Behavior is cooperative.        Thought Content: Thought content normal.   Results for orders placed or performed in visit on 06/24/21  Bayer DCA Hb A1c Waived  Result Value Ref Range   HB A1C (BAYER DCA - WAIVED) 6.3 <7.0 %  Microalbumin, Urine Waived  Result Value Ref Range   Microalb, Ur Waived 80 (H) 0 - 19 mg/L   Creatinine, Urine Waived 200 10 - 300 mg/dL   Microalb/Creat Ratio 30-300 (H) <30 mg/g     Assessment & Plan:   Problem List Items Addressed This Visit       Endocrine   Type 2 diabetes mellitus with morbid obesity ([place]) - Primary    Diagnosed in October 2021 -- A1c 6.7% at that time.  Today A1c 6.3%, remains stable with Metformin once daily.  Continue current medication regimen and adjust as needed + continue diet focus.  Continue to monitor BS daily.  Urine ALB 80, would benefit from ACE or ARB, which discussed with her at length today, she wishes to think about this.  Return in October for physical.       Relevant Orders   Bayer DCA Hb A1c Waived (Completed)   Microalbumin, Urine Waived (Completed)   Hyperlipidemia associated with type 2 diabetes mellitus (HCC)     Chronic, no current medications.  Recommend continued focus on diet and regular activity.  Initiate medication if patient agreeable -- discussed with patient importance of statin use in diabetics, but she wishes to hold off at this time -- Current ASCVD 1.9%.  Labs today.       Relevant Orders  Bayer DCA Hb A1c Waived (Completed)   Comprehensive metabolic panel   Lipid Panel w/o Chol/HDL Ratio   Hypothyroid    Chronic, ongoing.  Continue current Levothyroxine dose and adjust as needed based on labs.  TSH and Free T4 today + antibody today.       Relevant Orders   TSH   T4, free   Thyroid peroxidase antibody     Other   Morbid obesity (HCC)    BMI 68.15 with T2DM.  Recommended eating smaller high protein, low fat meals more frequently and exercising 30 mins a day 5 times a week with a goal of 10-15lb weight loss in the next 3 months. Patient voiced their understanding and motivation to adhere to these recommendations.        Vitamin D deficiency    Ongoing, continue weekly supplement and recheck level today.  Could consider changing to daily Vitamin D3 if level improving.       Relevant Orders   VITAMIN D 25 Hydroxy (Vit-D Deficiency, Fractures)   Snoring    Referral for sleep study, have high suspicion she has this and discussed at length with her benefit from treatment.  She would like to pursue sleep study.       Relevant Orders   Ambulatory referral to Sleep Studies   Other Visit Diagnoses     Need for Tdap vaccination       Tdap in office today.   Relevant Orders   Tdap vaccine greater than or equal to 7yo IM        Follow up plan: Return in about 14 weeks (around 09/30/2021) for Annual physical and diabetes check and pap.

## 2021-06-24 NOTE — Assessment & Plan Note (Signed)
Ongoing, continue weekly supplement and recheck level today.  Could consider changing to daily Vitamin D3 if level improving. 

## 2021-06-24 NOTE — Assessment & Plan Note (Signed)
Chronic, ongoing.  Continue current Levothyroxine dose and adjust as needed based on labs.  TSH and Free T4 today + antibody today.

## 2021-06-24 NOTE — Patient Instructions (Signed)
Diabetes Mellitus and Nutrition, Adult When you have diabetes, or diabetes mellitus, it is very important to have healthy eating habits because your blood sugar (glucose) levels are greatly affected by what you eat and drink. Eating healthy foods in the right amounts, at about the same times every day, can help you:  Control your blood glucose.  Lower your risk of heart disease.  Improve your blood pressure.  Reach or maintain a healthy weight. What can affect my meal plan? Every person with diabetes is different, and each person has different needs for a meal plan. Your health care provider may recommend that you work with a dietitian to make a meal plan that is best for you. Your meal plan may vary depending on factors such as:  The calories you need.  The medicines you take.  Your weight.  Your blood glucose, blood pressure, and cholesterol levels.  Your activity level.  Other health conditions you have, such as heart or kidney disease. How do carbohydrates affect me? Carbohydrates, also called carbs, affect your blood glucose level more than any other type of food. Eating carbs naturally raises the amount of glucose in your blood. Carb counting is a method for keeping track of how many carbs you eat. Counting carbs is important to keep your blood glucose at a healthy level, especially if you use insulin or take certain oral diabetes medicines. It is important to know how many carbs you can safely have in each meal. This is different for every person. Your dietitian can help you calculate how many carbs you should have at each meal and for each snack. How does alcohol affect me? Alcohol can cause a sudden decrease in blood glucose (hypoglycemia), especially if you use insulin or take certain oral diabetes medicines. Hypoglycemia can be a life-threatening condition. Symptoms of hypoglycemia, such as sleepiness, dizziness, and confusion, are similar to symptoms of having too much  alcohol.  Do not drink alcohol if: ? Your health care provider tells you not to drink. ? You are pregnant, may be pregnant, or are planning to become pregnant.  If you drink alcohol: ? Do not drink on an empty stomach. ? Limit how much you use to:  0-1 drink a day for women.  0-2 drinks a day for men. ? Be aware of how much alcohol is in your drink. In the [place] one drink equals one 12 oz bottle of beer (355 mL), one 5 oz glass of wine (148 mL), or one 1 oz glass of hard liquor (44 mL). ? Keep yourself hydrated with water, diet soda, or unsweetened iced tea.  Keep in mind that regular soda, juice, and other mixers may contain a lot of sugar and must be counted as carbs. What are tips for following this plan? Reading food labels  Start by checking the serving size on the "Nutrition Facts" label of packaged foods and drinks. The amount of calories, carbs, fats, and other nutrients listed on the label is based on one serving of the item. Many items contain more than one serving per package.  Check the total grams (g) of carbs in one serving. You can calculate the number of servings of carbs in one serving by dividing the total carbs by 15. For example, if a food has 30 g of total carbs per serving, it would be equal to 2 servings of carbs.  Check the number of grams (g) of saturated fats and trans fats in one serving. Choose foods that have   a low amount or none of these fats.  Check the number of milligrams (mg) of salt (sodium) in one serving. Most people should limit total sodium intake to less than 2,300 mg per day.  Always check the nutrition information of foods labeled as "low-fat" or "nonfat." These foods may be higher in added sugar or refined carbs and should be avoided.  Talk to your dietitian to identify your daily goals for nutrients listed on the label. Shopping  Avoid buying canned, pre-made, or processed foods. These foods tend to be high in fat, sodium, and added  sugar.  Shop around the outside edge of the grocery store. This is where you will most often find fresh fruits and vegetables, bulk grains, fresh meats, and fresh dairy. Cooking  Use low-heat cooking methods, such as baking, instead of high-heat cooking methods like deep frying.  Cook using healthy oils, such as olive, canola, or sunflower oil.  Avoid cooking with butter, cream, or high-fat meats. Meal planning  Eat meals and snacks regularly, preferably at the same times every day. Avoid going long periods of time without eating.  Eat foods that are high in fiber, such as fresh fruits, vegetables, beans, and whole grains. Talk with your dietitian about how many servings of carbs you can eat at each meal.  Eat 4-6 oz (112-168 g) of lean protein each day, such as lean meat, chicken, fish, eggs, or tofu. One ounce (oz) of lean protein is equal to: ? 1 oz (28 g) of meat, chicken, or fish. ? 1 egg. ?  cup (62 g) of tofu.  Eat some foods each day that contain healthy fats, such as avocado, nuts, seeds, and fish.   What foods should I eat? Fruits Berries. Apples. Oranges. Peaches. Apricots. Plums. Grapes. Mango. Papaya. Pomegranate. Kiwi. Cherries. Vegetables Lettuce. Spinach. Leafy greens, including kale, chard, collard greens, and mustard greens. Beets. Cauliflower. Cabbage. Broccoli. Carrots. Green beans. Tomatoes. Peppers. Onions. Cucumbers. Brussels sprouts. Grains Whole grains, such as whole-wheat or whole-grain bread, crackers, tortillas, cereal, and pasta. Unsweetened oatmeal. Quinoa. Brown or wild rice. Meats and other proteins Seafood. Poultry without skin. Lean cuts of poultry and beef. Tofu. Nuts. Seeds. Dairy Low-fat or fat-free dairy products such as milk, yogurt, and cheese. The items listed above may not be a complete list of foods and beverages you can eat. Contact a dietitian for more information. What foods should I avoid? Fruits Fruits canned with  syrup. Vegetables Canned vegetables. Frozen vegetables with butter or cream sauce. Grains Refined white flour and flour products such as bread, pasta, snack foods, and cereals. Avoid all processed foods. Meats and other proteins Fatty cuts of meat. Poultry with skin. Breaded or fried meats. Processed meat. Avoid saturated fats. Dairy Full-fat yogurt, cheese, or milk. Beverages Sweetened drinks, such as soda or iced tea. The items listed above may not be a complete list of foods and beverages you should avoid. Contact a dietitian for more information. Questions to ask a health care provider  Do I need to meet with a diabetes educator?  Do I need to meet with a dietitian?  What number can I call if I have questions?  When are the best times to check my blood glucose? Where to find more information:  American Diabetes Association: diabetes.org  Academy of Nutrition and Dietetics: www.eatright.org  National Institute of Diabetes and Digestive and Kidney Diseases: www.niddk.nih.gov  Association of Diabetes Care and Education Specialists: www.diabeteseducator.org Summary  It is important to have healthy eating   habits because your blood sugar (glucose) levels are greatly affected by what you eat and drink.  A healthy meal plan will help you control your blood glucose and maintain a healthy lifestyle.  Your health care provider may recommend that you work with a dietitian to make a meal plan that is best for you.  Keep in mind that carbohydrates (carbs) and alcohol have immediate effects on your blood glucose levels. It is important to count carbs and to use alcohol carefully. This information is not intended to replace advice given to you by your health care provider. Make sure you discuss any questions you have with your health care provider. Document Revised: 11/06/2019 Document Reviewed: 11/06/2019 Elsevier Patient Education  2021 Elsevier Inc.  

## 2021-06-24 NOTE — Assessment & Plan Note (Addendum)
Diagnosed in October 2021 -- A1c 6.7% at that time.  Today A1c 6.3%, remains stable with Metformin once daily.  Continue current medication regimen and adjust as needed + continue diet focus.  Continue to monitor BS daily.  Urine ALB 80, would benefit from ACE or ARB, which discussed with her at length today, she wishes to think about this.  Return in October for physical.

## 2021-06-24 NOTE — Assessment & Plan Note (Signed)
Referral for sleep study, have high suspicion she has this and discussed at length with her benefit from treatment.  She would like to pursue sleep study.

## 2021-06-24 NOTE — Assessment & Plan Note (Signed)
Chronic, no current medications.  Recommend continued focus on diet and regular activity.  Initiate medication if patient agreeable -- discussed with patient importance of statin use in diabetics, but she wishes to hold off at this time -- Current ASCVD 1.9%.  Labs today.

## 2021-06-24 NOTE — Assessment & Plan Note (Signed)
BMI 68.15 with T2DM.  Recommended eating smaller high protein, low fat meals more frequently and exercising 30 mins a day 5 times a week with a goal of 10-15lb weight loss in the next 3 months. Patient voiced their understanding and motivation to adhere to these recommendations.

## 2021-06-25 LAB — COMPREHENSIVE METABOLIC PANEL
ALT: 12 IU/L (ref 0–32)
AST: 28 IU/L (ref 0–40)
Albumin/Globulin Ratio: 1.1 — ABNORMAL LOW (ref 1.2–2.2)
Albumin: 3.6 g/dL — ABNORMAL LOW (ref 3.8–4.8)
Alkaline Phosphatase: 104 IU/L (ref 44–121)
BUN/Creatinine Ratio: 12 (ref 9–23)
BUN: 8 mg/dL (ref 6–24)
Bilirubin Total: 0.8 mg/dL (ref 0.0–1.2)
CO2: 28 mmol/L (ref 20–29)
Calcium: 9.1 mg/dL (ref 8.7–10.2)
Chloride: 100 mmol/L (ref 96–106)
Creatinine, Ser: 0.67 mg/dL (ref 0.57–1.00)
Globulin, Total: 3.4 g/dL (ref 1.5–4.5)
Glucose: 120 mg/dL — ABNORMAL HIGH (ref 65–99)
Potassium: 3.8 mmol/L (ref 3.5–5.2)
Sodium: 142 mmol/L (ref 134–144)
Total Protein: 7 g/dL (ref 6.0–8.5)
eGFR: 110 mL/min/{1.73_m2} (ref >59)

## 2021-06-25 LAB — T4, FREE: Free T4: 1.24 ng/dL (ref 0.82–1.77)

## 2021-06-25 LAB — LIPID PANEL W/O CHOL/HDL RATIO
Cholesterol, Total: 170 mg/dL (ref 100–199)
HDL: 44 mg/dL (ref >39)
LDL Chol Calc (NIH): 111 mg/dL — ABNORMAL HIGH (ref 0–99)
Triglycerides: 77 mg/dL (ref 0–149)
VLDL Cholesterol Cal: 15 mg/dL (ref 5–40)

## 2021-06-25 LAB — VITAMIN D 25 HYDROXY (VIT D DEFICIENCY, FRACTURES): Vit D, 25-Hydroxy: 57.3 ng/mL (ref 30.0–100.0)

## 2021-06-25 LAB — TSH: TSH: 4.24 u[IU]/mL (ref 0.450–4.500)

## 2021-06-25 LAB — THYROID PEROXIDASE ANTIBODY: Thyroperoxidase Ab SerPl-aCnc: <8 IU/mL (ref 0–34)

## 2021-06-25 MED ORDER — LOSARTAN POTASSIUM 25 MG PO TABS: 25.0000 mg | ORAL_TABLET | Freq: Every day | ORAL | 4 refills | Status: DC

## 2021-06-25 MED ORDER — ROSUVASTATIN CALCIUM 10 MG PO TABS: 10.0000 mg | ORAL_TABLET | Freq: Every day | ORAL | 4 refills | Status: DC

## 2021-06-25 NOTE — Progress Notes (Signed)
Contacted via MyChart   I am also sending in a blood pressure medication called Losartan, as the protein in your urine is elevated at 80.  I would like to start you on this to help the proteinuria and keep kidneys safe with you diabetes.  Any questions?

## 2021-06-25 NOTE — Addendum Note (Signed)
Addended by: Marnee Guarneri T on: 06/25/2021 07:17 PM   Modules accepted: Orders

## 2021-06-25 NOTE — Progress Notes (Signed)
Contacted via MyChart   Good evening Amy [name], your labs have returned.  Kidney function, creatinine and eGFR, is normal.  Liver function, AST and ALT, is normal.  Thyroid labs are stable, continue current medication.  Vitamin D level normal.  Your LDL, bad cholesterol, is increased.  As discussed I would recommend starting a statin, like Rosuvastatin, daily to help lower and prevent stroke or heart attack.  I will send this in at low dose to start.  If you ever become pregnant, stop taking this.  We will recheck levels next visit. If extreme muscle aches present to legs with this medication, stop and let me know right away.  Any questions? Keep being awesome!!  Thank you for allowing me to participate in your care.  I appreciate you. Kindest regards, Jamesina Gaugh

## 2021-07-02 ENCOUNTER — Other Ambulatory Visit: Payer: Self-pay | Admitting: Nurse Practitioner

## 2021-07-02 NOTE — Telephone Encounter (Signed)
   Requested medications are on the active medication list yes  Last refill 5/6  Last visit 7/13  Future visit scheduled 10/20  Notes to clinic Not Delegated

## 2021-07-07 NOTE — Telephone Encounter (Signed)
Pt next apt on 10/01/2021

## 2021-07-16 ENCOUNTER — Ambulatory Visit: Payer: Managed Care, Other (non HMO) | Admitting: Nurse Practitioner

## 2021-07-16 ENCOUNTER — Other Ambulatory Visit: Payer: Self-pay

## 2021-07-16 ENCOUNTER — Encounter: Payer: Self-pay | Admitting: Nurse Practitioner

## 2021-07-16 ENCOUNTER — Ambulatory Visit: Payer: Self-pay | Admitting: *Deleted

## 2021-07-16 VITALS — BP 140/84 | HR 84 | Temp 98.7°F | Wt 343.2 lb

## 2021-07-16 DIAGNOSIS — M25511 Pain in right shoulder: Secondary | ICD-10-CM

## 2021-07-16 DIAGNOSIS — H538 Other visual disturbances: Secondary | ICD-10-CM

## 2021-07-16 NOTE — Telephone Encounter (Signed)
Reason for Disposition  [1] Symptoms of high blood sugar (e.g., frequent urination, weak, weight loss) AND [2] not able to test blood glucose    Something's not right with me.   I don't know what is wrong.  Answer Assessment - Initial Assessment Questions 1. BLOOD GLUCOSE: "What is your blood glucose level?"      Glucose is 140 and I'm shaking and my vision is blurry.    I'm having diarrhea and my right shoulder is hurting.    The 140 was 7:00 this morning.    It's usually around 130.   No recent illnesses.   The diarrhea started yesterday.  I'm having diarrhea after every time I eat.   No body aches or chills.   I don't know what's causing the shaking.  Blurry vision started yesterday too.    Right shoulder is tight.   I can move it.   Tight since yesterday.   No shortness of breath or breaking out in sweats.   I'm having tightness in the back part of my shoulder on the right.    No numbness or tingling down right arm.    No fever I know of.   No sick exposures I'm aware of. 2. ONSET: "When did you check the blood glucose?"     7:00 this morning  Do not use insulin 3. USUAL RANGE: "What is your glucose level usually?" (e.g., usual fasting morning value, usual evening value)     130 in the morning usually 4. KETONES: "Do you check for ketones (urine or blood test strips)?" If yes, ask: "What does the test show now?"      Not asked  I rechecked my sugar at 8:00 and it was 120.   I did not eat anything in between the readings.   I've not eaten anything today.   I wasn't [place] and I don't feel like eating.   No vomiting just nauseated.     Not tested for Covid 5. TYPE 1 or 2:  "Do you know what type of diabetes you have?"  (e.g., Type 1, Type 2, Gestational; doesn't know)      Type 2    Metformin one a day. 6. INSULIN: "Do you take insulin?" "What type of insulin(s) do you use? What is the mode of delivery? (syringe, pen; injection or pump)?"      No 7. DIABETES PILLS: "Do you take any pills for  your diabetes?" If yes, ask: "Have you missed taking any pills recently?"     Metformin once daily 8. OTHER SYMPTOMS: "Do you have any symptoms?" (e.g., fever, frequent urination, difficulty breathing, dizziness, weakness, vomiting)     Blurry vision and shaking.   It's a nervous shaking.   I'm not nervous about anything. I have no idea what is going on with me. 9. PREGNANCY: "Is there any chance you are pregnant?" "When was your last menstrual period?"     No.    No period for a year now.  Protocols used: Diabetes - High Blood Sugar-A-AH

## 2021-07-16 NOTE — Telephone Encounter (Signed)
Pt returned call from where a nurse had called her earlier today.   She is c/o having blurry vision, shaking and having diarrhea that all started yesterday.    She had a glucose of 140 at 7:00 this morning.   She has not eaten anything today because she doesn't have an appetite and is having diarrhea.   She rechecked her glucose at 8:00 and it's down to 120 now without having eaten anything.   My right shoulder is hurting also.   Denies any injuries or falls.   No sick contacts that she is aware of.   Has not tested for Covid.  After triaging her and I was looking for an appt to make for her, her husband got on the line and let me know she already had an appt set up for today at 1:20.   I looked and she had an appt with Vance Peper for today at 1:20 made by the agent she talked with earlier.   He was asking why she needed to talk with a nurse because they missed the call when the nurse tried to call earlier.   I let him know to be sure she wasn't in a crisis situation with her glucose and symptoms and where she may need to go to the ED however after talking with her the appt she has for today is fine.  So she will come in for the 1:20 appt today.    They were agreeable to this and thanked me for clarifying the reason for the call from the nurse.

## 2021-07-16 NOTE — Progress Notes (Signed)
Acute Office Visit  Subjective:    Patient ID: Amy [name], female    DOB: Feb 06, 1976, 45 y.o.   MRN: [id number]  Chief Complaint  Patient presents with   Shaking    Patient states she became symptomatic yesterday morning around 11 while taking her daughter to the hospital. Patient states she noticed that her vision became blurry as she cannot see words far away. Patient state she has not had a recent eye exam since last year. Patient state was experiencing diarrhea, but that has since stopped. Patient state she checked her sugars today at 7am and it was 141 and then again at 8:13 and reading was 120.    Blurred Vision   Shoulder Pain   Diarrhea   Nausea    HPI Patient is in today for an episode of shaking and blurry vision that came on suddenly while she was driving yesterday morning. She left her house and was going to see her daughter when her vision got blurry and she felt shaky and had to pull off the highway and have someone else drive her back home. Her blood sugar was 141. She checked her sugar again an hour later and it was 120. She also endorses right chest pain that radiates to right shoulder. She denies recent injury and is able to move her arm with no issues. She states that something doesn't feel right and this is not normal for her. Today her symptoms are improving, her vision is still slightly blurry, however she is shaking less. She still feels like something isn't right. She denies chest pain, and shortness of breath. She did have some diarrhea yesterday, however that happens intermittently with the metformin.   Of note she was started on new medication: losartan and crestor a few weeks ago, however she just picked it up from the pharmacy and has not started taking it yet.   Past Medical History:  Diagnosis Date   Allergy    Asthma    Diabetes mellitus without complication ([place])    Obesity    Psoriasis     Past Surgical History:  Procedure Laterality Date    CESAREAN SECTION     X 2   HAND SURGERY Right    THUMB   HERNIA REPAIR     right thumb surgery      Family History  Problem Relation Age of Onset   Hypertension Mother    Thyroid disease Mother    Autoimmune disease Mother    Anemia Mother    Cirrhosis Father    Glaucoma Father    Asthma Daughter    Allergies Daughter    ADD / ADHD Son    Thyroid disease Maternal Grandmother    Osteoporosis Maternal Grandmother    Hypertension Maternal Grandmother    Diabetes Maternal Grandmother    Congestive Heart Failure Paternal Grandmother    Appendicitis Paternal Grandfather     Social History   Socioeconomic History   Marital status: Married    Spouse name: Not on file   Number of children: Not on file   Years of education: Not on file   Highest education level: Not on file  Occupational History   Not on file  Tobacco Use   Smoking status: Former    Packs/day: 0.50    Years: 5.00    Pack years: 2.50    Types: Cigarettes    Quit date: 01/04/2005    Years since quitting: 16.5   Smokeless tobacco: Never  Vaping Use   Vaping Use: Never used  Substance and Sexual Activity   Alcohol use: No    Alcohol/week: 0.0 standard drinks   Drug use: No   Sexual activity: Not on file  Other Topics Concern   Not on file  Social History Narrative   Not on file   Social Determinants of Health   Financial Resource Strain: Not on file  Food Insecurity: Not on file  Transportation Needs: Not on file  Physical Activity: Not on file  Stress: Not on file  Social Connections: Not on file  Intimate Partner Violence: Not on file    Outpatient Medications Prior to Visit  Medication Sig Dispense Refill   Blood Glucose Monitoring Suppl (ONETOUCH VERIO) w/Device KIT Use to check blood sugar once a day, first thing in morning fasting, with goal blood sugar < 130. 1 kit 0   clobetasol ointment (TEMOVATE) 9.57 % Apply 1 application topically 2 (two) times daily. 45 g 1   glucose blood  (ONETOUCH VERIO) test strip Use to check blood sugar once daily, fasting. 100 each 12   Lancets (ONETOUCH ULTRASOFT) lancets Use to check blood sugar once daily, fasting. 100 each 12   levocetirizine (XYZAL) 5 MG tablet Take 1 tablet (5 mg total) by mouth daily. 90 tablet 3   levothyroxine (SYNTHROID) 50 MCG tablet Take 1 tablet (50 mcg total) by mouth daily. 90 tablet 4   losartan (COZAAR) 25 MG tablet Take 1 tablet (25 mg total) by mouth daily. 90 tablet 4   Multiple Vitamin (MULTIVITAMINS PO) Take 1 tablet by mouth daily.      Vitamin D, Ergocalciferol, (DRISDOL) 1.25 MG (50000 UNIT) CAPS capsule Take 1 capsule by mouth once a week 12 capsule 3   metFORMIN (GLUCOPHAGE) 500 MG tablet Start out taking 1 tablet (500 MG) by mouth daily at bedtime. (Patient not taking: Reported on 07/16/2021) 90 tablet 3   rosuvastatin (CRESTOR) 10 MG tablet Take 1 tablet (10 mg total) by mouth daily. (Patient not taking: Reported on 07/16/2021) 90 tablet 4   No facility-administered medications prior to visit.    Allergies  Allergen Reactions   Biaxin [Clarithromycin] Nausea And Vomiting    Review of Systems  Constitutional:  Positive for fatigue. Negative for appetite change and fever.  HENT: Negative.    Eyes:  Positive for visual disturbance (blurry vision).  Respiratory: Negative.    Cardiovascular: Negative.   Gastrointestinal:  Positive for diarrhea and nausea. Negative for abdominal pain.  Genitourinary: Negative.   Musculoskeletal:  Positive for arthralgias (right shoulder pain).  Skin: Negative.   Neurological:  Positive for headaches (chronic, stable). Negative for dizziness and weakness.      Objective:    Physical Exam Vitals and nursing note reviewed.  Constitutional:      General: She is not in acute distress.    Appearance: Normal appearance.  HENT:     Head: Normocephalic.  Eyes:     Conjunctiva/sclera: Conjunctivae normal.  Cardiovascular:     Rate and Rhythm: Normal rate and  regular rhythm.     Pulses: Normal pulses.     Heart sounds: Normal heart sounds.  Pulmonary:     Effort: Pulmonary effort is normal.     Breath sounds: Normal breath sounds.  Abdominal:     Palpations: Abdomen is soft.     Tenderness: There is no abdominal tenderness.  Musculoskeletal:        General: Normal range of motion.  Cervical back: Normal range of motion.     Comments: Mild tenderness to palpation of right chest wall  Skin:    General: Skin is warm.  Neurological:     General: No focal deficit present.     Mental Status: She is alert and oriented to person, place, and time.  Psychiatric:        Mood and Affect: Mood normal.        Behavior: Behavior normal.        Thought Content: Thought content normal.        Judgment: Judgment normal.    BP 140/84   Pulse 84   Temp 98.7 F (37.1 C) (Oral)   Wt (!) 343 lb 3.2 oz (155.7 kg)   BMI 67.29 kg/m  Wt Readings from Last 3 Encounters:  07/16/21 (!) 343 lb 3.2 oz (155.7 kg)  06/24/21 (!) 347 lb 9.6 oz (157.7 kg)  12/23/20 (!) 347 lb (157.4 kg)    Health Maintenance Due  Topic Date Due   COVID-19 Vaccine (1) Never done   PAP SMEAR-Modifier  Never done   INFLUENZA VACCINE  07/13/2021    There are no preventive care reminders to display for this patient.   Lab Results  Component Value Date   TSH 4.240 06/24/2021   Lab Results  Component Value Date   WBC 9.2 09/29/2020   HGB 14.9 09/29/2020   HCT 44.7 09/29/2020   MCV 87 09/29/2020   PLT 221 09/29/2020   Lab Results  Component Value Date   NA 142 06/24/2021   K 3.8 06/24/2021   CO2 28 06/24/2021   GLUCOSE 120 (H) 06/24/2021   BUN 8 06/24/2021   CREATININE 0.67 06/24/2021   BILITOT 0.8 06/24/2021   ALKPHOS 104 06/24/2021   AST 28 06/24/2021   ALT 12 06/24/2021   PROT 7.0 06/24/2021   ALBUMIN 3.6 (L) 06/24/2021   CALCIUM 9.1 06/24/2021   EGFR 110 06/24/2021   Lab Results  Component Value Date   CHOL 170 06/24/2021   Lab Results   Component Value Date   HDL 44 06/24/2021   Lab Results  Component Value Date   LDLCALC 111 (H) 06/24/2021   Lab Results  Component Value Date   TRIG 77 06/24/2021   Lab Results  Component Value Date   CHOLHDL 3.5 09/05/2017   Lab Results  Component Value Date   HGBA1C 6.3 06/24/2021       Assessment & Plan:   Problem List Items Addressed This Visit   None Visit Diagnoses     Blurry vision    -  Primary   No red flags on exam. Will check CMP, CBC, and TSH. Encouraged her to make an appointment with ophthamologist asap. Follow up in 1 week.   Relevant Orders   Comp Met (CMET)   CBC with Differential   TSH   Acute pain of right shoulder       Most likely musculoskeletal. Check mg, phos. Can take ibuprofen/tylenol as needed and use warm compresses to chest/shoulder. Follow-up in 1 week.   Relevant Orders   Magnesium   Phosphorus        No orders of the defined types were placed in this encounter.    Charyl Dancer, NP

## 2021-07-16 NOTE — Telephone Encounter (Signed)
noted 

## 2021-07-16 NOTE — Patient Instructions (Addendum)
Drink plenty of fluids Check blood work today Call your eye dr to schedule an appointment You can take tylenol and use heat or ice on your right chest/shoulder Don't start your losartan or crestor until you follow up next week

## 2021-07-17 ENCOUNTER — Telehealth: Payer: Self-pay

## 2021-07-17 LAB — TSH: TSH: 2.15 u[IU]/mL (ref 0.450–4.500)

## 2021-07-17 LAB — CBC WITH DIFFERENTIAL/PLATELET
Basophils Absolute: 0.1 10*3/uL (ref 0.0–0.2)
Basos: 1 %
EOS (ABSOLUTE): 0.1 10*3/uL (ref 0.0–0.4)
Eos: 1 %
Hematocrit: 43.5 % (ref 34.0–46.6)
Hemoglobin: 14.1 g/dL (ref 11.1–15.9)
Immature Grans (Abs): 0 10*3/uL (ref 0.0–0.1)
Immature Granulocytes: 0 %
Lymphocytes Absolute: 2.5 10*3/uL (ref 0.7–3.1)
Lymphs: 25 %
MCH: 28.4 pg (ref 26.6–33.0)
MCHC: 32.4 g/dL (ref 31.5–35.7)
MCV: 88 fL (ref 79–97)
Monocytes Absolute: 0.8 10*3/uL (ref 0.1–0.9)
Monocytes: 7 %
Neutrophils Absolute: 6.7 10*3/uL (ref 1.4–7.0)
Neutrophils: 66 %
Platelets: 196 10*3/uL (ref 150–450)
RBC: 4.96 x10E6/uL (ref 3.77–5.28)
RDW: 13.3 % (ref 11.7–15.4)
WBC: 10.2 10*3/uL (ref 3.4–10.8)

## 2021-07-17 LAB — COMPREHENSIVE METABOLIC PANEL WITH GFR
ALT: 14 [IU]/L (ref 0–32)
AST: 34 [IU]/L (ref 0–40)
Albumin/Globulin Ratio: 1.1 — ABNORMAL LOW (ref 1.2–2.2)
Albumin: 3.9 g/dL (ref 3.8–4.8)
Alkaline Phosphatase: 98 [IU]/L (ref 44–121)
BUN/Creatinine Ratio: 14 (ref 9–23)
BUN: 9 mg/dL (ref 6–24)
Bilirubin Total: 0.7 mg/dL (ref 0.0–1.2)
CO2: 28 mmol/L (ref 20–29)
Calcium: 9.4 mg/dL (ref 8.7–10.2)
Chloride: 100 mmol/L (ref 96–106)
Creatinine, Ser: 0.65 mg/dL (ref 0.57–1.00)
Globulin, Total: 3.4 g/dL (ref 1.5–4.5)
Glucose: 157 mg/dL — ABNORMAL HIGH (ref 65–99)
Potassium: 3.7 mmol/L (ref 3.5–5.2)
Sodium: 141 mmol/L (ref 134–144)
Total Protein: 7.3 g/dL (ref 6.0–8.5)
eGFR: 111 mL/min/{1.73_m2}

## 2021-07-17 LAB — PHOSPHORUS: Phosphorus: 3.1 mg/dL (ref 3.0–4.3)

## 2021-07-17 LAB — MAGNESIUM: Magnesium: 2.1 mg/dL (ref 1.6–2.3)

## 2021-07-17 NOTE — Telephone Encounter (Signed)
Copied from [place] [id number]. Topic: General - Inquiry >> Jul 17, 2021 12:55 PM Greggory Keen D wrote: Reason for CRM: Pt was in yesterday for high BP.  She was given medication but told not to take it yet.  She said today it is 171/105 and she wants to know if she should or can go ahead and start the medication.   She seen Lauren yesterday.  CB#  [phone]

## 2021-07-17 NOTE — Telephone Encounter (Signed)
Patient notified of Lauren's instructions and verbalized understanding.

## 2021-07-17 NOTE — Telephone Encounter (Signed)
Routing to Lauren to advise on patient's blood pressure.

## 2021-07-17 NOTE — Telephone Encounter (Signed)
Yes, please have her start the blood pressure medication. I had sent her a mychart message as well. Please have her send in her blood pressure reading tomorrow. If she develops headache, chest pain go to ER.

## 2021-07-23 ENCOUNTER — Other Ambulatory Visit: Payer: Self-pay

## 2021-07-23 ENCOUNTER — Ambulatory Visit: Payer: Managed Care, Other (non HMO) | Admitting: Nurse Practitioner

## 2021-07-23 ENCOUNTER — Encounter: Payer: Self-pay | Admitting: Nurse Practitioner

## 2021-07-23 VITALS — BP 140/70 | HR 88 | Temp 98.6°F | Ht 59.88 in | Wt 338.8 lb

## 2021-07-23 DIAGNOSIS — E1159 Type 2 diabetes mellitus with other circulatory complications: Secondary | ICD-10-CM | POA: Diagnosis not present

## 2021-07-23 DIAGNOSIS — I152 Hypertension secondary to endocrine disorders: Secondary | ICD-10-CM | POA: Diagnosis not present

## 2021-07-23 NOTE — Progress Notes (Signed)
Established Patient Office Visit  Subjective:  Patient ID: Amy [name], female    DOB: 1976/02/04  Age: 45 y.o. MRN: [id number]  CC:  Chief Complaint  Patient presents with   Hypertension    Patient states BP is still high    HPI Amy [name] presents for hypertension. Her symptoms of blurry vision and shaking have resolved. She has noted fluctuating blood pressure readings over the past week that range from 150s-low 200s. She can feel in her face when her blood pressure is up, along with some chest tightness. She went to the ER yesterday. Labs reviewed and chest x-ray negative for acute disease. She has been taking losartan 62m daily x6 days.   Past Medical History:  Diagnosis Date   Allergy    Asthma    Di[place]    Obesity    Psoriasis     Past Surgical History:  Procedure Laterality Date   CESAREAN SECTION     X 2   HAND SURGERY Right    THUMB   HERNIA REPAIR     right thumb surgery      Family History  Problem Relation Age of Onset   Hypertension Mother    Thyroid disease Mother    Autoimmune disease Mother    Anemia Mother    Cirrhosis Father    Glaucoma Father    Asthma Daughter    Allergies Daughter    ADD / ADHD Son    Thyroid disease Maternal Grandmother    Osteoporosis Maternal Grandmother    Hypertension Maternal Grandmother    Diabetes Maternal Grandmother    Congestive Heart Failure Paternal Grandmother    Appendicitis Paternal Grandfather     Social History   Socioeconomic History   Marital status: Married    Spouse name: Not on file   Number of children: Not on file   Years of education: Not on file   Highest education level: Not on file  Occupational History   Not on file  Tobacco Use   Smoking status: Former    Packs/day: 0.50    Years: 5.00    Pack years: 2.50    Types: Cigarettes    Quit date: 01/04/2005    Years since quitting: 16.5   Smokeless tobacco: Never  Vaping Use    Vaping Use: Never used  Substance and Sexual Activity   Alcohol use: No    Alcohol/week: 0.0 standard drinks   Drug use: No   Sexual activity: Not on file  Other Topics Concern   Not on file  Social History Narrative   Not on file   Social Determinants of Health   Financial Resource Strain: Not on file  Food Insecurity: Not on file  Transportation Needs: Not on file  Physical Activity: Not on file  Stress: Not on file  Social Connections: Not on file  Intimate Partner Violence: Not on file    Outpatient Medications Prior to Visit  Medication Sig Dispense Refill   Blood Glucose Monitoring Suppl (ONETOUCH VERIO) w/Device KIT Use to check blood sugar once a day, first thing in morning fasting, with goal blood sugar < 130. 1 kit 0   clobetasol ointment (TEMOVATE) 05.62% Apply 1 application topically 2 (two) times daily. 45 g 1   glucose blood (ONETOUCH VERIO) test strip Use to check blood sugar once daily, fasting. 100 each 12   Lancets (ONETOUCH ULTRASOFT) lancets Use to check blood sugar once daily, fasting. 100  each 12   levocetirizine (XYZAL) 5 MG tablet Take 1 tablet (5 mg total) by mouth daily. 90 tablet 3   levothyroxine (SYNTHROID) 50 MCG tablet Take 1 tablet (50 mcg total) by mouth daily. 90 tablet 4   losartan (COZAAR) 25 MG tablet Take 1 tablet (25 mg total) by mouth daily. 90 tablet 4   metFORMIN (GLUCOPHAGE) 500 MG tablet Start out taking 1 tablet (500 MG) by mouth daily at bedtime. 90 tablet 3   Multiple Vitamin (MULTIVITAMINS PO) Take 1 tablet by mouth daily.      rosuvastatin (CRESTOR) 10 MG tablet Take 1 tablet (10 mg total) by mouth daily. 90 tablet 4   Vitamin D, Ergocalciferol, (DRISDOL) 1.25 MG (50000 UNIT) CAPS capsule Take 1 capsule by mouth once a week 12 capsule 3   No facility-administered medications prior to visit.    Allergies  Allergen Reactions   Biaxin [Clarithromycin] Nausea And Vomiting    ROS Review of Systems  Constitutional:  Positive for  fatigue.  HENT: Negative.    Respiratory:  Positive for chest tightness. Negative for shortness of breath.   Cardiovascular: Negative.   Gastrointestinal: Negative.   Genitourinary: Negative.   Skin: Negative.   Neurological:  Positive for headaches. Negative for dizziness.     Objective:    Physical Exam Vitals and nursing note reviewed.  Constitutional:      General: She is not in acute distress.    Appearance: Normal appearance.  HENT:     Head: Normocephalic and atraumatic.  Eyes:     Conjunctiva/sclera: Conjunctivae normal.  Cardiovascular:     Rate and Rhythm: Normal rate and regular rhythm.     Pulses: Normal pulses.     Heart sounds: Normal heart sounds.  Pulmonary:     Effort: Pulmonary effort is normal.     Breath sounds: Normal breath sounds.  Musculoskeletal:     Cervical back: Normal range of motion.  Skin:    General: Skin is warm and dry.  Neurological:     General: No focal deficit present.     Mental Status: She is alert and oriented to person, place, and time.  Psychiatric:        Mood and Affect: Mood normal.        Behavior: Behavior normal.        Thought Content: Thought content normal.        Judgment: Judgment normal.    BP 140/70 (BP Location: Left Arm, Patient Position: Sitting)   Pulse 88   Temp 98.6 F (37 C) (Oral)   Ht 4' 11.88" (1.521 m)   Wt (!) 338 lb 12.8 oz (153.7 kg)   SpO2 99%   BMI 66.43 kg/m  Wt Readings from Last 3 Encounters:  07/23/21 (!) 338 lb 12.8 oz (153.7 kg)  07/16/21 (!) 343 lb 3.2 oz (155.7 kg)  06/24/21 (!) 347 lb 9.6 oz (157.7 kg)     Health Maintenance Due  Topic Date Due   INFLUENZA VACCINE  07/13/2021    There are no preventive care reminders to display for this patient.  Lab Results  Component Value Date   TSH 2.150 07/16/2021   Lab Results  Component Value Date   WBC 10.2 07/16/2021   HGB 14.1 07/16/2021   HCT 43.5 07/16/2021   MCV 88 07/16/2021   PLT 196 07/16/2021   Lab Results   Component Value Date   NA 141 07/16/2021   K 3.7 07/16/2021   CO2 28  07/16/2021   GLUCOSE 157 (H) 07/16/2021   BUN 9 07/16/2021   CREATININE 0.65 07/16/2021   BILITOT 0.7 07/16/2021   ALKPHOS 98 07/16/2021   AST 34 07/16/2021   ALT 14 07/16/2021   PROT 7.3 07/16/2021   ALBUMIN 3.9 07/16/2021   CALCIUM 9.4 07/16/2021   EGFR 111 07/16/2021   Lab Results  Component Value Date   CHOL 170 06/24/2021   Lab Results  Component Value Date   HDL 44 06/24/2021   Lab Results  Component Value Date   LDLCALC 111 (H) 06/24/2021   Lab Results  Component Value Date   TRIG 77 06/24/2021   Lab Results  Component Value Date   CHOLHDL 3.5 09/05/2017   Lab Results  Component Value Date   HGBA1C 6.3 06/24/2021      Assessment & Plan:   Problem List Items Addressed This Visit       Cardiovascular and Mediastinum   Hypertension associated with diabetes ([place]) - Primary    BP today 140/70. Continue losartan 14m daily as she has only been taking this for 6 days. Continue checking blood pressure at home twice a day or if she has concern for it being elevated. Discussed low salt diet and exercise. Labs reviewed from ER yesterday. Follow up in 3 weeks.        No orders of the defined types were placed in this encounter.   Follow-up: Return in about 3 weeks (around 08/13/2021) for htn.    LCharyl Dancer NP

## 2021-07-23 NOTE — Assessment & Plan Note (Signed)
BP today 140/70. Continue losartan '25mg'$  daily as she has only been taking this for 6 days. Continue checking blood pressure at home twice a day or if she has concern for it being elevated. Discussed low salt diet and exercise. Labs reviewed from ER yesterday. Follow up in 3 weeks.

## 2021-07-29 ENCOUNTER — Ambulatory Visit: Payer: Managed Care, Other (non HMO) | Admitting: Neurology

## 2021-07-29 ENCOUNTER — Encounter: Payer: Self-pay | Admitting: Neurology

## 2021-07-29 VITALS — BP 121/74 | HR 85 | Ht 62.0 in | Wt 342.4 lb

## 2021-07-29 DIAGNOSIS — R519 Headache, unspecified: Secondary | ICD-10-CM | POA: Diagnosis not present

## 2021-07-29 DIAGNOSIS — R351 Nocturia: Secondary | ICD-10-CM

## 2021-07-29 DIAGNOSIS — R5383 Other fatigue: Secondary | ICD-10-CM

## 2021-07-29 DIAGNOSIS — Z6841 Body Mass Index (BMI) 40.0 and over, adult: Secondary | ICD-10-CM

## 2021-07-29 DIAGNOSIS — Z9189 Other specified personal risk factors, not elsewhere classified: Secondary | ICD-10-CM | POA: Diagnosis not present

## 2021-07-29 DIAGNOSIS — R0683 Snoring: Secondary | ICD-10-CM | POA: Diagnosis not present

## 2021-07-29 NOTE — Progress Notes (Signed)
Subjective:    Patient ID: Amy [name] is a 45 y.o. female.  HPI    Star Age, MD, PhD [facility] [address], [address] P.O. [place], [place] [address]   Dear Henrine Screws,   I saw your patient, Amy [name], your kind request in my sleep clinic today for initial consultation of her sleep disorder, in particular, concern for underlying obstructive sleep apnea. The patient is unaccompanied today.  As you know, Amy [name] is a 45 year old right-handed woman with an underlying medical history of diabetes, hypothyroidism, allergies, psoriasis, hypertension, hyperlipidemia, and morbid obesity with a BMI of over 60, who reports snoring and excessive daytime somnolence, waking up feeling tired and with recurrent morning headaches.  Her headaches are typically in the back and radiate forward, this is a dull, achy headache and typically abates within the hour, she typically does not take any medication for this.  She has had migraines before and these headaches are not migraine-[name] to her.  She has no family history of sleep apnea.  Her father died young at 77 from liver cirrhosis, mom is 2 years old.  Patient lives with her husband and 2 children, son is 66 and daughter is 89.  They have outside dog.  She does have a TV in the bedroom which is on her husband's preference but he goes to bed earlier than she does and she turns the TV off when she is ready to go to sleep.  She is generally in bed around 11 or before.  She has a rise time of 6:30 AM.  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  She quit smoking some 16 years ago.  She drinks alcohol rarely, she drinks caffeine in the form of coffee, generally 1 cup of coffee in the mornings, has eliminated soda and tea.  I reviewed your office note from 06/24/2021.  Her Epworth sleepiness score is 1 out of 24, fatigue severity score is 41 out of 63.  She has nocturia about once per average night.  Her  Past Medical History Is Significant For: Past Medical History:  Diagnosis Date   Allergy    Asthma    Diabetes mellitus without complication ([place])    Obesity    Psoriasis     Her Past Surgical History Is Significant For: Past Surgical History:  Procedure Laterality Date   CESAREAN SECTION     X 2   HAND SURGERY Right    THUMB   HERNIA REPAIR     right thumb surgery      Her Family History Is Significant For: Family History  Problem Relation Age of Onset   Hypertension Mother    Thyroid disease Mother    Autoimmune disease Mother    Anemia Mother    Cirrhosis Father    Glaucoma Father    Thyroid disease Maternal Grandmother    Osteoporosis Maternal Grandmother    Hypertension Maternal Grandmother    Diabetes Maternal Grandmother    Congestive Heart Failure Paternal Grandmother    Appendicitis Paternal Grandfather    Asthma Daughter    Allergies Daughter    ADD / ADHD Son    Sleep apnea Neg Hx     Her Social History Is Significant For: Social History   Socioeconomic History   Marital status: Married    Spouse name: Not on file   Number of children: Not on file   Years of education: Not on file  Highest education level: Not on file  Occupational History   Not on file  Tobacco Use   Smoking status: Former    Packs/day: 0.50    Years: 5.00    Pack years: 2.50    Types: Cigarettes    Quit date: 01/04/2005    Years since quitting: 16.5   Smokeless tobacco: Never  Vaping Use   Vaping Use: Never used  Substance and Sexual Activity   Alcohol use: No    Alcohol/week: 0.0 standard drinks   Drug use: No   Sexual activity: Not on file  Other Topics Concern   Not on file  Social History Narrative   Not on file   Social Determinants of Health   Financial Resource Strain: Not on file  Food Insecurity: Not on file  Transportation Needs: Not on file  Physical Activity: Not on file  Stress: Not on file  Social Connections: Not on file    Her Allergies  Are:  Allergies  Allergen Reactions   Biaxin [Clarithromycin] Nausea And Vomiting  :   Her Current Medications Are:  Outpatient Encounter Medications as of 07/29/2021  Medication Sig   Blood Glucose Monitoring Suppl (ONETOUCH VERIO) w/Device KIT Use to check blood sugar once a day, first thing in morning fasting, with goal blood sugar < 130.   clobetasol ointment (TEMOVATE) 7.29 % Apply 1 application topically 2 (two) times daily.   glucose blood (ONETOUCH VERIO) test strip Use to check blood sugar once daily, fasting.   Lancets (ONETOUCH ULTRASOFT) lancets Use to check blood sugar once daily, fasting.   levocetirizine (XYZAL) 5 MG tablet Take 1 tablet (5 mg total) by mouth daily.   levothyroxine (SYNTHROID) 50 MCG tablet Take 1 tablet (50 mcg total) by mouth daily.   losartan (COZAAR) 25 MG tablet Take 1 tablet (25 mg total) by mouth daily.   metFORMIN (GLUCOPHAGE) 500 MG tablet Start out taking 1 tablet (500 MG) by mouth daily at bedtime.   Multiple Vitamin (MULTIVITAMINS PO) Take 1 tablet by mouth daily.    rosuvastatin (CRESTOR) 10 MG tablet Take 1 tablet (10 mg total) by mouth daily.   Vitamin D, Ergocalciferol, (DRISDOL) 1.25 MG (50000 UNIT) CAPS capsule Take 1 capsule by mouth once a week   No facility-administered encounter medications on file as of 07/29/2021.  :   Review of Systems:  Out of a complete 14 point review of systems, all are reviewed and negative with the exception of these symptoms as listed below:   Review of Systems  Neurological:        Pt is here for sleep study consult . Pt states when she wakes up In am she is tired and pt husband has heard her snore . Pt states she is not able to sleep for long periods of times. Pt gets about 8 hours of sleep. Pt states when she gets up in am she has headaches.   Epworth Sleepiness Scale 0= would never doze 1= slight chance of dozing 2= moderate chance of dozing 3= high chance of dozing  Sitting and  reading:0 Watching TV:0 Sitting inactive in a public place (ex. Theater or meeting):0 As a passenger in a car for an hour without a break:1 Lying down to rest in the afternoon:0 Sitting and talking to someone:0 Sitting quietly after lunch (no alcohol):0 In a car, while stopped in traffic:0 Total :1   Objective:  Neurological Exam  Physical Exam Physical Examination:   Vitals:   07/29/21 1236  BP: 121/74  Pulse: 85   General Examination: The patient is a very pleasant 46 y.o. female in no acute distress. She appears well-developed and well-nourished and well groomed.   HEENT: Normocephalic, atraumatic, pupils are equal, round and reactive to light, extraocular tracking is good without limitation to gaze excursion or nystagmus noted. Hearing is grossly intact. Face is symmetric with normal facial animation. Speech is clear with no dysarthria noted. There is no hypophonia. There is no lip, neck/head, jaw or voice tremor. Neck is supple with full range of passive and active motion. There are no carotid bruits on auscultation. Oropharynx exam reveals: mild mouth dryness, good dental hygiene and mild airway crowding, due to small airway entry, slightly elongated uvula, Mallampati class III.  Tonsils about 1+ bilaterally.  Tongue protrudes centrally and palate elevates symmetrically.  Nasal inspection reveals small/narrow nasal passages and septal deviation to the left.  Neck circumference of 17-1/4 inches.  She has a mild overbite.  Chest: Clear to auscultation without wheezing, rhonchi or crackles noted.  Heart: S1+S2+0, regular and normal without murmurs, rubs or gallops noted.   Abdomen: Soft, non-tender and non-distended.  Extremities: There is no pitting edema in the distal lower extremities bilaterally.   Skin: Warm and dry without trophic changes noted.   Musculoskeletal: exam reveals no obvious joint deformities, tenderness or joint swelling or erythema.   Neurologically:   Mental status: The patient is awake, alert and oriented in all 4 spheres. Her immediate and remote memory, attention, language skills and fund of knowledge are appropriate. There is no evidence of aphasia, agnosia, apraxia or anomia. Speech is clear with normal prosody and enunciation. Thought process is linear. Mood is normal and affect is normal.  Cranial nerves II - XII are as described above under HEENT exam.  Motor exam: Normal bulk, strength and tone is noted. There is no tremor. Fine motor skills and coordination: grossly intact.  Cerebellar testing: No dysmetria or intention tremor. There is no truncal or gait ataxia.  Sensory exam: intact to light touch in the upper and lower extremities.  Gait, station and balance: She stands easily. No veering to one side is noted. No leaning to one side is noted. Posture is age-appropriate and stance is narrow based. Gait shows normal stride length and normal pace. No problems turning are noted.   Assessment and Plan:  In summary, Amy [name] is a very pleasant 45 y.o.-year old female with an underlying medical history of diabetes, hypothyroidism, allergies, psoriasis, hypertension, hyperlipidemia, and morbid obesity with a BMI of over 60, whose history and physical exam are concerning for obstructive sleep apnea (OSA). I had a long chat with the patient about my findings and the diagnosis of OSA, its prognosis and treatment options. We talked about medical treatments, surgical interventions and non-pharmacological approaches. I explained in particular the risks and ramifications of untreated moderate to severe OSA, especially with respect to developing cardiovascular disease down the Road, including congestive heart failure, difficult to treat hypertension, cardiac arrhythmias, or stroke. Even type 2 diabetes has, in part, been linked to untreated OSA. Symptoms of untreated OSA include daytime sleepiness, memory problems, mood irritability and mood  disorder such as depression and anxiety, lack of energy, as well as recurrent headaches, especially morning headaches. We talked about trying to maintain a healthy lifestyle in general, as well as the importance of weight control. We also talked about the importance of good sleep hygiene. I recommended the following at this time: sleep study.  I explained the difference between a laboratory attended sleep study versus home sleep test. I outlined possible surgical and non-surgical treatment options of OSA, including the use of a custom-made dental device (which would require a referral to a specialist dentist or oral surgeon), upper airway surgical options, such as traditional UPPP or a novel less invasive surgical option in the form of Inspire hypoglossal nerve stimulation (which would involve a referral to an ENT surgeon). I also explained the CPAP treatment option to the patient, who indicated that she would be willing to try CPAP if the need arises.  I answered all her questions today and the patient was in agreement. I plan to see her back after the sleep study is completed and encouraged her to call with any interim questions, concerns, problems or updates.   Thank you very much for allowing me to participate in the care of this nice patient. If I can be of any further assistance to you please do not hesitate to call me at [phone].  Sincerely,   Star Age, MD, PhD

## 2021-07-29 NOTE — Patient Instructions (Signed)

## 2021-08-10 ENCOUNTER — Telehealth: Payer: Self-pay

## 2021-08-10 NOTE — Telephone Encounter (Signed)
LVM for pt to call me back to schedule sleep study  

## 2021-08-14 ENCOUNTER — Ambulatory Visit: Payer: Managed Care, Other (non HMO) | Admitting: Nurse Practitioner

## 2021-08-18 ENCOUNTER — Telehealth: Payer: Self-pay | Admitting: Neurology

## 2021-08-18 NOTE — Telephone Encounter (Signed)
LVM for pt to call me back to schedule sleep study  

## 2021-08-20 ENCOUNTER — Other Ambulatory Visit: Payer: Self-pay

## 2021-08-20 ENCOUNTER — Ambulatory Visit: Payer: Managed Care, Other (non HMO) | Admitting: Nurse Practitioner

## 2021-08-20 ENCOUNTER — Encounter: Payer: Self-pay | Admitting: Nurse Practitioner

## 2021-08-20 VITALS — BP 128/83 | HR 91 | Temp 98.8°F | Wt 338.6 lb

## 2021-08-20 DIAGNOSIS — E1159 Type 2 diabetes mellitus with other circulatory complications: Secondary | ICD-10-CM

## 2021-08-20 DIAGNOSIS — I152 Hypertension secondary to endocrine disorders: Secondary | ICD-10-CM | POA: Diagnosis not present

## 2021-08-20 MED ORDER — LOSARTAN POTASSIUM 50 MG PO TABS: 50.0000 mg | ORAL_TABLET | Freq: Every day | ORAL | 4 refills | Status: DC

## 2021-08-20 MED ORDER — AMLODIPINE BESYLATE 2.5 MG PO TABS: 2.5000 mg | ORAL_TABLET | Freq: Every day | ORAL | 4 refills | Status: DC

## 2021-08-20 NOTE — Assessment & Plan Note (Signed)
BMI 61.93.  Recommended eating smaller high protein, low fat meals more frequently and exercising 30 mins a day 5 times a week with a goal of 10-15lb weight loss in the next 3 months. Patient voiced their understanding and motivation to adhere to these recommendations.

## 2021-08-20 NOTE — Progress Notes (Signed)
BP 128/83   Pulse 91   Temp 98.8 F (37.1 C) (Oral)   Wt (!) 338 lb 9.6 oz (153.6 kg)   SpO2 95%   BMI 61.93 kg/m    Subjective:    Patient ID: Amy [name], female    DOB: [birth year], 45 y.o.   MRN: [id number]  HPI: Amy [name] is a 45 y.o. female  Chief Complaint  Patient presents with   Hypertension    Patient is here to follow up on Hypertension. Patient states her blood pressure readings have still been elevated. Patient states the top number has been ranging upper 150s and the bottom number is between 95-101. Patient states when it is elevated her face feels flushed, headaches and nervous to drive.    HYPERTENSION Currently taking Losartan 25 MG daily.  Has noticed elevations at home in BP in morning and sometimes and 1-2 hours after taking medications.  She does notice when BP high -- gets nervous, flushed, headaches.  Hypertension status: stable  Satisfied with current treatment? yes Duration of hypertension: chronic BP monitoring frequency:  daily BP range: 150/90-100 BP medication side effects:  no Medication compliance: good compliance Aspirin: no Recurrent headaches:  occasional Visual changes: no Palpitations: no Dyspnea: no Chest pain: no Lower extremity edema: no Dizzy/lightheaded: no   Relevant past medical, surgical, family and social history reviewed and updated as indicated. Interim medical history since our last visit reviewed. Allergies and medications reviewed and updated.  Review of Systems  Constitutional: Negative.   Respiratory: Negative.    Cardiovascular: Negative.   Gastrointestinal: Negative.   Neurological: Negative.   Psychiatric/Behavioral: Negative.     Per HPI unless specifically indicated above     Objective:    BP 128/83   Pulse 91   Temp 98.8 F (37.1 C) (Oral)   Wt (!) 338 lb 9.6 oz (153.6 kg)   SpO2 95%   BMI 61.93 kg/m   Wt Readings from Last 3 Encounters:  08/20/21 (!) 338 lb 9.6 oz (153.6 kg)   07/29/21 (!) 342 lb 6.4 oz (155.3 kg)  07/23/21 (!) 338 lb 12.8 oz (153.7 kg)    Physical Exam Vitals and nursing note reviewed.  Constitutional:      General: She is awake. She is not in acute distress.    Appearance: She is well-developed and well-groomed. She is morbidly obese. She is not ill-appearing.  HENT:     Head: Normocephalic.     Right Ear: Hearing normal.     Left Ear: Hearing normal.  Eyes:     General: Lids are normal.        Right eye: No discharge.        Left eye: No discharge.     Conjunctiva/sclera: Conjunctivae normal.     Pupils: Pupils are equal, round, and reactive to light.  Neck:     Thyroid: No thyromegaly.     Vascular: No carotid bruit.  Cardiovascular:     Rate and Rhythm: Normal rate and regular rhythm.     Heart sounds: Normal heart sounds. No murmur heard.   No gallop.  Pulmonary:     Effort: Pulmonary effort is normal. No accessory muscle usage or respiratory distress.     Breath sounds: Normal breath sounds.  Abdominal:     General: Bowel sounds are normal.     Palpations: Abdomen is soft.  Musculoskeletal:     Cervical back: Normal range of motion and neck supple.  Right lower leg: No edema.     Left lower leg: No edema.  Skin:    General: Skin is warm and dry.  Neurological:     Mental Status: She is alert and oriented to person, place, and time.  Psychiatric:        Attention and Perception: Attention normal.        Mood and Affect: Mood normal.        Speech: Speech normal.        Behavior: Behavior normal. Behavior is cooperative.        Thought Content: Thought content normal.    Results for orders placed or performed in visit on 07/16/21  Comp Met (CMET)  Result Value Ref Range   Glucose 157 (H) 65 - 99 mg/dL   BUN 9 6 - 24 mg/dL   Creatinine, Ser 0.65 0.57 - 1.00 mg/dL   eGFR 111 >59 mL/min/1.73   BUN/Creatinine Ratio 14 9 - 23   Sodium 141 134 - 144 mmol/L   Potassium 3.7 3.5 - 5.2 mmol/L   Chloride 100 96 - 106  mmol/L   CO2 28 20 - 29 mmol/L   Calcium 9.4 8.7 - 10.2 mg/dL   Total Protein 7.3 6.0 - 8.5 g/dL   Albumin 3.9 3.8 - 4.8 g/dL   Globulin, Total 3.4 1.5 - 4.5 g/dL   Albumin/Globulin Ratio 1.1 (L) 1.2 - 2.2   Bilirubin Total 0.7 0.0 - 1.2 mg/dL   Alkaline Phosphatase 98 44 - 121 IU/L   AST 34 0 - 40 IU/L   ALT 14 0 - 32 IU/L  CBC with Differential  Result Value Ref Range   WBC 10.2 3.4 - 10.8 x10E3/uL   RBC 4.96 3.77 - 5.28 x10E6/uL   Hemoglobin 14.1 11.1 - 15.9 g/dL   Hematocrit 43.5 34.0 - 46.6 %   MCV 88 79 - 97 fL   MCH 28.4 26.6 - 33.0 pg   MCHC 32.4 31.5 - 35.7 g/dL   RDW 13.3 11.7 - 15.4 %   Platelets 196 150 - 450 x10E3/uL   Neutrophils 66 Not Estab. %   Lymphs 25 Not Estab. %   Monocytes 7 Not Estab. %   Eos 1 Not Estab. %   Basos 1 Not Estab. %   Neutrophils Absolute 6.7 1.4 - 7.0 x10E3/uL   Lymphocytes Absolute 2.5 0.7 - 3.1 x10E3/uL   Monocytes Absolute 0.8 0.1 - 0.9 x10E3/uL   EOS (ABSOLUTE) 0.1 0.0 - 0.4 x10E3/uL   Basophils Absolute 0.1 0.0 - 0.2 x10E3/uL   Immature Granulocytes 0 Not Estab. %   Immature Grans (Abs) 0.0 0.0 - 0.1 x10E3/uL  Magnesium  Result Value Ref Range   Magnesium 2.1 1.6 - 2.3 mg/dL  Phosphorus  Result Value Ref Range   Phosphorus 3.1 3.0 - 4.3 mg/dL  TSH  Result Value Ref Range   TSH 2.150 0.450 - 4.500 uIU/mL      Assessment & Plan:   Problem List Items Addressed This Visit       Cardiovascular and Mediastinum   Hypertension associated with diabetes ([place]) - Primary    Chronic, ongoing with ongoing elevations at home on occasion, although does endorse stressors with her 84 yo son too.  BP stable in office today, however with elevations ongoing at home will increase Losartan to 50 MG daily and add on Amlodipine 2.5 MG to take at night. Scripts sent.  Recommend she monitor BP at least a few mornings a week at  home and document.  DASH diet at home. BMP today.  Return in October as scheduled.       Relevant Medications    losartan (COZAAR) 50 MG tablet   amLODipine (NORVASC) 2.5 MG tablet   Other Relevant Orders   Basic metabolic panel     Other   Morbid obesity ([facility])    BMI 61.93.  Recommended eating smaller high protein, low fat meals more frequently and exercising 30 mins a day 5 times a week with a goal of 10-15lb weight loss in the next 3 months. Patient voiced their understanding and motivation to adhere to these recommendations.         Follow up plan: Return for as scheduled October.

## 2021-08-20 NOTE — Assessment & Plan Note (Signed)
Chronic, ongoing with ongoing elevations at home on occasion, although does endorse stressors with her 45 yo son too.  BP stable in office today, however with elevations ongoing at home will increase Losartan to 50 MG daily and add on Amlodipine 2.5 MG to take at night. Scripts sent.  Recommend she monitor BP at least a few mornings a week at home and document.  DASH diet at home. BMP today.  Return in October as scheduled.

## 2021-08-20 NOTE — Patient Instructions (Signed)
Diabetes Mellitus and Nutrition, Adult When you have diabetes, or diabetes mellitus, it is very important to have healthy eating habits because your blood sugar (glucose) levels are greatly affected by what you eat and drink. Eating healthy foods in the right amounts, at about the same times every day, can help you:  Control your blood glucose.  Lower your risk of heart disease.  Improve your blood pressure.  Reach or maintain a healthy weight. What can affect my meal plan? Every person with diabetes is different, and each person has different needs for a meal plan. Your health care provider may recommend that you work with a dietitian to make a meal plan that is best for you. Your meal plan may vary depending on factors such as:  The calories you need.  The medicines you take.  Your weight.  Your blood glucose, blood pressure, and cholesterol levels.  Your activity level.  Other health conditions you have, such as heart or kidney disease. How do carbohydrates affect me? Carbohydrates, also called carbs, affect your blood glucose level more than any other type of food. Eating carbs naturally raises the amount of glucose in your blood. Carb counting is a method for keeping track of how many carbs you eat. Counting carbs is important to keep your blood glucose at a healthy level, especially if you use insulin or take certain oral diabetes medicines. It is important to know how many carbs you can safely have in each meal. This is different for every person. Your dietitian can help you calculate how many carbs you should have at each meal and for each snack. How does alcohol affect me? Alcohol can cause a sudden decrease in blood glucose (hypoglycemia), especially if you use insulin or take certain oral diabetes medicines. Hypoglycemia can be a life-threatening condition. Symptoms of hypoglycemia, such as sleepiness, dizziness, and confusion, are similar to symptoms of having too much  alcohol.  Do not drink alcohol if: ? Your health care provider tells you not to drink. ? You are pregnant, may be pregnant, or are planning to become pregnant.  If you drink alcohol: ? Do not drink on an empty stomach. ? Limit how much you use to:  0-1 drink a day for women.  0-2 drinks a day for men. ? Be aware of how much alcohol is in your drink. In the [place] one drink equals one 12 oz bottle of beer (355 mL), one 5 oz glass of wine (148 mL), or one 1 oz glass of hard liquor (44 mL). ? Keep yourself hydrated with water, diet soda, or unsweetened iced tea.  Keep in mind that regular soda, juice, and other mixers may contain a lot of sugar and must be counted as carbs. What are tips for following this plan? Reading food labels  Start by checking the serving size on the "Nutrition Facts" label of packaged foods and drinks. The amount of calories, carbs, fats, and other nutrients listed on the label is based on one serving of the item. Many items contain more than one serving per package.  Check the total grams (g) of carbs in one serving. You can calculate the number of servings of carbs in one serving by dividing the total carbs by 15. For example, if a food has 30 g of total carbs per serving, it would be equal to 2 servings of carbs.  Check the number of grams (g) of saturated fats and trans fats in one serving. Choose foods that have   a low amount or none of these fats.  Check the number of milligrams (mg) of salt (sodium) in one serving. Most people should limit total sodium intake to less than 2,300 mg per day.  Always check the nutrition information of foods labeled as "low-fat" or "nonfat." These foods may be higher in added sugar or refined carbs and should be avoided.  Talk to your dietitian to identify your daily goals for nutrients listed on the label. Shopping  Avoid buying canned, pre-made, or processed foods. These foods tend to be high in fat, sodium, and added  sugar.  Shop around the outside edge of the grocery store. This is where you will most often find fresh fruits and vegetables, bulk grains, fresh meats, and fresh dairy. Cooking  Use low-heat cooking methods, such as baking, instead of high-heat cooking methods like deep frying.  Cook using healthy oils, such as olive, canola, or sunflower oil.  Avoid cooking with butter, cream, or high-fat meats. Meal planning  Eat meals and snacks regularly, preferably at the same times every day. Avoid going long periods of time without eating.  Eat foods that are high in fiber, such as fresh fruits, vegetables, beans, and whole grains. Talk with your dietitian about how many servings of carbs you can eat at each meal.  Eat 4-6 oz (112-168 g) of lean protein each day, such as lean meat, chicken, fish, eggs, or tofu. One ounce (oz) of lean protein is equal to: ? 1 oz (28 g) of meat, chicken, or fish. ? 1 egg. ?  cup (62 g) of tofu.  Eat some foods each day that contain healthy fats, such as avocado, nuts, seeds, and fish.   What foods should I eat? Fruits Berries. Apples. Oranges. Peaches. Apricots. Plums. Grapes. Mango. Papaya. Pomegranate. Kiwi. Cherries. Vegetables Lettuce. Spinach. Leafy greens, including kale, chard, collard greens, and mustard greens. Beets. Cauliflower. Cabbage. Broccoli. Carrots. Green beans. Tomatoes. Peppers. Onions. Cucumbers. Brussels sprouts. Grains Whole grains, such as whole-wheat or whole-grain bread, crackers, tortillas, cereal, and pasta. Unsweetened oatmeal. Quinoa. Brown or wild rice. Meats and other proteins Seafood. Poultry without skin. Lean cuts of poultry and beef. Tofu. Nuts. Seeds. Dairy Low-fat or fat-free dairy products such as milk, yogurt, and cheese. The items listed above may not be a complete list of foods and beverages you can eat. Contact a dietitian for more information. What foods should I avoid? Fruits Fruits canned with  syrup. Vegetables Canned vegetables. Frozen vegetables with butter or cream sauce. Grains Refined white flour and flour products such as bread, pasta, snack foods, and cereals. Avoid all processed foods. Meats and other proteins Fatty cuts of meat. Poultry with skin. Breaded or fried meats. Processed meat. Avoid saturated fats. Dairy Full-fat yogurt, cheese, or milk. Beverages Sweetened drinks, such as soda or iced tea. The items listed above may not be a complete list of foods and beverages you should avoid. Contact a dietitian for more information. Questions to ask a health care provider  Do I need to meet with a diabetes educator?  Do I need to meet with a dietitian?  What number can I call if I have questions?  When are the best times to check my blood glucose? Where to find more information:  American Diabetes Association: diabetes.org  Academy of Nutrition and Dietetics: www.eatright.org  National Institute of Diabetes and Digestive and Kidney Diseases: www.niddk.nih.gov  Association of Diabetes Care and Education Specialists: www.diabeteseducator.org Summary  It is important to have healthy eating   habits because your blood sugar (glucose) levels are greatly affected by what you eat and drink.  A healthy meal plan will help you control your blood glucose and maintain a healthy lifestyle.  Your health care provider may recommend that you work with a dietitian to make a meal plan that is best for you.  Keep in mind that carbohydrates (carbs) and alcohol have immediate effects on your blood glucose levels. It is important to count carbs and to use alcohol carefully. This information is not intended to replace advice given to you by your health care provider. Make sure you discuss any questions you have with your health care provider. Document Revised: 11/06/2019 Document Reviewed: 11/06/2019 Elsevier Patient Education  2021 Elsevier Inc.  

## 2021-08-25 ENCOUNTER — Telehealth: Payer: Self-pay

## 2021-08-25 NOTE — Telephone Encounter (Signed)
We have attempted to call the patient 2 times to schedule sleep study. Patient has been unavailable at the phone numbers we have on file and has not returned our calls. If patient calls back we will schedule them for their sleep study. ° °

## 2021-09-08 LAB — BASIC METABOLIC PANEL

## 2021-09-10 ENCOUNTER — Ambulatory Visit: Payer: Managed Care, Other (non HMO) | Admitting: Nurse Practitioner

## 2021-09-10 ENCOUNTER — Other Ambulatory Visit: Payer: Self-pay

## 2021-09-10 ENCOUNTER — Encounter: Payer: Self-pay | Admitting: Nurse Practitioner

## 2021-09-10 ENCOUNTER — Institutional Professional Consult (permissible substitution): Payer: Managed Care, Other (non HMO) | Admitting: Neurology

## 2021-09-10 VITALS — BP 120/79 | HR 82 | Temp 98.3°F | Wt 329.0 lb

## 2021-09-10 DIAGNOSIS — I152 Hypertension secondary to endocrine disorders: Secondary | ICD-10-CM | POA: Diagnosis not present

## 2021-09-10 DIAGNOSIS — N951 Menopausal and female climacteric states: Secondary | ICD-10-CM | POA: Diagnosis not present

## 2021-09-10 DIAGNOSIS — E1159 Type 2 diabetes mellitus with other circulatory complications: Secondary | ICD-10-CM

## 2021-09-10 NOTE — Assessment & Plan Note (Signed)
Struggling with some perimenopause symptoms including mood changes and hot flashes.  Recommend light clothing at home and plenty of fluid.  She wishes to try OTC supplements and meditation for mood, if in future needs medication to assist she will alert PCP.

## 2021-09-10 NOTE — Patient Instructions (Signed)
Perimenopause ?Perimenopause is the normal time of a woman's life when the levels of estrogen, the female hormone produced by the ovaries, begin to decrease. This leads to changes in menstrual periods before they stop completely (menopause). Perimenopause can begin 2-8 years before menopause. During perimenopause, the ovaries may or may not produce an egg and a woman can still become pregnant. ?What are the causes? ?This condition is caused by a natural change in hormone levels that happens as you get older. ?What increases the risk? ?This condition is more likely to start at an earlier age if you have certain medical conditions or have undergone treatments, including: ?A tumor of the pituitary gland in the brain. ?A disease that affects the ovaries and hormone production. ?Certain cancer treatments, such as chemotherapy or hormone therapy, or radiation therapy on the pelvis. ?Heavy smoking and excessive alcohol use. ?Family history of early menopause. ?What are the signs or symptoms? ?Perimenopausal changes affect each woman differently. Symptoms of this condition may include: ?Hot flashes. ?Irregular menstrual periods. ?Night sweats. ?Changes in feelings about sex. This could be a decrease in sex drive or an increased discomfort around your sexuality. ?Vaginal dryness. ?Headaches. ?Mood swings. ?Depression. ?Problems sleeping (insomnia). ?Memory problems or trouble concentrating. ?Irritability. ?Tiredness. ?Weight gain. ?Anxiety. ?Trouble getting pregnant. ?How is this diagnosed? ?This condition is diagnosed based on your medical history, a physical exam, your age, your menstrual history, and your symptoms. Hormone tests may also be done. ?How is this treated? ?In some cases, no treatment is needed. You and your health care provider should make a decision together about whether treatment is necessary. Treatment will be based on your individual condition and preferences. Various treatments are available, such  as: ?Menopausal hormone therapy (MHT). ?Medicines to treat specific symptoms. ?Acupuncture. ?Vitamin or herbal supplements. ?Before starting treatment, make sure to let your health care provider know if you have a personal or family history of: ?Heart disease. ?Breast cancer. ?Blood clots. ?Diabetes. ?Osteoporosis. ?Follow these instructions at home: ?Medicines ?Take over-the-counter and prescription medicines only as told by your health care provider. ?Take vitamin supplements only as told by your health care provider. ?Talk with your health care provider before starting any herbal supplements. ?Lifestyle ? ?Do not use any products that contain nicotine or tobacco, such as cigarettes, e-cigarettes, and chewing tobacco. If you need help quitting, ask your health care provider. ?Get at least 30 minutes of physical activity on 5 or more days each week. ?Eat a balanced diet that includes fresh fruits and vegetables, whole grains, soybeans, eggs, lean meat, and low-fat dairy. ?Avoid alcoholic and caffeinated beverages, as well as spicy foods. This may help prevent hot flashes. ?Get 7-8 hours of sleep each night. ?Dress in layers that can be removed to help you manage hot flashes. ?Find ways to manage stress, such as deep breathing, meditation, or journaling. ?General instructions ? ?Keep track of your menstrual periods, including: ?When they occur. ?How heavy they are and how long they last. ?How much time passes between periods. ?Keep track of your symptoms, noting when they start, how often you have them, and how long they last. ?Use vaginal lubricants or moisturizers to help with vaginal dryness and improve comfort during sex. ?You can still become pregnant if you are having irregular periods. Make sure you use contraception during perimenopause if you do not want to get pregnant. ?Keep all follow-up visits. This is important. This includes any group therapy or counseling. ?Contact a health care provider if: ?You  have   heavy vaginal bleeding or pass blood clots. ?Your period lasts more than 2 days longer than normal. ?Your periods are recurring sooner than 21 days. ?You bleed after having sex. ?You have pain during sex. ?Get help right away if you have: ?Chest pain, trouble breathing, or trouble talking. ?Severe depression. ?Pain when you urinate. ?Severe headaches. ?Vision problems. ?Summary ?Perimenopause is the time when a woman's body begins to move into menopause. This may happen naturally or as a result of other health problems or medical treatments. ?Perimenopause can begin 2-8 years before menopause, and it can last for several years. ?Perimenopausal symptoms can be managed through medicines, lifestyle changes, and complementary therapies such as acupuncture. ?This information is not intended to replace advice given to you by your health care provider. Make sure you discuss any questions you have with your health care provider. ?Document Revised: 05/15/2020 Document Reviewed: 05/15/2020 ?Elsevier Patient Education ? 2022 [employer] ? ?

## 2021-09-10 NOTE — Progress Notes (Signed)
BP 120/79   Pulse 82   Temp 98.3 F (36.8 C) (Oral)   Wt (!) 329 lb (149.2 kg)   SpO2 98%   BMI 60.17 kg/m    Subjective:    Patient ID: Amy [name], female    DOB: July 29, 1976, 45 y.o.   MRN: [id number]  HPI: Amy [name] is a 45 y.o. female  Chief Complaint  Patient presents with   Hypertension    Patient is here for a follow up on Hypertension. Patient states her readings are still up and down. Patient states since the medication she notices her face is flushed, red and hot and she has this real anxious feeling. Patient states she she face is burning up, but the rest of her body is freezing cold and she is about to freeze her family. Patient states she noticed it when she started the medication. Patient states she noticed she is tired and wants to sleep a lot lately.    HYPERTENSION Started 2.5 MG Amlodipine last visit and Losartan increased to 50 MG .  Does endorse anxiety at baseline -- feeling more anxiety these days, especially while driving.  Does endorse some hot flashes -- feeling hot and freezing family out.  Her cycles are irregular at baseline, last cycle was in August.   Hypertension status: stable  Satisfied with current treatment? yes Duration of hypertension: chronic BP monitoring frequency:  daily BP range: 100/54 to 143/79 -- average 110-120/70 BP medication side effects:  no Medication compliance: good compliance Previous BP meds: Amlodipine, Losartan Aspirin: no Recurrent headaches: no Visual changes: no Palpitations: no Dyspnea: no Chest pain: no Lower extremity edema: no Dizzy/lightheaded: no  Depression screen [facility] 2/9 09/10/2021 12/23/2020 11/04/2020 04/28/2020  Decreased Interest 2 0 0 0  Down, Depressed, Hopeless 0 0 0 0  PHQ - 2 Score 2 0 0 0  Altered sleeping 2 0 - 1  Tired, decreased energy 3 0 - 0  Change in appetite 1 0 - 0  Feeling bad or failure about yourself  0 0 - 0  Trouble concentrating 2 0 - 0  Moving slowly or  fidgety/restless 0 0 - 0  Suicidal thoughts 0 0 - 0  PHQ-9 Score 10 0 - 1  Difficult doing work/chores Somewhat difficult - - -    GAD 7 : Generalized Anxiety Score 09/10/2021 04/28/2020  Nervous, Anxious, on Edge 3 0  Control/stop worrying 1 0  Worry too much - different things 0 0  Trouble relaxing 3 0  Restless 2 0  Easily annoyed or irritable 2 0  Afraid - awful might happen 0 0  Total GAD 7 Score 11 0  Anxiety Difficulty Somewhat difficult Not difficult at all    Relevant past medical, surgical, family and social history reviewed and updated as indicated. Interim medical history since our last visit reviewed. Allergies and medications reviewed and updated.  Review of Systems  Constitutional: Negative.   Respiratory: Negative.    Cardiovascular: Negative.   Gastrointestinal: Negative.   Neurological: Negative.   Psychiatric/Behavioral:  Positive for decreased concentration and sleep disturbance. Negative for self-injury and suicidal ideas. The patient is nervous/anxious.    Per HPI unless specifically indicated above     Objective:    BP 120/79   Pulse 82   Temp 98.3 F (36.8 C) (Oral)   Wt (!) 329 lb (149.2 kg)   SpO2 98%   BMI 60.17 kg/m   Wt Readings from Last 3 Encounters:  09/10/21 (!) 329 lb (149.2 kg)  08/20/21 (!) 338 lb 9.6 oz (153.6 kg)  07/29/21 (!) 342 lb 6.4 oz (155.3 kg)    Physical Exam Vitals and nursing note reviewed.  Constitutional:      General: She is awake. She is not in acute distress.    Appearance: She is well-developed and well-groomed. She is morbidly obese. She is not ill-appearing.  HENT:     Head: Normocephalic.     Right Ear: Hearing normal.     Left Ear: Hearing normal.  Eyes:     General: Lids are normal.        Right eye: No discharge.        Left eye: No discharge.     Conjunctiva/sclera: Conjunctivae normal.     Pupils: Pupils are equal, round, and reactive to light.  Neck:     Thyroid: No thyromegaly.     Vascular:  No carotid bruit.  Cardiovascular:     Rate and Rhythm: Normal rate and regular rhythm.     Heart sounds: Normal heart sounds. No murmur heard.   No gallop.  Pulmonary:     Effort: Pulmonary effort is normal. No accessory muscle usage or respiratory distress.     Breath sounds: Normal breath sounds.  Abdominal:     General: Bowel sounds are normal.     Palpations: Abdomen is soft.  Musculoskeletal:     Cervical back: Normal range of motion and neck supple.     Right lower leg: No edema.     Left lower leg: No edema.  Skin:    General: Skin is warm and dry.  Neurological:     Mental Status: She is alert and oriented to person, place, and time.  Psychiatric:        Attention and Perception: Attention normal.        Mood and Affect: Mood normal.        Speech: Speech normal.        Behavior: Behavior normal. Behavior is cooperative.        Thought Content: Thought content normal.    Results for orders placed or performed in visit on 53/29/92  Basic metabolic panel  Result Value Ref Range   Glucose CANCELED mg/dL   BUN CANCELED    Creatinine, Ser CANCELED    Sodium CANCELED    Potassium CANCELED    Chloride CANCELED    CO2 CANCELED    Calcium CANCELED       Assessment & Plan:   Problem List Items Addressed This Visit       Cardiovascular and Mediastinum   Hypertension associated with diabetes ([place]) - Primary    Chronic, ongoing with improvement.  BP stable in office today.  Continue Losartan 50 MG daily and Amlodipine 2.5 MG to take at night. Tolerating changes.  Recommend she monitor BP at least a few mornings a week at home and document.  DASH diet at home. Labs next visit.  Return in October as scheduled.         Other   Morbid obesity ([place])    Losing weight, praised for this.  Recommended eating smaller high protein, low fat meals more frequently and exercising 30 mins a day 5 times a week with a goal of 10-15lb weight loss in the next 3 months. Patient voiced  their understanding and motivation to adhere to these recommendations.       Perimenopause    Struggling with some perimenopause symptoms including  mood changes and hot flashes.  Recommend light clothing at home and plenty of fluid.  She wishes to try OTC supplements and meditation for mood, if in future needs medication to assist she will alert PCP.        Follow up plan: Return for in October as scheduled.

## 2021-09-10 NOTE — Assessment & Plan Note (Signed)
Losing weight, praised for this.  Recommended eating smaller high protein, low fat meals more frequently and exercising 30 mins a day 5 times a week with a goal of 10-15lb weight loss in the next 3 months. Patient voiced their understanding and motivation to adhere to these recommendations.

## 2021-09-10 NOTE — Assessment & Plan Note (Signed)
Chronic, ongoing with improvement.  BP stable in office today.  Continue Losartan 50 MG daily and Amlodipine 2.5 MG to take at night. Tolerating changes.  Recommend she monitor BP at least a few mornings a week at home and document.  DASH diet at home. Labs next visit.  Return in October as scheduled.

## 2021-10-01 ENCOUNTER — Encounter: Payer: Managed Care, Other (non HMO) | Admitting: Nurse Practitioner

## 2021-10-01 NOTE — Progress Notes (Signed)
BP (!) 103/50   Pulse 72   Ht _0  (1.575 m)   Wt (!) 329 lb (149.2 kg)   BMI 60.17 kg/m    Subjective:    Patient ID: Amy [name], female    DOB: 12-24-1975, 45 y.o.   MRN: [id number]  CC: Chief Complaint  Patient presents with   Annual Exam    HPI: Amy [name] is a 45 y.o. female presenting on 10/02/2021 for comprehensive medical examination. Current medical complaints include:none  She currently lives with: husband and 2 teenagers Menopausal Symptoms: yes - intermittent periods  HYPERTENSION / HYPERLIPIDEMIA  Satisfied with current treatment? yes Duration of hypertension: chronic BP monitoring frequency: daily BP range: 90s/60s - 120s/70s BP medication side effects: no Past BP meds: amlodipine and losartan (cozaar) Duration of hyperlipidemia: years Cholesterol medication side effects: no Cholesterol supplements: none Past cholesterol medications: rosuvastatin (crestor) Medication compliance: excellent compliance Aspirin: no Recent stressors: no Recurrent headaches: no Visual changes: no Palpitations: no Dyspnea: no Chest pain: no Lower extremity edema: no Dizzy/lightheaded: no  DIABETES  Hypoglycemic episodes:no Polydipsia/polyuria: no Visual disturbance: no Chest pain: no Paresthesias: no Glucose Monitoring: yes  Accucheck frequency: Not Checking  Fasting glucose: 80s-100s  Post prandial:  Evening:  Before meals: Taking Insulin?: no  Long acting insulin:  Short acting insulin: Blood Pressure Monitoring: daily Retinal Examination: Up to Date Foot Exam: Up to Date Diabetic Education: Completed Pneumovax: Up to Date Influenza: Up to Date Aspirin: no  ANXIETY/STRESS  Duration:uncontrolled Anxious mood: yes  Excessive worrying: no Irritability: yes  Sweating: no Nausea: no Palpitations:no Hyperventilation: no Panic attacks: yes Agoraphobia: no  Obscessions/compulsions: no Depressed mood: no  GAD 7 : Generalized  Anxiety Score 10/02/2021 09/10/2021 04/28/2020  Nervous, Anxious, on Edge 2 3 0  Control/stop worrying 0 1 0  Worry too much - different things 1 0 0  Trouble relaxing 2 3 0  Restless 2 2 0  Easily annoyed or irritable 1 2 0  Afraid - awful might happen 0 0 0  Total GAD 7 Score 8 11 0  Anxiety Difficulty Somewhat difficult Somewhat difficult Not difficult at all   Anhedonia: no Weight changes: no Insomnia: yes hard to stay asleep  Hypersomnia: no Fatigue/loss of energy: yes Feelings of worthlessness: no Feelings of guilt: no Impaired concentration/indecisiveness: yes Suicidal ideations: no  Crying spells: no Recent Stressors/Life Changes: no   Relationship problems: no   Family stress: no     Financial stress: no    Job stress: no    Recent death/loss: no   Depression Screen done today and results listed below:  Depression screen [facility] 2/9 10/02/2021 09/10/2021 12/23/2020 11/04/2020 04/28/2020  Decreased Interest 0 2 0 0 0  Down, Depressed, Hopeless 0 0 0 0 0  PHQ - 2 Score 0 2 0 0 0  Altered sleeping 2 2 0 - 1  Tired, decreased energy 2 3 0 - 0  Change in appetite 1 1 0 - 0  Feeling bad or failure about yourself  0 0 0 - 0  Trouble concentrating 1 2 0 - 0  Moving slowly or fidgety/restless 0 0 0 - 0  Suicidal thoughts 0 0 0 - 0  PHQ-9 Score 6 10 0 - 1  Difficult doing work/chores Somewhat difficult Somewhat difficult - - -    The patient does not have a history of falls. I did not complete a risk assessment for falls. A plan of care for falls was not  documented.   Past Medical History:  Past Medical History:  Diagnosis Date   Allergy    Asthma    Diabetes mellitus without complication ([place])    Obesity    Psoriasis     Surgical History:  Past Surgical History:  Procedure Laterality Date   CESAREAN SECTION     X 2   HAND SURGERY Right    THUMB   HERNIA REPAIR     right thumb surgery      Medications:  Current Outpatient Medications on File Prior to Visit   Medication Sig   amLODipine (NORVASC) 2.5 MG tablet Take 1 tablet (2.5 mg total) by mouth daily.   Blood Glucose Monitoring Suppl (ONETOUCH VERIO) w/Device KIT Use to check blood sugar once a day, first thing in morning fasting, with goal blood sugar < 130.   clobetasol ointment (TEMOVATE) 9.37 % Apply 1 application topically 2 (two) times daily.   glucose blood (ONETOUCH VERIO) test strip Use to check blood sugar once daily, fasting.   Lancets (ONETOUCH ULTRASOFT) lancets Use to check blood sugar once daily, fasting.   losartan (COZAAR) 50 MG tablet Take 1 tablet (50 mg total) by mouth daily.   Multiple Vitamin (MULTIVITAMINS PO) Take 1 tablet by mouth daily.    rosuvastatin (CRESTOR) 10 MG tablet Take 1 tablet (10 mg total) by mouth daily.   Vitamin D, Ergocalciferol, (DRISDOL) 1.25 MG (50000 UNIT) CAPS capsule Take 1 capsule by mouth once a week   No current facility-administered medications on file prior to visit.    Allergies:  Allergies  Allergen Reactions   Biaxin [Clarithromycin] Nausea And Vomiting    Social History:  Social History   Socioeconomic History   Marital status: Married    Spouse name: Not on file   Number of children: Not on file   Years of education: Not on file   Highest education level: Not on file  Occupational History   Not on file  Tobacco Use   Smoking status: Former    Packs/day: 0.50    Years: 5.00    Pack years: 2.50    Types: Cigarettes    Quit date: 01/04/2005    Years since quitting: 16.7   Smokeless tobacco: Never  Vaping Use   Vaping Use: Never used  Substance and Sexual Activity   Alcohol use: No    Alcohol/week: 0.0 standard drinks   Drug use: No   Sexual activity: Not on file  Other Topics Concern   Not on file  Social History Narrative   Not on file   Social Determinants of Health   Financial Resource Strain: Not on file  Food Insecurity: Not on file  Transportation Needs: Not on file  Physical Activity: Not on file   Stress: Not on file  Social Connections: Not on file  Intimate Partner Violence: Not on file   Social History   Tobacco Use  Smoking Status Former   Packs/day: 0.50   Years: 5.00   Pack years: 2.50   Types: Cigarettes   Quit date: 01/04/2005   Years since quitting: 16.7  Smokeless Tobacco Never   Social History   Substance and Sexual Activity  Alcohol Use No   Alcohol/week: 0.0 standard drinks    Family History:  Family History  Problem Relation Age of Onset   Hypertension Mother    Thyroid disease Mother    Autoimmune disease Mother    Anemia Mother    Cirrhosis Father    Glaucoma  Father    Thyroid disease Maternal Grandmother    Osteoporosis Maternal Grandmother    Hypertension Maternal Grandmother    Diabetes Maternal Grandmother    Congestive Heart Failure Paternal Grandmother    Appendicitis Paternal Grandfather    Asthma Daughter    Allergies Daughter    ADD / ADHD Son    Sleep apnea Neg Hx     Past medical history, surgical history, medications, allergies, family history and social history reviewed with patient today and changes made to appropriate areas of the chart.   Review of Systems  Constitutional: Negative.   HENT: Negative.    Eyes: Negative.   Respiratory: Negative.    Cardiovascular: Negative.   Gastrointestinal: Negative.   Genitourinary: Negative.   Musculoskeletal: Negative.   Skin: Negative.   Neurological: Negative.   Psychiatric/Behavioral:  The patient is nervous/anxious.   All other ROS negative except what is listed above and in the HPI.      Objective:    BP (!) 103/50   Pulse 72   Ht _0  (1.575 m)   Wt (!) 329 lb (149.2 kg)   BMI 60.17 kg/m   Wt Readings from Last 3 Encounters:  10/02/21 (!) 329 lb (149.2 kg)  09/10/21 (!) 329 lb (149.2 kg)  08/20/21 (!) 338 lb 9.6 oz (153.6 kg)    Physical Exam Vitals and nursing note reviewed.  Constitutional:      General: She is not in acute distress.    Appearance:  Normal appearance.  HENT:     Head: Normocephalic and atraumatic.     Right Ear: Tympanic membrane, ear canal and external ear normal.     Left Ear: Tympanic membrane, ear canal and external ear normal.     Nose: Nose normal.     Mouth/Throat:     Mouth: Mucous membranes are moist.     Pharynx: Oropharynx is clear.  Eyes:     Conjunctiva/sclera: Conjunctivae normal.  Cardiovascular:     Rate and Rhythm: Normal rate and regular rhythm.     Pulses: Normal pulses.     Heart sounds: Normal heart sounds.  Pulmonary:     Effort: Pulmonary effort is normal.     Breath sounds: Normal breath sounds.  Abdominal:     General: Bowel sounds are normal.     Palpations: Abdomen is soft.     Tenderness: There is no abdominal tenderness.  Musculoskeletal:        General: Normal range of motion.     Cervical back: Normal range of motion.     Comments: Strength 5/5 in bilateral upper and lower extremties   Skin:    General: Skin is warm and dry.  Neurological:     General: No focal deficit present.     Mental Status: She is alert and oriented to person, place, and time.     Cranial Nerves: No cranial nerve deficit.     Coordination: Coordination normal.     Gait: Gait normal.  Psychiatric:        Mood and Affect: Mood normal.        Behavior: Behavior normal.        Thought Content: Thought content normal.        Judgment: Judgment normal.    Results for orders placed or performed in visit on 61/53/79  Basic metabolic panel  Result Value Ref Range   Glucose CANCELED mg/dL   BUN CANCELED    Creatinine, Ser CANCELED  Sodium CANCELED    Potassium CANCELED    Chloride CANCELED    CO2 CANCELED    Calcium CANCELED       Assessment & Plan:   Problem List Items Addressed This Visit       Cardiovascular and Mediastinum   Hypertension associated with diabetes ([place])    Chronic, stable. She had one episode of blood pressure in the 90s at home where she felt a little dizzy and not  well. She ate a snack and her symptoms improved. Continue checking her blood pressure daily. If continues to have low readings under systolic 132, call the office.       Relevant Medications   metFORMIN (GLUCOPHAGE) 500 MG tablet     Endocrine   Type 2 diabetes mellitus with morbid obesity ([place]) - Primary    Check A1C today and will adjust regimen as needed. Continue diet and lifestyle changes. Follow up in 6 months. Refills sent to the pharmacy.       Relevant Medications   metFORMIN (GLUCOPHAGE) 500 MG tablet   Other Relevant Orders   Bayer DCA Hb A1c Waived   Hypothyroid    Chronic, stable Last TSH in August 2022 was within normal range. Continue current regimen. Refills sent to the pharmacy.      Relevant Medications   levothyroxine (SYNTHROID) 50 MCG tablet     Other   Morbid obesity ([place])    Has lost 10 pounds in the last few months. Congratulated her on this. Recommend continued diet and lifestyle changes including exercise 30 minutes a day, 5 days a week with a goal of losing 1-2 pounds per week.       Relevant Medications   metFORMIN (GLUCOPHAGE) 500 MG tablet   Perimenopause    Endorses fluctuating menstrual periods and mood changes. She has been trying OTC supplements without much relief. She is wanting to start medication for anxiety. Will start her on lexapro 62m daily. Check FSH, LH, and estradiol levels today.       Relevant Orders   FSH/LH   Estradiol   Anxiety    Concerned with worsening anxiety in the last few months. She would like to start medication today. Will start her on lexapro 181mdaily. Her GAD7 was a 8 and PHQ-9 was a 6. She denies SI/HI. Call the office or go to ER if develop SI/HI. Follow up in 4-6 weeks.       Relevant Medications   escitalopram (LEXAPRO) 10 MG tablet   Other Visit Diagnoses     Routine general medical examination at a health care facility       Health maintenance reviewed and updated. She would like pap at her next visit  with PCP   Screen for colon cancer       Cologuard ordered today   Relevant Orders   Cologuard   Encounter for screening mammogram for malignant neoplasm of breast       Mammogram ordered today   Relevant Orders   MM 3D SCREEN BREAST BILATERAL   Need for immunization against influenza       Flu vaccine given today   Relevant Orders   Flu Vaccine QUAD 36+ mos IM (Fluarix, Fluzone & Afluria Quad PF (Completed)        Follow up plan: Return in about 4 weeks (around 10/30/2021) for 4-6 weeks for anxiety and pap with Jolene.   LABORATORY TESTING:  - Pap smear:  wants to wait for PCP  IMMUNIZATIONS:   -  Tdap: Tetanus vaccination status reviewed: last tetanus booster within 10 years. - Influenza: Administered today - Pneumovax: Not applicable - Prevnar: Not applicable - HPV: Not applicable - Zostavax vaccine: Not applicable  SCREENING: -Mammogram: Ordered today  - Colonoscopy: Ordered today  - Bone Density: Not applicable  -Hearing Test: Not applicable  -Spirometry: Not applicable   PATIENT COUNSELING:   Advised to take 1 mg of folate supplement per day if capable of pregnancy.   Sexuality: Discussed sexually transmitted diseases, partner selection, use of condoms, avoidance of unintended pregnancy  and contraceptive alternatives.   Advised to avoid cigarette smoking.  I discussed with the patient that most people either abstain from alcohol or drink within safe limits (<=14/week and <=4 drinks/occasion for males, <=7/weeks and <= 3 drinks/occasion for females) and that the risk for alcohol disorders and other health effects rises proportionally with the number of drinks per week and how often a drinker exceeds daily limits.  Discussed cessation/primary prevention of drug use and availability of treatment for abuse.   Diet: Encouraged to adjust caloric intake to maintain  or achieve ideal body weight, to reduce intake of dietary saturated fat and total fat, to limit sodium  intake by avoiding high sodium foods and not adding table salt, and to maintain adequate dietary potassium and calcium preferably from fresh fruits, vegetables, and low-fat dairy products.    stressed the importance of regular exercise  Injury prevention: Discussed safety belts, safety helmets, smoke detector, smoking near bedding or upholstery.   Dental health: Discussed importance of regular tooth brushing, flossing, and dental visits.    NEXT PREVENTATIVE PHYSICAL DUE IN 1 YEAR. Return in about 4 weeks (around 10/30/2021) for 4-6 weeks for anxiety and pap with Jolene.

## 2021-10-02 ENCOUNTER — Encounter: Payer: Self-pay | Admitting: Nurse Practitioner

## 2021-10-02 ENCOUNTER — Other Ambulatory Visit: Payer: Self-pay

## 2021-10-02 ENCOUNTER — Ambulatory Visit (INDEPENDENT_AMBULATORY_CARE_PROVIDER_SITE_OTHER): Payer: Managed Care, Other (non HMO) | Admitting: Nurse Practitioner

## 2021-10-02 DIAGNOSIS — Z1231 Encounter for screening mammogram for malignant neoplasm of breast: Secondary | ICD-10-CM | POA: Diagnosis not present

## 2021-10-02 DIAGNOSIS — E1169 Type 2 diabetes mellitus with other specified complication: Secondary | ICD-10-CM

## 2021-10-02 DIAGNOSIS — I152 Hypertension secondary to endocrine disorders: Secondary | ICD-10-CM

## 2021-10-02 DIAGNOSIS — E039 Hypothyroidism, unspecified: Secondary | ICD-10-CM

## 2021-10-02 DIAGNOSIS — Z23 Encounter for immunization: Secondary | ICD-10-CM

## 2021-10-02 DIAGNOSIS — N951 Menopausal and female climacteric states: Secondary | ICD-10-CM

## 2021-10-02 DIAGNOSIS — Z0000 Encounter for general adult medical examination without abnormal findings: Secondary | ICD-10-CM | POA: Diagnosis not present

## 2021-10-02 DIAGNOSIS — Z1211 Encounter for screening for malignant neoplasm of colon: Secondary | ICD-10-CM

## 2021-10-02 DIAGNOSIS — E1159 Type 2 diabetes mellitus with other circulatory complications: Secondary | ICD-10-CM

## 2021-10-02 DIAGNOSIS — F419 Anxiety disorder, unspecified: Secondary | ICD-10-CM

## 2021-10-02 LAB — BAYER DCA HB A1C WAIVED: HB A1C (BAYER DCA - WAIVED): 5.7 % — ABNORMAL HIGH (ref 4.8–5.6)

## 2021-10-02 MED ORDER — METFORMIN HCL 500 MG PO TABS: ORAL_TABLET | ORAL | 3 refills | Status: DC

## 2021-10-02 MED ORDER — ESCITALOPRAM OXALATE 10 MG PO TABS: 10.0000 mg | ORAL_TABLET | Freq: Every day | ORAL | 1 refills | Status: DC

## 2021-10-02 MED ORDER — LEVOCETIRIZINE DIHYDROCHLORIDE 5 MG PO TABS: 5.0000 mg | ORAL_TABLET | Freq: Every day | ORAL | 3 refills | Status: DC

## 2021-10-02 MED ORDER — LEVOTHYROXINE SODIUM 50 MCG PO TABS
50.0000 ug | ORAL_TABLET | Freq: Every day | ORAL | 4 refills | Status: DC
Start: 2021-10-02 — End: 2022-10-04

## 2021-10-02 NOTE — Assessment & Plan Note (Addendum)
Chronic, stable Last TSH in August 2022 was within normal range. Continue current regimen. Refills sent to the pharmacy.

## 2021-10-02 NOTE — Assessment & Plan Note (Signed)
Chronic, stable. She had one episode of blood pressure in the 90s at home where she felt a little dizzy and not well. She ate a snack and her symptoms improved. Continue checking her blood pressure daily. If continues to have low readings under systolic 482, call the office.

## 2021-10-02 NOTE — Assessment & Plan Note (Addendum)
Endorses fluctuating menstrual periods and mood changes. She has been trying OTC supplements without much relief. She is wanting to start medication for anxiety. Will start her on lexapro 10mg  daily. Check FSH, LH, and estradiol levels today.

## 2021-10-02 NOTE — Assessment & Plan Note (Signed)
Check A1C today and will adjust regimen as needed. Continue diet and lifestyle changes. Follow up in 6 months. Refills sent to the pharmacy.

## 2021-10-02 NOTE — Patient Instructions (Signed)
[facility]  Address: [address], [place], [place] [address]  Phone: [phone]  

## 2021-10-02 NOTE — Assessment & Plan Note (Signed)
Concerned with worsening anxiety in the last few months. She would like to start medication today. Will start her on lexapro 10mg  daily. Her GAD7 was a 8 and PHQ-9 was a 6. She denies SI/HI. Call the office or go to ER if develop SI/HI. Follow up in 4-6 weeks.

## 2021-10-02 NOTE — Assessment & Plan Note (Signed)
Has lost 10 pounds in the last few months. Congratulated her on this. Recommend continued diet and lifestyle changes including exercise 30 minutes a day, 5 days a week with a goal of losing 1-2 pounds per week.

## 2021-10-03 LAB — FSH/LH
FSH: 7.3 m[IU]/mL
LH: 3.9 m[IU]/mL

## 2021-10-03 LAB — ESTRADIOL: Estradiol: 47.9 pg/mL

## 2021-10-19 ENCOUNTER — Ambulatory Visit: Payer: Self-pay | Admitting: *Deleted

## 2021-10-19 NOTE — Telephone Encounter (Signed)
Pt called in c/o having chest tightness  all over chest but more in left side with radiation to upper back and left side of neck.   She is also breaking out in a sweat.   This happened Sat. While walking to a building 100 feet away.   She sat down and then came back into the house and has been laying down all weekend because she has not felt well.   BP elevated at 150/90.   Also c/o nausea.  I have referred her to the ED.   She is agreeable to going and her husband is going to take her to [facility] ED now.  I sent my notes to Marnee Guarneri, NP at [facility]

## 2021-10-19 NOTE — Telephone Encounter (Signed)
Noted and agree with plan to go to ER due to chest pain with radiation to r/o cardiac event.  Will follow-up with her after ER visit.

## 2021-10-19 NOTE — Telephone Encounter (Signed)
Reason for Disposition  Pain also in shoulder(s) or arm(s) or jaw (Exception: pain is clearly made worse by movement)  Answer Assessment - Initial Assessment Questions 1. LOCATION: "Where does it hurt?"       Pt calling in with high BP and chest tightness.   B 150/90.   I'm having pain in my upper back and left neck.   Having tightness in chest left side but all the way around. 2. RADIATION: "Does the pain go anywhere else?" (e.g., into neck, jaw, arms, back)     Upper back and left neck 3. ONSET: "When did the chest pain begin?" (Minutes, hours or days)      Sat. About mid day.   I had walked to a building about 100 feet away then I sat down and then came back into the house and haven't felt good all weekend. No shortness of breath 4. PATTERN "Does the pain come and go, or has it been constant since it started?"  "Does it get worse with exertion?"      It's a constant pain 5. DURATION: "How long does it last" (e.g., seconds, minutes, hours)     Constant 6. SEVERITY: "How bad is the pain?"  (e.g., Scale 1-10; mild, moderate, or severe)    - MILD (1-3): doesn't interfere with normal activities     - MODERATE (4-7): interferes with normal activities or awakens from sleep    - SEVERE (8-10): excruciating pain, unable to do any normal activities       5/10 pain scale 7. CARDIAC RISK FACTORS: "Do you have any history of heart problems or risk factors for heart disease?" (e.g., angina, prior heart attack; diabetes, high blood pressure, high cholesterol, smoker, or strong family history of heart disease)     Since August I have hypertension.   On 2 different medications.   I have diabetes.   I'm on metformin.    I'm on cholesterol medication.   My mom high BP.  Not a smoker 8. PULMONARY RISK FACTORS: "Do you have any history of lung disease?"  (e.g., blood clots in lung, asthma, emphysema, birth control pills)     No lung issues. 9. CAUSE: "What do you think is causing the chest pain?"     The BP  being elevated. 10. OTHER SYMPTOMS: "Do you have any other symptoms?" (e.g., dizziness, nausea, vomiting, sweating, fever, difficulty breathing, cough)       Breaking out in a sweat, I'm laying down mostly all weekend.    Laying down helps my elevated BP I think.    I'm having a little nausea.   No vomiting.  My stomach's been upset a little 11. PREGNANCY: "Is there any chance you are pregnant?" "When was your last menstrual period?"       No!  Protocols used: Chest Pain-A-AH

## 2021-10-21 NOTE — Telephone Encounter (Signed)
Called and spoke to patient. She states that she was seen at the ER in [place]. States she is a little bit better. Has a follow up appointment on 11/02/21.

## 2021-10-26 ENCOUNTER — Ambulatory Visit: Payer: Self-pay

## 2021-10-26 NOTE — Telephone Encounter (Signed)
Pt. Reports she was seen in ED with chest pain 10/19/21. States "they think it was the Lexapro that caused my BP to go up and the chest pain." Requests appointment this week to talk to PCP about this. No availability. Please advise.     Answer Assessment - Initial Assessment Questions 1. NAME of MEDICATION: "What medicine are you calling about?"     Lexapro 2. QUESTION: "What is your question?" (e.g., double dose of medicine, side effect)     Side effect 3. PRESCRIBING HCP: "Who prescribed it?" Reason: if prescribed by specialist, call should be referred to that group.     "Lauren" 4. SYMPTOMS: "Do you have any symptoms?"     Had elevated BP and chest pain 5. SEVERITY: If symptoms are present, ask "Are they mild, moderate or severe?"     They were severe 6. PREGNANCY:  "Is there any chance that you are pregnant?" "When was your last menstrual period?"     No  Protocols used: Medication Question Call-A-AH

## 2021-10-26 NOTE — Telephone Encounter (Signed)
Called pt and scheduled her for 11/16.

## 2021-10-28 ENCOUNTER — Other Ambulatory Visit: Payer: Self-pay

## 2021-10-28 ENCOUNTER — Encounter: Payer: Self-pay | Admitting: Nurse Practitioner

## 2021-10-28 ENCOUNTER — Ambulatory Visit (INDEPENDENT_AMBULATORY_CARE_PROVIDER_SITE_OTHER): Payer: Managed Care, Other (non HMO) | Admitting: Nurse Practitioner

## 2021-10-28 VITALS — BP 112/66 | HR 84 | Temp 98.3°F | Wt 329.0 lb

## 2021-10-28 DIAGNOSIS — R000 Tachycardia, unspecified: Secondary | ICD-10-CM | POA: Insufficient documentation

## 2021-10-28 DIAGNOSIS — E1159 Type 2 diabetes mellitus with other circulatory complications: Secondary | ICD-10-CM | POA: Diagnosis not present

## 2021-10-28 DIAGNOSIS — F419 Anxiety disorder, unspecified: Secondary | ICD-10-CM

## 2021-10-28 DIAGNOSIS — I152 Hypertension secondary to endocrine disorders: Secondary | ICD-10-CM

## 2021-10-28 NOTE — Assessment & Plan Note (Signed)
Due to family history and episodes of this with exertion will place referral to cardiology for further work-up, suspect multifactorial with obesity and possible sleep apnea present (she has not obtained study as recommended).  May benefit from echo and stress test to further assess.

## 2021-10-28 NOTE — Assessment & Plan Note (Signed)
BMI 60.17 with T2DM.  Recommended eating smaller high protein, low fat meals more frequently and exercising 30 mins a day 5 times a week with a goal of 10-15lb weight loss in the next 3 months. Patient voiced their understanding and motivation to adhere to these recommendations.  Would benefit sleep study, has been assessed to obtain but never received due to home study recommendation.

## 2021-10-28 NOTE — Assessment & Plan Note (Signed)
Chronic, ongoing.  BP stable in office today.  Continue Losartan 50 MG daily and Amlodipine 2.5 MG to take at night. Tolerating medications. Recommend she monitor BP at least a few mornings a week at home and document.  DASH diet at home. Labs up to date.

## 2021-10-28 NOTE — Patient Instructions (Signed)
Sinus Tachycardia °Sinus tachycardia is a kind of fast heartbeat. In sinus tachycardia, the heart beats more than 100 times a minute. Sinus tachycardia starts in a part of the heart called the sinus node. Sinus tachycardia may be harmless, or it may be a sign of a serious condition. °What are the causes? °This condition may be caused by: °Exercise or exertion. °A fever. °Pain. °Loss of body fluids (dehydration). °Severe bleeding (hemorrhage). °Anxiety and stress. °Certain substances, including: °Alcohol. °Caffeine. °Tobacco and nicotine products. °Cold medicines. °Illegal drugs. °Medical conditions including: °Heart disease. °An infection. °An overactive thyroid (hyperthyroidism). °A lack of red blood cells (anemia). °What are the signs or symptoms? °Symptoms of this condition include: °A feeling that the heart is beating quickly (palpitations). °Suddenly noticing your heartbeat (cardiac awareness). °Dizziness. °Tiredness (fatigue). °Shortness of breath. °Chest pain. °Nausea. °Fainting. °How is this diagnosed? °This condition is diagnosed with: °A physical exam. °Other tests, such as: °Blood tests. °An electrocardiogram (ECG). This test measures the electrical activity of the heart. °Ambulatory cardiac monitor. This records your heartbeats for 24 hours or more. °You may be referred to a heart specialist (cardiologist). °How is this treated? °Treatment for this condition depends on the cause or the underlying condition. Treatment may involve: °Treating the underlying condition. °Taking new medicines or changing your current medicines as told by your health care provider. °Making changes to your diet or lifestyle. °Follow these instructions at home: °Lifestyle ° °Do not use any products that contain nicotine or tobacco, such as cigarettes and e-cigarettes. If you need help quitting, ask your health care provider. °Do not use illegal drugs, such as cocaine. °Learn relaxation methods to help you when you get stressed or  anxious. These include deep breathing. °Avoid caffeine or other stimulants. °Alcohol use ° °Do not drink alcohol if: °Your health care provider tells you not to drink. °You are pregnant, may be pregnant, or are planning to become pregnant. °If you drink alcohol, limit how much you have: °0-1 drink a day for women. °0-2 drinks a day for men. °Be aware of how much alcohol is in your drink. In the [place] one drink equals one typical bottle of beer (12 oz), one-half glass of wine (5 oz), or one shot of hard liquor (1½ oz). °General instructions °Drink enough fluids to keep your urine pale yellow. °Take over-the-counter and prescription medicines only as told by your health care provider. °Keep all follow-up visits as told by your health care provider. This is important. °Contact a health care provider if you have: °A fever. °Vomiting or diarrhea that does not go away. °Get help right away if you: °Have pain in your chest, upper arms, jaw, or neck. °Become weak or dizzy. °Feel faint. °Have palpitations that do not go away. °Summary °In sinus tachycardia, the heart beats more than 100 times a minute. °Sinus tachycardia may be harmless, or it may be a sign of a serious condition. °Treatment for this condition depends on the cause or the underlying condition. °Get help right away if you have pain in your chest, upper arms, jaw, or neck. °This information is not intended to replace advice given to you by your health care provider. Make sure you discuss any questions you have with your health care provider. °Document Revised: 04/09/2021 Document Reviewed: 04/09/2021 °Elsevier Patient Education © 2022 [employer] ° °

## 2021-10-28 NOTE — Progress Notes (Signed)
BP 112/66   Pulse 84   Temp 98.3 F (36.8 C) (Oral)   Wt (!) 329 lb (149.2 kg)   SpO2 98%   BMI 60.17 kg/m    Subjective:    Patient ID: Amy [name], female    DOB: Jul 31, 1976, 45 y.o.   MRN: [id number]  HPI: Amy [name] is a 45 y.o. female  Chief Complaint  Patient presents with   Medication Problem    Patient states her anxiety medication she will not take anymore as he caused her blood pressure go up and states she went to the emergency department. Patient states she would like to discuss with provider.    ANXIETY/STRESS Was started on Lexapro at a recent visit on 10/02/21, she reports this caused her blood pressure to go up and she went to ER -- they told her this could cause BP elevation and she has stopped taking.  She noticed her blood pressure started increasing immediately with this.  Her son is the number one stressor for her.  Blood pressures have improved since stopping Lexapro.   She reports at home sometimes her heart rate goes up to 100 to 120 when this happens she gets back pain and her face flushes.  She go from sitting and then walk to kitchen -- HR goes from 60 range to 110, starts to feel symptoms with the elevation.  Does have family history of heart disease -- paternal grandfather had multiple MI, mother has HTN and heart disease, paternal mother had heart disease.  Recent EKG in [facility] ER did note left axis shift, but NSR.   Duration:stable Anxious mood: no  Excessive worrying: no Irritability: yes  Sweating: no Nausea: no Palpitations:no Hyperventilation: no Panic attacks: no Agoraphobia: no  Obscessions/compulsions: no Depressed mood: no Depression screen [facility] 2/9 10/28/2021 10/02/2021 09/10/2021 12/23/2020 11/04/2020  Decreased Interest 1 0 2 0 0  Down, Depressed, Hopeless 0 0 0 0 0  PHQ - 2 Score 1 0 2 0 0  Altered sleeping 0 2 2 0 -  Tired, decreased energy 1 2 3  0 -  Change in appetite 0 1 1 0 -  Feeling bad or failure about yourself   0 0 0 0 -  Trouble concentrating 1 1 2  0 -  Moving slowly or fidgety/restless 0 0 0 0 -  Suicidal thoughts 0 0 0 0 -  PHQ-9 Score 3 6 10  0 -  Difficult doing work/chores Not difficult at all Somewhat difficult Somewhat difficult - -   Anhedonia: no Weight changes: no Insomnia: none -- she did go for sleep apnea consultation, but did not have testing Hypersomnia: no Fatigue/loss of energy: no Feelings of worthlessness: no Feelings of guilt: no Impaired concentration/indecisiveness: no Suicidal ideations: no  Crying spells: no Recent Stressors/Life Changes: no   Relationship problems: no   Family stress: no     Financial stress: no    Job stress: no    Recent death/loss: no  GAD 7 : Generalized Anxiety Score 10/28/2021 10/02/2021 09/10/2021 04/28/2020  Nervous, Anxious, on Edge 0 2 3 0  Control/stop worrying 0 0 1 0  Worry too much - different things 0 1 0 0  Trouble relaxing 1 2 3  0  Restless 0 2 2 0  Easily annoyed or irritable 1 1 2  0  Afraid - awful might happen 0 0 0 0  Total GAD 7 Score 2 8 11  0  Anxiety Difficulty Not difficult at all Somewhat difficult Somewhat  difficult Not difficult at all     Relevant past medical, surgical, family and social history reviewed and updated as indicated. Interim medical history since our last visit reviewed. Allergies and medications reviewed and updated.  Review of Systems  Constitutional: Negative.   Respiratory: Negative.    Cardiovascular: Negative.   Gastrointestinal: Negative.   Neurological: Negative.   Psychiatric/Behavioral:  Negative for decreased concentration, self-injury, sleep disturbance and suicidal ideas. The patient is not nervous/anxious.    Per HPI unless specifically indicated above     Objective:    BP 112/66   Pulse 84   Temp 98.3 F (36.8 C) (Oral)   Wt (!) 329 lb (149.2 kg)   SpO2 98%   BMI 60.17 kg/m   Wt Readings from Last 3 Encounters:  10/28/21 (!) 329 lb (149.2 kg)  10/02/21 (!) 329 lb  (149.2 kg)  09/10/21 (!) 329 lb (149.2 kg)    Physical Exam Vitals and nursing note reviewed.  Constitutional:      General: She is awake. She is not in acute distress.    Appearance: She is well-developed and well-groomed. She is morbidly obese. She is not ill-appearing.  HENT:     Head: Normocephalic.     Right Ear: Hearing normal.     Left Ear: Hearing normal.  Eyes:     General: Lids are normal.        Right eye: No discharge.        Left eye: No discharge.     Conjunctiva/sclera: Conjunctivae normal.     Pupils: Pupils are equal, round, and reactive to light.  Neck:     Thyroid: No thyromegaly.     Vascular: No carotid bruit.  Cardiovascular:     Rate and Rhythm: Normal rate and regular rhythm.     Heart sounds: Normal heart sounds. No murmur heard.   No gallop.  Pulmonary:     Effort: Pulmonary effort is normal. No accessory muscle usage or respiratory distress.     Breath sounds: Normal breath sounds.  Abdominal:     General: Bowel sounds are normal.     Palpations: Abdomen is soft.  Musculoskeletal:     Cervical back: Normal range of motion and neck supple.     Right lower leg: No edema.     Left lower leg: No edema.  Skin:    General: Skin is warm and dry.  Neurological:     Mental Status: She is alert and oriented to person, place, and time.  Psychiatric:        Attention and Perception: Attention normal.        Mood and Affect: Mood normal.        Speech: Speech normal.        Behavior: Behavior normal. Behavior is cooperative.        Thought Content: Thought content normal.   Results for orders placed or performed in visit on 10/02/21  Bayer DCA Hb A1c Waived  Result Value Ref Range   HB A1C (BAYER DCA - WAIVED) 5.7 (H) 4.8 - 5.6 %  FSH/LH  Result Value Ref Range   LH 3.9 mIU/mL   FSH 7.3 mIU/mL  Estradiol  Result Value Ref Range   Estradiol 47.9 pg/mL      Assessment & Plan:   Problem List Items Addressed This Visit       Cardiovascular and  Mediastinum   Hypertension associated with diabetes ([place]) - Primary    Chronic, ongoing.  BP stable in office today.  Continue Losartan 50 MG daily and Amlodipine 2.5 MG to take at night. Tolerating medications. Recommend she monitor BP at least a few mornings a week at home and document.  DASH diet at home. Labs up to date.        Other   Anxiety    Improved at this time, had reaction to Lexapro which she feels elevated BP.  Will remain without medication at this time as scores are improved.  Could consider Zoloft in future if needed or Buspar.  Denies SI/HI.      Morbid obesity (HCC)    BMI 60.17 with T2DM.  Recommended eating smaller high protein, low fat meals more frequently and exercising 30 mins a day 5 times a week with a goal of 10-15lb weight loss in the next 3 months. Patient voiced their understanding and motivation to adhere to these recommendations.  Would benefit sleep study, has been assessed to obtain but never received due to home study recommendation.       Tachycardia    Due to family history and episodes of this with exertion will place referral to cardiology for further work-up, suspect multifactorial with obesity and possible sleep apnea present (she has not obtained study as recommended).  May benefit from echo and stress test to further assess.      Relevant Orders   Ambulatory referral to Cardiology     Follow up plan: Return for as schedule on Monday.

## 2021-10-28 NOTE — Assessment & Plan Note (Addendum)
Improved at this time, had reaction to Lexapro which she feels elevated BP.  Will remain without medication at this time as scores are improved.  Could consider Zoloft in future if needed or Buspar.  Denies SI/HI.

## 2021-11-02 ENCOUNTER — Other Ambulatory Visit (HOSPITAL_COMMUNITY)
Admission: RE | Admit: 2021-11-02 | Discharge: 2021-11-02 | Disposition: A | Discharge status: Home / Self Care | Payer: Managed Care, Other (non HMO) | Source: Ambulatory Visit | Attending: Nurse Practitioner | Admitting: Nurse Practitioner

## 2021-11-02 ENCOUNTER — Ambulatory Visit: Payer: Managed Care, Other (non HMO) | Admitting: Nurse Practitioner

## 2021-11-02 ENCOUNTER — Encounter: Payer: Self-pay | Admitting: Nurse Practitioner

## 2021-11-02 ENCOUNTER — Other Ambulatory Visit: Payer: Self-pay

## 2021-11-02 VITALS — BP 100/65 | HR 76 | Wt 315.8 lb

## 2021-11-02 DIAGNOSIS — Z124 Encounter for screening for malignant neoplasm of cervix: Secondary | ICD-10-CM | POA: Diagnosis present

## 2021-11-02 DIAGNOSIS — I152 Hypertension secondary to endocrine disorders: Secondary | ICD-10-CM

## 2021-11-02 DIAGNOSIS — E1159 Type 2 diabetes mellitus with other circulatory complications: Secondary | ICD-10-CM

## 2021-11-02 DIAGNOSIS — L739 Follicular disorder, unspecified: Secondary | ICD-10-CM | POA: Insufficient documentation

## 2021-11-02 DIAGNOSIS — R000 Tachycardia, unspecified: Secondary | ICD-10-CM

## 2021-11-02 NOTE — Progress Notes (Signed)
BP 100/65   Pulse 76   Wt (!) 315 lb 12.8 oz (143.2 kg)   SpO2 92%   BMI 57.76 kg/m    Subjective:    Patient ID: Amy [name], female    DOB: [birth year], 45 y.o.   MRN: [id number]  HPI: Amy [name] is a 45 y.o. female  Chief Complaint  Patient presents with   Pap    Patient is here for a PAP. Patient denies having any concerns at today's visit.    Presents today for female exam with pap.  Her last pap was several years ago.    HYPERTENSION Improved since starting Lexapro.   Reports at home sometimes her heart rate goes up to 100 to 140 when this happens she gets back pain and her face flushes.  She go from sitting and then walk to kitchen -- HR goes from 60 range to 140, starts to feel symptoms with the elevation.  Does have family history of heart disease -- paternal grandfather had multiple MI, mother has HTN and heart disease, paternal mother had heart disease.  Recent EKG in [facility] ER did note left axis shift, but NSR.  She is scheduled to see cardiology for initial visit tomorrow.  When she got out of shower this morning it was 140, her face flushed with this. Hypertension status: stable  Satisfied with current treatment? yes Duration of hypertension: chronic BP monitoring frequency:  daily BP range: 112/72 range BP medication side effects:  no Medication compliance: good compliance Previous BP meds: as above Aspirin: no Recurrent headaches: no Visual changes: no Palpitations: no Dyspnea: no Chest pain: no Lower extremity edema: no Dizzy/lightheaded: no   Relevant past medical, surgical, family and social history reviewed and updated as indicated. Interim medical history since our last visit reviewed. Allergies and medications reviewed and updated.  Review of Systems  Constitutional: Negative.   Respiratory: Negative.    Cardiovascular: Negative.   Gastrointestinal: Negative.   Neurological: Negative.   Psychiatric/Behavioral:  Negative for  decreased concentration, self-injury, sleep disturbance and suicidal ideas. The patient is not nervous/anxious.    Per HPI unless specifically indicated above     Objective:    BP 100/65   Pulse 76   Wt (!) 315 lb 12.8 oz (143.2 kg)   SpO2 92%   BMI 57.76 kg/m   Wt Readings from Last 3 Encounters:  11/02/21 (!) 315 lb 12.8 oz (143.2 kg)  10/28/21 (!) 329 lb (149.2 kg)  10/02/21 (!) 329 lb (149.2 kg)    Physical Exam Vitals and nursing note reviewed. Exam conducted with a chaperone present.  Constitutional:      General: She is awake. She is not in acute distress.    Appearance: She is well-developed and well-groomed. She is morbidly obese. She is not ill-appearing.  HENT:     Head: Normocephalic.     Right Ear: Hearing normal.     Left Ear: Hearing normal.  Eyes:     General: Lids are normal.        Right eye: No discharge.        Left eye: No discharge.     Conjunctiva/sclera: Conjunctivae normal.     Pupils: Pupils are equal, round, and reactive to light.  Neck:     Thyroid: No thyromegaly.     Vascular: No carotid bruit.  Cardiovascular:     Rate and Rhythm: Normal rate and regular rhythm.     Heart sounds: Normal heart sounds.  No murmur heard.   No gallop.  Pulmonary:     Effort: Pulmonary effort is normal. No accessory muscle usage or respiratory distress.     Breath sounds: Normal breath sounds.  Abdominal:     General: Bowel sounds are normal.     Palpations: Abdomen is soft.     Hernia: There is no hernia in the left inguinal area or right inguinal area.  Genitourinary:    Exam position: Lithotomy position.     Pubic Area: No rash or pubic lice.      Labia:        Right: No rash.        Left: No rash.      Vagina: Normal.     Cervix: Normal.     Uterus: Normal.      Adnexa: Right adnexa normal and left adnexa normal.       Comments: Cervix slightly posterior and midline, viewed with pap obtained. Musculoskeletal:     Cervical back: Normal range of  motion and neck supple.     Right lower leg: No edema.     Left lower leg: No edema.  Skin:    General: Skin is warm and dry.  Neurological:     Mental Status: She is alert and oriented to person, place, and time.  Psychiatric:        Attention and Perception: Attention normal.        Mood and Affect: Mood normal.        Speech: Speech normal.        Behavior: Behavior normal. Behavior is cooperative.        Thought Content: Thought content normal.    Results for orders placed or performed in visit on 10/02/21  Bayer DCA Hb A1c Waived  Result Value Ref Range   HB A1C (BAYER DCA - WAIVED) 5.7 (H) 4.8 - 5.6 %  FSH/LH  Result Value Ref Range   LH 3.9 mIU/mL   FSH 7.3 mIU/mL  Estradiol  Result Value Ref Range   Estradiol 47.9 pg/mL      Assessment & Plan:   Problem List Items Addressed This Visit       Cardiovascular and Mediastinum   Hypertension associated with diabetes ([place]) - Primary    Chronic, ongoing.  BP stable in office today.  Continue Losartan 50 MG daily and Amlodipine 2.5 MG to take at night. Tolerating medications. Recommend she monitor BP at least a few mornings a week at home and document.  DASH diet at home. Labs up to date.        Musculoskeletal and Integument   Folliculitis    Noted to left inner thigh during pap, currently no infection.  Recommend warm compresses at home and monitoring.  Return if any symptoms present.        Other   Morbid obesity (HCC)    BMI 57.76, has lost 14 pounds with diet changes.  Praised for this.  Recommended eating smaller high protein, low fat meals more frequently and exercising 30 mins a day 5 times a week with a goal of 10-15lb weight loss in the next 3 months. Patient voiced their understanding and motivation to adhere to these recommendations.       Tachycardia    Scheduled to see cardiology tomorrow, appreciate their input.  Has strong cardiac family history and current episodes of tachycardia with exertion,  suspect multifactorial with obesity and possible sleep apnea present (she has not obtained study as  recommended).  May benefit from echo and stress test to further assess.      Other Visit Diagnoses     Cervical cancer screening       Pap obtained today and sent to lab.   Relevant Orders   Cytology - PAP        Follow up plan: Return in about 8 weeks (around 12/28/2021) for T2DM, HTN/HLD, TACHCARDIA.

## 2021-11-02 NOTE — Assessment & Plan Note (Signed)
BMI 57.76, has lost 14 pounds with diet changes.  Praised for this.  Recommended eating smaller high protein, low fat meals more frequently and exercising 30 mins a day 5 times a week with a goal of 10-15lb weight loss in the next 3 months. Patient voiced their understanding and motivation to adhere to these recommendations.

## 2021-11-02 NOTE — Assessment & Plan Note (Signed)
Noted to left inner thigh during pap, currently no infection.  Recommend warm compresses at home and monitoring.  Return if any symptoms present.

## 2021-11-02 NOTE — Patient Instructions (Signed)
Pap Test Why am I having this test? A Pap test, also called a Pap smear, is a screening test to check for signs of: Infection. Cancer of the cervix. The cervix is the lower part of the uterus that opens into the vagina. Changes that may be a sign that cancer is developing (precancerous changes). Women need this test on a regular basis. In general, you should have a Pap test every 3 years until you reach menopause or age 45. Women aged 30-60 may choose to have their Pap test done at the same time as an HPV (human papillomavirus) test every 5 years (instead of every 3 years). Your health care provider may recommend having Pap tests more or less often depending on your medical conditions and past Pap test results. What is being tested? Cervical cells are tested for signs of infection or abnormalities. What kind of sample is taken? Your health care provider will collect a sample of cells from the surface of your cervix. This will be done using a small cotton swab, plastic spatula, or brush that is inserted into your vagina using a tool called a speculum. This sample is often collected during a pelvic exam, when you are lying on your back on an exam table with your feet in footrests (stirrups). In some cases, fluids (secretions) from the cervix or vagina may also be collected. How do I prepare for this test? Be aware of where you are in your menstrual cycle. If you are menstruating on the day of the test, you may be asked to reschedule. You may need to reschedule if you have a known vaginal infection on the day of the test. Follow instructions from your health care provider about: Changing or stopping your regular medicines. Some medicines can cause abnormal test results, such as vaginal medicines and tetracycline. Avoiding douching 2-3 days before or the day of the test. Tell a health care provider about: Any allergies you have. All medicines you are taking, including vitamins, herbs, eye drops,  creams, and over-the-counter medicines. Any bleeding problems you have. Any surgeries you have had. Any medical conditions you have. Whether you are pregnant or may be pregnant. How are the results reported? Your test results will be reported as either abnormal or normal. What do the results mean? A normal test result means that you do not have signs of cancer of the cervix. An abnormal result may mean that you have: Cancer. A Pap test by itself is not enough to diagnose cancer. You will have more tests done if cancer is suspected. Precancerous changes in your cervix. Inflammation of the cervix. An STI (sexually transmitted infection). A fungal infection. A parasite infection. Talk with your health care provider about what your results mean. In some cases, your health care provider may do more testing to confirm the results. Questions to ask your health care provider Ask your health care provider, or the department that is doing the test: When will my results be ready? How will I get my results? What are my treatment options? What other tests do I need? What are my next steps? Summary In general, women should have a Pap test every 3 years until they reach menopause or age 39. Your health care provider will collect a sample of cells from the surface of your cervix. This will be done using a small cotton swab, plastic spatula, or brush. In some cases, fluids (secretions) from the cervix or vagina may also be collected. This information is not intended  to replace advice given to you by your health care provider. Make sure you discuss any questions you have with your health care provider. Document Revised: 02/27/2021 Document Reviewed: 02/27/2021 Elsevier Patient Education  [place].

## 2021-11-02 NOTE — Assessment & Plan Note (Signed)
Chronic, ongoing.  BP stable in office today.  Continue Losartan 50 MG daily and Amlodipine 2.5 MG to take at night. Tolerating medications. Recommend she monitor BP at least a few mornings a week at home and document.  DASH diet at home. Labs up to date.

## 2021-11-02 NOTE — Assessment & Plan Note (Signed)
Scheduled to see cardiology tomorrow, appreciate their input.  Has strong cardiac family history and current episodes of tachycardia with exertion, suspect multifactorial with obesity and possible sleep apnea present (she has not obtained study as recommended).  May benefit from echo and stress test to further assess.

## 2021-11-03 ENCOUNTER — Ambulatory Visit: Payer: Managed Care, Other (non HMO) | Admitting: Cardiovascular Disease

## 2021-11-03 ENCOUNTER — Encounter: Payer: Self-pay | Admitting: Cardiovascular Disease

## 2021-11-03 VITALS — BP 108/70 | HR 78 | Ht 62.0 in | Wt 317.2 lb

## 2021-11-03 DIAGNOSIS — E1169 Type 2 diabetes mellitus with other specified complication: Secondary | ICD-10-CM

## 2021-11-03 DIAGNOSIS — I152 Hypertension secondary to endocrine disorders: Secondary | ICD-10-CM | POA: Diagnosis not present

## 2021-11-03 DIAGNOSIS — E1159 Type 2 diabetes mellitus with other circulatory complications: Secondary | ICD-10-CM

## 2021-11-03 DIAGNOSIS — F419 Anxiety disorder, unspecified: Secondary | ICD-10-CM | POA: Diagnosis not present

## 2021-11-03 DIAGNOSIS — I479 Paroxysmal tachycardia, unspecified: Secondary | ICD-10-CM | POA: Diagnosis not present

## 2021-11-03 MED ORDER — LOSARTAN POTASSIUM 25 MG PO TABS: 50.0000 mg | ORAL_TABLET | Freq: Every day | ORAL | 3 refills | Status: DC

## 2021-11-03 MED ORDER — NEBIVOLOL HCL 5 MG PO TABS: 5.0000 mg | ORAL_TABLET | Freq: Every day | ORAL | 3 refills | Status: DC

## 2021-11-03 NOTE — Patient Instructions (Addendum)
Medication Instructions:  Please STOP  amlodipine Please DECREASE losartan down to 25 mg daily Please START  bystolic 5 mg daily  May take extra 5 mg as needed  for tachycardia   If you need a refill on your cardiac medications before your next appointment, please call your pharmacy.   Lab work: No new labs needed  Testing/Procedures: No new testing needed  Follow-Up: At [facility], you and your health needs are our priority.  As part of our continuing mission to provide you with exceptional heart care, we have created designated Provider Care Teams.  These Care Teams include your primary Cardiologist (physician) and Advanced Practice Providers (APPs -  Physician Assistants and Nurse Practitioners) who all work together to provide you with the care you need, when you need it.  You will need a follow up appointment as needed  Providers on your designated Care Team:   Murray Hodgkins, NP Christell Faith, PA-C Cadence Kathlen Mody, [place]   COVID-19 Vaccine Information can be found at: ShippingScam.co.uk For questions related to vaccine distribution or appointments, please email [email or web address] or call [phone].   Please monitor blood pressures and keep a log of your readings.   How to use a home blood pressure monitor. Be still. Measure at the same time every day. It's important to take the readings at the same time each day, such as morning and evening. Take reading approximately 1 1/2 to 2 hours after BP medications.   AVOID these things for 30 minutes before checking your blood pressure: Drinking caffeine. Drinking alcohol. Eating. Smoking. Exercising.   Five minutes before checking your blood pressure: Pee. Sit in a dining chair. Avoid sitting in a soft couch or armchair. Be quiet. Do not talk.       Sit correctly. Sit with your back straight and supported (on a dining chair, rather than a sofa). Your  feet should be flat on the floor and your legs should not be crossed. Your arm should be supported on a flat surface (such as a table) with the upper arm at heart level. Make sure the bottom of the cuff is placed directly above the bend of the elbow.

## 2021-11-03 NOTE — Progress Notes (Signed)
Cardiology Office Note  Date:  11/03/2021   ID:  Andree Moro, DOB 1976/02/14, MRN [id number]  PCP:  Venita Lick, NP   Chief Complaint  Patient presents with   New Patient (Initial Visit)    Ref Marnee Guarneri, NP for tachycardia.Patient c/o shortness of breath & chest pain when having tachycardia.Medications reviewed by the patient verbally.     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  PSH:    Past Surgical History:  Procedure Laterality Date   CESAREAN SECTION     X 2   HAND SURGERY Right    THUMB   HERNIA REPAIR     right thumb surgery      Current Outpatient Medications  Medication Sig Dispense Refill   Blood Glucose Monitoring Suppl (ONETOUCH VERIO) w/Device KIT Use to check blood sugar once a day, first thing in morning fasting, with goal blood sugar < 130. 1  kit 0   clobetasol ointment (TEMOVATE) 4.25 % Apply 1 application topically 2 (two) times daily. 45 g 1   glucose blood (ONETOUCH VERIO) test strip Use to check blood sugar once daily, fasting. 100 each 12   Lancets (ONETOUCH ULTRASOFT) lancets Use to check blood sugar once daily, fasting. 100 each 12   levocetirizine (XYZAL) 5 MG tablet Take 1 tablet (5 mg total) by mouth daily. 90 tablet 3   levothyroxine (SYNTHROID) 50 MCG tablet Take 1 tablet (50 mcg total) by mouth daily. 90 tablet 4   metFORMIN (GLUCOPHAGE) 500 MG tablet Start out taking 1 tablet (500 MG) by mouth daily at bedtime. 90 tablet 3   Multiple Vitamin (MULTIVITAMINS PO) Take 1 tablet by mouth daily.      nebivolol (BYSTOLIC) 5 MG tablet Take 1 tablet (5 mg total) by mouth daily. May take an extra 5 mg as needed for tachycardia 100 tablet 3   rosuvastatin (CRESTOR) 10 MG tablet Take 1 tablet (10 mg total) by mouth daily. 90 tablet 4   Vitamin D, Ergocalciferol, (DRISDOL) 1.25 MG (50000 UNIT) CAPS capsule Take 1 capsule by mouth once a week 12 capsule 3   losartan (COZAAR) 25 MG tablet Take 2 tablets (50 mg total) by mouth daily. 90 tablet 3   No current facility-administered medications for this visit.     Allergies:   Biaxin [clarithromycin]   Social History:  The patient  reports that she quit smoking  about 16 years ago. Her smoking use included cigarettes. She has a 2.50 pack-year smoking history. She has never used smokeless tobacco. She reports that she does not drink alcohol and does not use drugs.   Family History:   family history includes ADD / ADHD in her son; Allergies in her daughter; Anemia in her mother; Appendicitis in her paternal grandfather; Asthma in her daughter; Autoimmune disease in her mother; Cirrhosis in her father; Congestive Heart Failure in her paternal grandmother; Diabetes in her maternal grandmother; Glaucoma in her father; Hypertension in her maternal grandmother and mother; Osteoporosis in her  maternal grandmother; Thyroid disease in her maternal grandmother and mother.    Review of Systems: Review of Systems  Constitutional: Negative.   HENT: Negative.    Respiratory: Negative.    Cardiovascular:  Positive for palpitations.  Gastrointestinal: Negative.   Musculoskeletal: Negative.   Neurological: Negative.   Psychiatric/Behavioral: Negative.    All other systems reviewed and are negative.   PHYSICAL EXAM: VS:  BP 108/70 (BP Location: Right Wrist, Patient Position: Sitting, Cuff Size: Normal)   Pulse 78   Ht 5' 2" (1.575 m)   Wt (!) 317 lb 4 oz (143.9 kg)   SpO2 99%   BMI 58.03 kg/m  , BMI Body mass index is 58.03 kg/m. GEN: Well nourished, well developed, in no acute distress obese HEENT: normal Neck: no JVD, carotid bruits, or masses Cardiac: RRR; no murmurs, rubs, or gallops,no edema  Respiratory:  clear to auscultation bilaterally, normal work of breathing GI: soft, nontender, nondistended, + BS MS: no deformity or atrophy Skin: warm and dry, no rash Neuro:  Strength and sensation are intact Psych: euthymic mood, full affect   Recent Labs: 07/16/2021: ALT 14; Hemoglobin 14.1; Magnesium 2.1; Platelets 196; TSH 2.150 08/20/2021: BUN CANCELED; Creatinine, Ser CANCELED; Potassium CANCELED; Sodium CANCELED    Lipid Panel Lab Results  Component Value Date   CHOL 170 06/24/2021   HDL 44 06/24/2021   LDLCALC 111 (H) 06/24/2021   TRIG 77 06/24/2021      Wt Readings from Last 3 Encounters:  11/03/21 (!) 317 lb 4 oz (143.9 kg)  11/02/21 (!) 315 lb 12.8 oz (143.2 kg)  10/28/21 (!) 329 lb (149.2 kg)       ASSESSMENT AND PLAN:  Problem List Items Addressed This Visit       Cardiology Problems   Hypertension associated with diabetes ([place]) - Primary   Relevant Medications   losartan (COZAAR) 25 MG tablet   nebivolol (BYSTOLIC) 5 MG tablet   Other Relevant Orders   EKG 12-Lead     Other   Anxiety   Type 2 diabetes mellitus with morbid obesity (HCC)    Relevant Medications   losartan (COZAAR) 25 MG tablet   Other Visit Diagnoses     Paroxysmal tachycardia (H[facility])       Relevant Medications   losartan (COZAAR) 25 MG tablet   nebivolol (BYSTOLIC) 5 MG tablet       Essential hypertension Blood pressure somewhat low on today's visit Recommend she hold amlodipine 2.5 Given her tachycardia we will recommend she start Bystolic 5 mg daily Decrease losartan down to 50 from 100 If blood pressure runs high I would increase Bystolic up to 10 Recommend we will try to use a medication that does heart rate and blood pressure  Tachycardia Likely sinus tachycardia, worse in the morning when getting up and moving helping everybody get ready for the day Recommend she take the beta-blocker  at nighttime so it is in her system first thing in the morning May need Bystolic 5 twice daily or 10 at night Heart rate should get better with physical activity, recommended she walk the dog Continue gentle weight loss  Morbid obesity Plan as above, regular walking program, dietary changes  Anxiety Prior episodes of tightness in the setting of anxiety, some family situations Feels it is under control, she helps support everybody else    Total encounter time more than 60 minutes  Greater than 50% was spent in counseling and coordination of care with the patient  Patient seen in consultation for Jolene Cannady to be referred back to her office for ongoing care of the issues detailed above   Signed, Esmond Plants, M.D., Ph.D. [place], [place]

## 2021-11-09 LAB — CYTOLOGY - PAP
Adequacy: ABSENT
Comment: NEGATIVE
Diagnosis: NEGATIVE
High risk HPV: NEGATIVE

## 2021-11-09 NOTE — Progress Notes (Signed)
Contacted via MyChart   Good afternoon Amy [name], your pap returned negative on cytology and HPV testing.  Great news.  No need to repeat for 5 years.

## 2021-11-10 ENCOUNTER — Telehealth: Payer: Managed Care, Other (non HMO) | Admitting: Physician Assistant

## 2021-11-10 DIAGNOSIS — U071 COVID-19: Secondary | ICD-10-CM

## 2021-11-10 MED ORDER — MOLNUPIRAVIR EUA 200MG CAPSULE: 4.0000 | ORAL_CAPSULE | Freq: Two times a day (BID) | ORAL | 0 refills | Status: AC

## 2021-11-10 MED ORDER — BENZONATATE 100 MG PO CAPS
100.0000 mg | ORAL_CAPSULE | Freq: Three times a day (TID) | ORAL | 0 refills | Status: DC | PRN
Start: 2021-11-10 — End: 2021-11-19

## 2021-11-10 MED ORDER — FLUTICASONE PROPIONATE 50 MCG/ACT NA SUSP: 2.0000 | Freq: Every day | NASAL | 0 refills | Status: DC

## 2021-11-10 NOTE — Patient Instructions (Signed)
Tangi Dawn Cunning, thank you for joining Jennifer M Burnette, PA-C for today's virtual visit.  While this provider is not your primary care provider (PCP), if your PCP is located in our provider database this encounter information will be shared with them immediately following your visit.  Consent: (Patient) Lizann Dawn Linnemann provided verbal consent for this virtual visit at the beginning of the encounter.  Current Medications:  Current Outpatient Medications:    benzonatate (TESSALON) 100 MG capsule, Take 1 capsule (100 mg total) by mouth 3 (three) times daily as needed., Disp: 30 capsule, Rfl: 0   fluticasone (FLONASE) 50 MCG/ACT nasal spray, Place 2 sprays into both nostrils daily., Disp: 16 g, Rfl: 0   molnupiravir EUA (LAGEVRIO) 200 mg CAPS capsule, Take 4 capsules (800 mg total) by mouth 2 (two) times daily for 5 days., Disp: 40 capsule, Rfl: 0   Blood Glucose Monitoring Suppl (ONETOUCH VERIO) w/Device KIT, Use to check blood sugar once a day, first thing in morning fasting, with goal blood sugar < 130., Disp: 1 kit, Rfl: 0   clobetasol ointment (TEMOVATE) 0.05 %, Apply 1 application topically 2 (two) times daily., Disp: 45 g, Rfl: 1   glucose blood (ONETOUCH VERIO) test strip, Use to check blood sugar once daily, fasting., Disp: 100 each, Rfl: 12   Lancets (ONETOUCH ULTRASOFT) lancets, Use to check blood sugar once daily, fasting., Disp: 100 each, Rfl: 12   levocetirizine (XYZAL) 5 MG tablet, Take 1 tablet (5 mg total) by mouth daily., Disp: 90 tablet, Rfl: 3   levothyroxine (SYNTHROID) 50 MCG tablet, Take 1 tablet (50 mcg total) by mouth daily., Disp: 90 tablet, Rfl: 4   losartan (COZAAR) 25 MG tablet, Take 2 tablets (50 mg total) by mouth daily., Disp: 90 tablet, Rfl: 3   metFORMIN (GLUCOPHAGE) 500 MG tablet, Start out taking 1 tablet (500 MG) by mouth daily at bedtime., Disp: 90 tablet, Rfl: 3   Multiple Vitamin (MULTIVITAMINS PO), Take 1 tablet by mouth daily. , Disp: , Rfl:     nebivolol (BYSTOLIC) 5 MG tablet, Take 1 tablet (5 mg total) by mouth daily. May take an extra 5 mg as needed for tachycardia, Disp: 100 tablet, Rfl: 3   rosuvastatin (CRESTOR) 10 MG tablet, Take 1 tablet (10 mg total) by mouth daily., Disp: 90 tablet, Rfl: 4   Vitamin D, Ergocalciferol, (DRISDOL) 1.25 MG (50000 UNIT) CAPS capsule, Take 1 capsule by mouth once a week, Disp: 12 capsule, Rfl: 3   Medications ordered in this encounter:  Meds ordered this encounter  Medications   molnupiravir EUA (LAGEVRIO) 200 mg CAPS capsule    Sig: Take 4 capsules (800 mg total) by mouth 2 (two) times daily for 5 days.    Dispense:  40 capsule    Refill:  0    Order Specific Question:   Supervising Provider    Answer:   MILLER, BRIAN [[id number]]   benzonatate (TESSALON) 100 MG capsule    Sig: Take 1 capsule (100 mg total) by mouth 3 (three) times daily as needed.    Dispense:  30 capsule    Refill:  0    Order Specific Question:   Supervising Provider    Answer:   MILLER, BRIAN [[id number]]   fluticasone (FLONASE) 50 MCG/ACT nasal spray    Sig: Place 2 sprays into both nostrils daily.    Dispense:  16 g    Refill:  0    Order Specific Question:   Supervising Provider      Answer:   MILLER, [place]     *If you need refills on other medications prior to your next appointment, please contact your pharmacy*  Follow-Up: Call back or seek an in-person evaluation if the symptoms worsen or if the condition fails to improve as anticipated.  Other Instructions Molnupiravir Oral Capsules What is this medication? MOLNUPIRAVIR (mol nue pir a vir) treats COVID-19. It is an antiviral medication. It may decrease the risk of developing severe symptoms of COVID-19. It may also decrease the chance of going to the hospital. This medication is not approved by the FDA. The FDA has authorized emergency use of this medication during the COVID-19 pandemic. This medicine may be used for other purposes; ask your health care provider  or pharmacist if you have questions. COMMON BRAND NAME(S): LAGEVRIO What should I tell my care team before I take this medication? They need to know if you have any of these conditions: Any allergies Any serious illness An unusual or allergic reaction to molnupiravir, other medications, foods, dyes, or preservatives Pregnant or trying to get pregnant Breast-feeding How should I use this medication? Take this medication by mouth with water. Take it as directed on the prescription label at the same time every day. Do not cut, crush or chew this medication. Swallow the capsules whole. You can take it with or without food. If it upsets your stomach, take it with food. Take all of this medication unless your care team tells you to stop it early. Keep taking it even if you think you are better. Talk to your care team about the use of this medication in children. Special care may be needed. Overdosage: If you think you have taken too much of this medicine contact a poison control center or emergency room at once. NOTE: This medicine is only for you. Do not share this medicine with others. What if I miss a dose? If you miss a dose, take it as soon as you can unless it is more than 10 hours late. If it is more than 10 hours late, skip the missed dose. Take the next dose at the normal time. Do not take extra or 2 doses at the same time to make up for the missed dose. What may interact with this medication? Interactions have not been studied. This list may not describe all possible interactions. Give your health care provider a list of all the medicines, herbs, non-prescription drugs, or dietary supplements you use. Also tell them if you smoke, drink alcohol, or use illegal drugs. Some items may interact with your medicine. What should I watch for while using this medication? Your condition will be monitored carefully while you are receiving this medication. Visit your care team for regular checkups. Tell  your care team if your symptoms do not start to get better or if they get worse. Do not become pregnant while taking this medication. You may need a pregnancy test before starting this medication. Women must use a reliable form of birth control while taking this medication and for 4 days after stopping the medication. Women should inform their care team if they wish to become pregnant or think they might be pregnant. Men should not father a child while taking this medication and for 3 months after stopping it. There is potential for serious harm to an unborn child. Talk to your care team for more information. Do not breast-feed an infant while taking this medication and for 4 days after stopping the medication. What side  effects may I notice from receiving this medication? Side effects that you should report to your care team as soon as possible: Allergic reactions--skin rash, itching, hives, swelling of the face, lips, tongue, or throat Side effects that usually do not require medical attention (report these to your care team if they continue or are bothersome): Diarrhea Dizziness Nausea This list may not describe all possible side effects. Call your doctor for medical advice about side effects. You may report side effects to FDA at 1-800-FDA-1088. Where should I keep my medication? Keep out of the reach of children and pets. Store at room temperature between 20 and 25 degrees C (68 and 77 degrees F). Get rid of any unused medication after the expiration date. To get rid of medications that are no longer needed or have expired: Take the medication to a medication take-back program. Check with your pharmacy or law enforcement to find a location. If you cannot return the medication, check the label or package insert to see if the medication should be thrown out in the garbage or flushed down the toilet. If you are not sure, ask your care team. If it is safe to put it in the trash, take the medication  out of the container. Mix the medication with cat litter, dirt, coffee grounds, or other unwanted substance. Seal the mixture in a bag or container. Put it in the trash. NOTE: This sheet is a summary. It may not cover all possible information. If you have questions about this medicine, talk to your doctor, pharmacist, or health care provider.  2022 Elsevier/Gold Standard (2020-12-08 00:00:00)   10 Things You Can Do to Manage Your COVID-19 Symptoms at Home If you have possible or confirmed COVID-19 Stay home except to get medical care. Monitor your symptoms carefully. If your symptoms get worse, call your healthcare provider immediately. Get rest and stay hydrated. If you have a medical appointment, call the healthcare provider ahead of time and tell them that you have or may have COVID-19. For medical emergencies, call 911 and notify the dispatch personnel that you have or may have COVID-19. Cover your cough and sneezes with a tissue or use the inside of your elbow. Wash your hands often with soap and water for at least 20 seconds or clean your hands with an alcohol-based hand sanitizer that contains at least 60% alcohol. As much as possible, stay in a specific room and away from other people in your home. Also, you should use a separate bathroom, if available. If you need to be around other people in or outside of the home, wear a mask. Avoid sharing personal items with other people in your household, like dishes, towels, and bedding. Clean all surfaces that are touched often, like counters, tabletops, and doorknobs. Use household cleaning sprays or wipes according to the label instructions. cdc.gov/coronavirus 06/27/2020 This information is not intended to replace advice given to you by your health care provider. Make sure you discuss any questions you have with your health care provider. Document Revised: 08/21/2021 Document Reviewed: 08/21/2021 Elsevier Patient Education  2022 Elsevier  Inc.    If you have been instructed to have an in-person evaluation today at a local Urgent Care facility, please use the link below. It will take you to a list of all of our available [facility], including address, phone number and hours of operation. Please do not delay care.  [facility]  If you or a family member do not have   a primary care provider, use the link below to schedule a visit and establish care. When you choose a [facility] primary care physician or advanced practice provider, you gain a long-term partner in health. Find a Primary Care Provider  Learn more about [facility]'s in-office and virtual care options: [facility] - Get Care Now 

## 2021-11-10 NOTE — Progress Notes (Signed)
Virtual Visit Consent   Amy [name], you are scheduled for a virtual visit with a [facility] provider today.     Just as with appointments in the office, your consent must be obtained to participate.  Your consent will be active for this visit and any virtual visit you may have with one of our providers in the next 365 days.     If you have a MyChart account, a copy of this consent can be sent to you electronically.  All virtual visits are billed to your insurance company just like a traditional visit in the office.    As this is a virtual visit, video technology does not allow for your provider to perform a traditional examination.  This may limit your provider's ability to fully assess your condition.  If your provider identifies any concerns that need to be evaluated in person or the need to arrange testing (such as labs, EKG, etc.), we will make arrangements to do so.     Although advances in technology are sophisticated, we cannot ensure that it will always work on either your end or our end.  If the connection with a video visit is poor, the visit may have to be switched to a telephone visit.  With either a video or telephone visit, we are not always able to ensure that we have a secure connection.     I need to obtain your verbal consent now.   Are you willing to proceed with your visit today?    Amy [name] has provided verbal consent on 11/10/2021 for a virtual visit (video or telephone).   Mar Daring, PA-C   Date: 11/10/2021 10:22 AM   Virtual Visit via Video Note   I, Mar Daring, connected with  Amy [name]  ([id number], [birth year]) on 11/10/21 at 10:15 AM EST by a video-enabled telemedicine application and verified that I am speaking with the correct person using two identifiers.  Location: Patient: Virtual Visit Location Patient: Home Provider: Home office   I discussed the limitations of evaluation and management by telemedicine and  the availability of in person appointments. The patient expressed understanding and agreed to proceed.    History of Present Illness: Amy [name] is a 45 y.o. who identifies as a female who was assigned female at birth, and is being seen today for Covid 18.  HPI: URI  This is a new problem. Episode onset: symptoms started last night; tested positive for covid 19 this morning. The problem has been gradually worsening. Associated symptoms include congestion, coughing (dry), headaches, a plugged ear sensation (right ear), rhinorrhea, sinus pain and a sore throat. Pertinent negatives include no diarrhea, ear pain, nausea, vomiting or wheezing. She has tried acetaminophen, sleep and increased fluids for the symptoms. The treatment provided mild relief.     Problems:  Patient Active Problem List   Diagnosis Date Noted   Folliculitis 09/38/1829   Tachycardia 10/28/2021   Anxiety 10/02/2021   Perimenopause 09/10/2021   Hypertension associated with diabetes ([place]) 07/23/2021   Snoring 06/24/2021   Hypothyroid 09/25/2020   Vitamin D deficiency 04/29/2020   Type 2 diabetes mellitus with morbid obesity ([place]) 04/28/2020   Hyperlipidemia associated with type 2 diabetes mellitus ([place]) 04/28/2020   Psoriasis    Morbid obesity ([place])    Plantar fascial fibromatosis 09/26/2013    Allergies:  Allergies  Allergen Reactions   Biaxin [Clarithromycin] Nausea And Vomiting   Medications:  Current Outpatient Medications:  benzonatate (TESSALON) 100 MG capsule, Take 1 capsule (100 mg total) by mouth 3 (three) times daily as needed., Disp: 30 capsule, Rfl: 0   fluticasone (FLONASE) 50 MCG/ACT nasal spray, Place 2 sprays into both nostrils daily., Disp: 16 g, Rfl: 0   molnupiravir EUA (LAGEVRIO) 200 mg CAPS capsule, Take 4 capsules (800 mg total) by mouth 2 (two) times daily for 5 days., Disp: 40 capsule, Rfl: 0   Blood Glucose Monitoring Suppl (ONETOUCH VERIO) w/Device KIT, Use to check blood sugar  once a day, first thing in morning fasting, with goal blood sugar < 130., Disp: 1 kit, Rfl: 0   clobetasol ointment (TEMOVATE) 9.62 %, Apply 1 application topically 2 (two) times daily., Disp: 45 g, Rfl: 1   glucose blood (ONETOUCH VERIO) test strip, Use to check blood sugar once daily, fasting., Disp: 100 each, Rfl: 12   Lancets (ONETOUCH ULTRASOFT) lancets, Use to check blood sugar once daily, fasting., Disp: 100 each, Rfl: 12   levocetirizine (XYZAL) 5 MG tablet, Take 1 tablet (5 mg total) by mouth daily., Disp: 90 tablet, Rfl: 3   levothyroxine (SYNTHROID) 50 MCG tablet, Take 1 tablet (50 mcg total) by mouth daily., Disp: 90 tablet, Rfl: 4   losartan (COZAAR) 25 MG tablet, Take 2 tablets (50 mg total) by mouth daily., Disp: 90 tablet, Rfl: 3   metFORMIN (GLUCOPHAGE) 500 MG tablet, Start out taking 1 tablet (500 MG) by mouth daily at bedtime., Disp: 90 tablet, Rfl: 3   Multiple Vitamin (MULTIVITAMINS PO), Take 1 tablet by mouth daily. , Disp: , Rfl:    nebivolol (BYSTOLIC) 5 MG tablet, Take 1 tablet (5 mg total) by mouth daily. May take an extra 5 mg as needed for tachycardia, Disp: 100 tablet, Rfl: 3   rosuvastatin (CRESTOR) 10 MG tablet, Take 1 tablet (10 mg total) by mouth daily., Disp: 90 tablet, Rfl: 4   Vitamin D, Ergocalciferol, (DRISDOL) 1.25 MG (50000 UNIT) CAPS capsule, Take 1 capsule by mouth once a week, Disp: 12 capsule, Rfl: 3  Observations/Objective: Patient is well-developed, well-nourished in no acute distress.  Resting comfortably at home.  Head is normocephalic, atraumatic.  No labored breathing.  Speech is clear and coherent with logical content.  Patient is alert and oriented at baseline.    Assessment and Plan: 1. COVID-19 - molnupiravir EUA (LAGEVRIO) 200 mg CAPS capsule; Take 4 capsules (800 mg total) by mouth 2 (two) times daily for 5 days.  Dispense: 40 capsule; Refill: 0 - benzonatate (TESSALON) 100 MG capsule; Take 1 capsule (100 mg total) by mouth 3 (three)  times daily as needed.  Dispense: 30 capsule; Refill: 0 - fluticasone (FLONASE) 50 MCG/ACT nasal spray; Place 2 sprays into both nostrils daily.  Dispense: 16 g; Refill: 0  - Continue OTC symptomatic management of choice - Will send OTC vitamins and supplement information through [facility] - Molnupiravir prescribed - Tessalon perles for cough, fluticasone for nasal congestion and headache - Patient enrolled in MyChart symptom monitoring - Push fluids - Rest as needed - Discussed return precautions and when to seek in-person evaluation, sent via [facility] as well  Follow Up Instructions: I discussed the assessment and treatment plan with the patient. The patient was provided an opportunity to ask questions and all were answered. The patient agreed with the plan and demonstrated an understanding of the instructions.  A copy of instructions were sent to the patient via MyChart unless otherwise noted below.    The patient was advised to call  back or seek an in-person evaluation if the symptoms worsen or if the condition fails to improve as anticipated.  Time:  I spent 12 minutes with the patient via telehealth technology discussing the above problems/concerns.    Mar Daring, PA-C

## 2021-11-17 ENCOUNTER — Telehealth: Payer: Managed Care, Other (non HMO) | Admitting: Family Medicine

## 2021-11-17 DIAGNOSIS — R300 Dysuria: Secondary | ICD-10-CM | POA: Diagnosis not present

## 2021-11-17 MED ORDER — NITROFURANTOIN MONOHYD MACRO 100 MG PO CAPS
100.0000 mg | ORAL_CAPSULE | Freq: Two times a day (BID) | ORAL | 0 refills | Status: AC
Start: 2021-11-17 — End: 2021-11-22

## 2021-11-17 NOTE — Patient Instructions (Signed)
I appreciate the opportunity to provide you with care for your health and wellness.  Take all the medication as ordered- take each dose with a full glass of water  Remember to go to the bathroom after intercourse to prevent bacteria from moving into body  Hope you feel better soon!  Please continue to practice social distancing to keep you, your family, and our community safe.  If you must go out, please wear a mask and practice good handwashing.   Have a wonderful day. With [place], Cherly Beach, DNP, AGNP-BC  Urinary Tract Infection, Adult A urinary tract infection (UTI) is an infection of any part of the urinary tract. The urinary tract includes: The kidneys. The ureters. The bladder. The urethra. These organs make, store, and get rid of pee (urine) in the body. What are the causes? This infection is caused by germs (bacteria) in your genital area. These germs grow and cause swelling (inflammation) of your urinary tract. What increases the risk? The following factors may make you more likely to develop this condition: Using a small, thin tube (catheter) to drain pee. Not being able to control when you pee or poop (incontinence). Being female. If you are female, these things can increase the risk: Using these methods to prevent pregnancy: A medicine that kills sperm (spermicide). A device that blocks sperm (diaphragm). Having low levels of a female hormone (estrogen). Being pregnant. You are more likely to develop this condition if: You have genes that add to your risk. You are sexually active. You take antibiotic medicines. You have trouble peeing because of: A prostate that is bigger than normal, if you are female. A blockage in the part of your body that drains pee from the bladder. A kidney stone. A nerve condition that affects your bladder. Not getting enough to drink. Not peeing often enough. You have other conditions, such as: Diabetes. A weak disease-fighting  system (immune system). Sickle cell disease. Gout. Injury of the spine. What are the signs or symptoms? Symptoms of this condition include: Needing to pee right away. Peeing small amounts often. Pain or burning when peeing. Blood in the pee. Pee that smells bad or not like normal. Trouble peeing. Pee that is cloudy. Fluid coming from the vagina, if you are female. Pain in the belly or lower back. Other symptoms include: Vomiting. Not feeling hungry. Feeling mixed up (confused). This may be the first symptom in older adults. Being tired and grouchy (irritable). A fever. Watery poop (diarrhea). How is this treated? Taking antibiotic medicine. Taking other medicines. Drinking enough water. In some cases, you may need to see a specialist. Follow these instructions at home: Medicines Take over-the-counter and prescription medicines only as told by your doctor. If you were prescribed an antibiotic medicine, take it as told by your doctor. Do not stop taking it even if you start to feel better. General instructions Make sure you: Pee until your bladder is empty. Do not hold pee for a long time. Empty your bladder after sex. Wipe from front to back after peeing or pooping if you are a female. Use each tissue one time when you wipe. Drink enough fluid to keep your pee pale yellow. Keep all follow-up visits. Contact a doctor if: You do not get better after 1-2 days. Your symptoms go away and then come back. Get help right away if: You have very bad back pain. You have very bad pain in your lower belly. You have a fever. You have chills. You  feeling like you will vomit or you vomit. Summary A urinary tract infection (UTI) is an infection of any part of the urinary tract. This condition is caused by germs in your genital area. There are many risk factors for a UTI. Treatment includes antibiotic medicines. Drink enough fluid to keep your pee pale yellow. This information is  not intended to replace advice given to you by your health care provider. Make sure you discuss any questions you have with your health care provider. Document Revised: 07/11/2020 Document Reviewed: 07/11/2020 Elsevier Patient Education  [place].

## 2021-11-17 NOTE — Progress Notes (Signed)
Virtual Visit Consent   Amy [name], you are scheduled for a virtual visit with a [facility] provider today.     Just as with appointments in the office, your consent must be obtained to participate.  Your consent will be active for this visit and any virtual visit you may have with one of our providers in the next 365 days.     If you have a MyChart account, a copy of this consent can be sent to you electronically.  All virtual visits are billed to your insurance company just like a traditional visit in the office.    As this is a virtual visit, video technology does not allow for your provider to perform a traditional examination.  This may limit your provider's ability to fully assess your condition.  If your provider identifies any concerns that need to be evaluated in person or the need to arrange testing (such as labs, EKG, etc.), we will make arrangements to do so.     Although advances in technology are sophisticated, we cannot ensure that it will always work on either your end or our end.  If the connection with a video visit is poor, the visit may have to be switched to a telephone visit.  With either a video or telephone visit, we are not always able to ensure that we have a secure connection.     I need to obtain your verbal consent now.   Are you willing to proceed with your visit today?    Amy [name] has provided verbal consent on 11/17/2021 for a virtual visit (video or telephone).   Perlie Mayo, NP   Date: 11/17/2021 9:40 AM   Virtual Visit via Video Note   I, Perlie Mayo, connected with  Amy [name]  ([id number], 1944/02/07) on 11/17/21 at  9:50 AM EST by a video-enabled telemedicine application and verified that I am speaking with the correct person using two identifiers.  Location: Patient: Virtual Visit Location Patient: Home Provider: Virtual Visit Location Provider: Home Office   I discussed the limitations of evaluation and management by  telemedicine and the availability of in person appointments. The patient expressed understanding and agreed to proceed.    History of Present Illness: Amy [name] is a 45 y.o. who identifies as a female who was assigned female at birth, and is being seen today for UTI symptoms HPI: Urinary Tract Infection  This is a new problem. The current episode started yesterday. The problem occurs every urination. The problem has been gradually worsening. The quality of the pain is described as aching (back pain). The pain is at a severity of 2/10. The pain is mild. There has been no fever. She is Sexually active. There is A history of pyelonephritis. Associated symptoms include frequency, hesitancy and urgency. Pertinent negatives include no chills, flank pain, hematuria, nausea, possible pregnancy, sweats or vomiting. She has tried nothing for the symptoms. Her past medical history is significant for kidney stones. There is no history of recurrent UTIs.      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[place]) 04/28/2020   Hyperlipidemia associated with type 2 diabetes mellitus ([place]) 04/28/2020  Psoriasis    Morbid obesity ([place])    Plantar fascial fibromatosis 09/26/2013    Allergies:  Allergies  Allergen Reactions   Biaxin [Clarithromycin] Nausea And Vomiting   Medications:  Current Outpatient Medications:    benzonatate (TESSALON) 100 MG capsule, Take 1 capsule (100 mg total) by mouth 3 (three) times daily as needed., Disp: 30 capsule, Rfl: 0   Blood Glucose Monitoring Suppl (ONETOUCH VERIO) w/Device KIT, Use to check blood sugar once a day, first thing in morning fasting, with goal blood  sugar < 130., Disp: 1 kit, Rfl: 0   clobetasol ointment (TEMOVATE) 6.30 %, Apply 1 application topically 2 (two) times daily., Disp: 45 g, Rfl: 1   fluticasone (FLONASE) 50 MCG/ACT nasal spray, Place 2 sprays into both nostrils daily., Disp: 16 g, Rfl: 0   glucose blood (ONETOUCH VERIO) test strip, Use to check blood sugar once daily, fasting., Disp: 100 each, Rfl: 12   Lancets (ONETOUCH ULTRASOFT) lancets, Use to check blood sugar once daily, fasting., Disp: 100 each, Rfl: 12   levocetirizine (XYZAL) 5 MG tablet, Take 1 tablet (5 mg total) by mouth daily., Disp: 90 tablet, Rfl: 3   levothyroxine (SYNTHROID) 50 MCG tablet, Take 1 tablet (50 mcg total) by mouth daily., Disp: 90 tablet, Rfl: 4   losartan (COZAAR) 25 MG tablet, Take 2 tablets (50 mg total) by mouth daily., Disp: 90 tablet, Rfl: 3   metFORMIN (GLUCOPHAGE) 500 MG tablet, Start out taking 1 tablet (500 MG) by mouth daily at bedtime., Disp: 90 tablet, Rfl: 3   Multiple Vitamin (MULTIVITAMINS PO), Take 1 tablet by mouth daily. , Disp: , Rfl:    nebivolol (BYSTOLIC) 5 MG tablet, Take 1 tablet (5 mg total) by mouth daily. May take an extra 5 mg as needed for tachycardia, Disp: 100 tablet, Rfl: 3   rosuvastatin (CRESTOR) 10 MG tablet, Take 1 tablet (10 mg total) by mouth daily., Disp: 90 tablet, Rfl: 4   Vitamin D, Ergocalciferol, (DRISDOL) 1.25 MG (50000 UNIT) CAPS capsule, Take 1 capsule by mouth once a week, Disp: 12 capsule, Rfl: 3  Observations/Objective: Patient is well-developed, well-nourished in no acute distress.  Resting comfortably  at home.  Head is normocephalic, atraumatic.  No labored breathing.  Speech is clear and coherent with logical content.  Patient is alert and oriented at baseline.    Assessment and Plan: 1. Dysuria  - nitrofurantoin, macrocrystal-monohydrate, (MACROBID) 100 MG capsule; Take 1 capsule (100 mg total) by mouth 2 (two) times daily for 5 days.  Dispense: 10 capsule; Refill: 0  -S&S consistent  with UTI starting  -hygiene reviewed -advised f/u with pcp if not improved   Reviewed side effects, risks and benefits of medication.    Patient acknowledged agreement and understanding of the plan.   I discussed the assessment and treatment plan with the patient. The patient was provided an opportunity to ask questions and all were answered. The patient agreed with the plan and demonstrated an understanding of the instructions.   The patient was advised to call back or seek an in-person evaluation if the symptoms worsen or if the condition fails to improve as anticipated.   The above assessment and management plan was discussed with the patient. The patient verbalized understanding of and has agreed to the management plan. Patient is aware to call the clinic if symptoms persist or worsen. Patient is aware when to return to the clinic for a follow-up visit. Patient educated on when it is appropriate to  go to the emergency department.   Follow Up Instructions: I discussed the assessment and treatment plan with the patient. The patient was provided an opportunity to ask questions and all were answered. The patient agreed with the plan and demonstrated an understanding of the instructions.  A copy of instructions were sent to the patient via MyChart unless otherwise noted below.     The patient was advised to call back or seek an in-person evaluation if the symptoms worsen or if the condition fails to improve as anticipated.  Time:  I spent 10 minutes with the patient via telehealth technology discussing the above problems/concerns.    Perlie Mayo, NP

## 2021-11-19 ENCOUNTER — Encounter: Payer: Self-pay | Admitting: Nurse Practitioner

## 2021-11-19 ENCOUNTER — Other Ambulatory Visit: Payer: Self-pay

## 2021-11-19 ENCOUNTER — Ambulatory Visit: Payer: Managed Care, Other (non HMO) | Admitting: Nurse Practitioner

## 2021-11-19 VITALS — BP 109/65 | HR 69 | Temp 98.3°F | Wt 314.6 lb

## 2021-11-19 DIAGNOSIS — N3001 Acute cystitis with hematuria: Secondary | ICD-10-CM

## 2021-11-19 DIAGNOSIS — R300 Dysuria: Secondary | ICD-10-CM | POA: Diagnosis not present

## 2021-11-19 LAB — URINALYSIS, ROUTINE W REFLEX MICROSCOPIC
Bilirubin, UA: NEGATIVE
Glucose, UA: NEGATIVE
Nitrite, UA: NEGATIVE
Protein,UA: NEGATIVE
Specific Gravity, UA: <1.005 — ABNORMAL LOW (ref 1.005–1.030)
Urobilinogen, Ur: 0.2 mg/dL (ref 0.2–1.0)
pH, UA: 6 (ref 5.0–7.5)

## 2021-11-19 LAB — MICROSCOPIC EXAMINATION
Bacteria, UA: NONE SEEN
Epithelial Cells (non renal): NONE SEEN /hpf (ref 0–10)

## 2021-11-19 MED ORDER — CIPROFLOXACIN HCL 500 MG PO TABS: 500.0000 mg | ORAL_TABLET | Freq: Two times a day (BID) | ORAL | 0 refills | Status: AC

## 2021-11-19 NOTE — Patient Instructions (Signed)
Start azo, you can get this over the counter Stop macrobid, start cipro twice a day for 7 days

## 2021-11-19 NOTE — Progress Notes (Signed)
Acute Office Visit  Subjective:    Patient ID: Amy [name], female    DOB: [birth year], 45 y.o.   MRN: [id number]  Chief Complaint  Patient presents with   Urinary Tract Infection    Pt states she has been having low back pain, pain with urination, and urinary frequency since Monday     HPI Patient is in today for dysuria. She had a virtual visit on 11/17/21 and was prescribed macrobid and her symptoms have not improved.   URINARY SYMPTOMS  Dysuria: burning Urinary frequency: yes Urgency: yes Small volume voids: no Symptom severity:  moderate Urinary incontinence: yes Foul odor: no Hematuria: no Abdominal pain: no Back pain: yes Suprapubic pain/pressure: no Flank pain: no Fever:  no Vomiting: no Relief with cranberry juice:  n/a Relief with pyridium: n/a Status: worse Previous urinary tract infection: yes Recurrent urinary tract infection: no Treatments attempted: increasing fluids, macrobid    Past Medical History:  Diagnosis Date   Allergy    Asthma    Diabetes mellitus without complication ([place])    Obesity    Psoriasis     Past Surgical History:  Procedure Laterality Date   CESAREAN SECTION     X 2   HAND SURGERY Right    THUMB   HERNIA REPAIR     right thumb surgery      Family History  Problem Relation Age of Onset   Hypertension Mother    Thyroid disease Mother    Autoimmune disease Mother    Anemia Mother    Cirrhosis Father    Glaucoma Father    Thyroid disease Maternal Grandmother    Osteoporosis Maternal Grandmother    Hypertension Maternal Grandmother    Diabetes Maternal Grandmother    Congestive Heart Failure Paternal Grandmother    Appendicitis Paternal Grandfather    Asthma Daughter    Allergies Daughter    ADD / ADHD Son    Sleep apnea Neg Hx     Social History   Socioeconomic History   Marital status: Married    Spouse name: Not on file   Number of children: Not on file   Years of education: Not on file    Highest education level: Not on file  Occupational History   Not on file  Tobacco Use   Smoking status: Former    Packs/day: 0.50    Years: 5.00    Pack years: 2.50    Types: Cigarettes    Quit date: 01/04/2005    Years since quitting: 16.8   Smokeless tobacco: Never  Vaping Use   Vaping Use: Never used  Substance and Sexual Activity   Alcohol use: No    Alcohol/week: 0.0 standard drinks   Drug use: No   Sexual activity: Not on file  Other Topics Concern   Not on file  Social History Narrative   Not on file   Social Determinants of Health   Financial Resource Strain: Not on file  Food Insecurity: Not on file  Transportation Needs: Not on file  Physical Activity: Not on file  Stress: Not on file  Social Connections: Not on file  Intimate Partner Violence: Not on file    Outpatient Medications Prior to Visit  Medication Sig Dispense Refill   Blood Glucose Monitoring Suppl (ONETOUCH VERIO) w/Device KIT Use to check blood sugar once a day, first thing in morning fasting, with goal blood sugar < 130. 1 kit 0   glucose blood (ONETOUCH VERIO) test strip  Use to check blood sugar once daily, fasting. 100 each 12   Lancets (ONETOUCH ULTRASOFT) lancets Use to check blood sugar once daily, fasting. 100 each 12   levocetirizine (XYZAL) 5 MG tablet Take 1 tablet (5 mg total) by mouth daily. 90 tablet 3   levothyroxine (SYNTHROID) 50 MCG tablet Take 1 tablet (50 mcg total) by mouth daily. 90 tablet 4   losartan (COZAAR) 25 MG tablet Take 2 tablets (50 mg total) by mouth daily. (Patient taking differently: Take 25 mg by mouth daily.) 90 tablet 3   metFORMIN (GLUCOPHAGE) 500 MG tablet Start out taking 1 tablet (500 MG) by mouth daily at bedtime. 90 tablet 3   Multiple Vitamin (MULTIVITAMINS PO) Take 1 tablet by mouth daily.      nebivolol (BYSTOLIC) 5 MG tablet Take 1 tablet (5 mg total) by mouth daily. May take an extra 5 mg as needed for tachycardia 100 tablet 3   nitrofurantoin,  macrocrystal-monohydrate, (MACROBID) 100 MG capsule Take 1 capsule (100 mg total) by mouth 2 (two) times daily for 5 days. 10 capsule 0   rosuvastatin (CRESTOR) 10 MG tablet Take 1 tablet (10 mg total) by mouth daily. 90 tablet 4   Vitamin D, Ergocalciferol, (DRISDOL) 1.25 MG (50000 UNIT) CAPS capsule Take 1 capsule by mouth once a week 12 capsule 3   benzonatate (TESSALON) 100 MG capsule Take 1 capsule (100 mg total) by mouth 3 (three) times daily as needed. 30 capsule 0   clobetasol ointment (TEMOVATE) 2.58 % Apply 1 application topically 2 (two) times daily. 45 g 1   fluticasone (FLONASE) 50 MCG/ACT nasal spray Place 2 sprays into both nostrils daily. 16 g 0   No facility-administered medications prior to visit.    Allergies  Allergen Reactions   Biaxin [Clarithromycin] Nausea And Vomiting    Review of Systems  Constitutional:  Positive for fatigue.  Respiratory: Negative.    Cardiovascular: Negative.   Gastrointestinal: Negative.   Genitourinary:  Positive for dysuria, flank pain and urgency. Negative for hematuria.  Musculoskeletal:  Positive for back pain.  Skin: Negative.   Neurological: Negative.       Objective:    Physical Exam Vitals and nursing note reviewed.  Constitutional:      General: She is not in acute distress.    Appearance: Normal appearance. She is obese.  HENT:     Head: Normocephalic.  Eyes:     Conjunctiva/sclera: Conjunctivae normal.  Cardiovascular:     Rate and Rhythm: Normal rate and regular rhythm.     Pulses: Normal pulses.     Heart sounds: Normal heart sounds.  Pulmonary:     Effort: Pulmonary effort is normal.     Breath sounds: Normal breath sounds.  Abdominal:     Palpations: Abdomen is soft.     Tenderness: There is no abdominal tenderness. There is no right CVA tenderness.  Musculoskeletal:     Cervical back: Normal range of motion.  Skin:    General: Skin is warm.  Neurological:     General: No focal deficit present.      Mental Status: She is alert and oriented to person, place, and time.  Psychiatric:        Mood and Affect: Mood normal.        Behavior: Behavior normal.        Thought Content: Thought content normal.        Judgment: Judgment normal.    BP 109/65   Pulse 69  Temp 98.3 F (36.8 C) (Oral)   Wt (!) 314 lb 9.6 oz (142.7 kg)   SpO2 97%   BMI 57.54 kg/m  Wt Readings from Last 3 Encounters:  11/19/21 (!) 314 lb 9.6 oz (142.7 kg)  11/03/21 (!) 317 lb 4 oz (143.9 kg)  11/02/21 (!) 315 lb 12.8 oz (143.2 kg)    Health Maintenance Due  Topic Date Due   COLONOSCOPY (Pts 45-43yr Insurance coverage will need to be confirmed)  Never done    There are no preventive care reminders to display for this patient.   Lab Results  Component Value Date   TSH 2.150 07/16/2021   Lab Results  Component Value Date   WBC 10.2 07/16/2021   HGB 14.1 07/16/2021   HCT 43.5 07/16/2021   MCV 88 07/16/2021   PLT 196 07/16/2021   Lab Results  Component Value Date   NA CANCELED 08/20/2021   K CANCELED 08/20/2021   CO2 CANCELED 08/20/2021   GLUCOSE CANCELED 08/20/2021   BUN CANCELED 08/20/2021   CREATININE CANCELED 08/20/2021   BILITOT 0.7 07/16/2021   ALKPHOS 98 07/16/2021   AST 34 07/16/2021   ALT 14 07/16/2021   PROT 7.3 07/16/2021   ALBUMIN 3.9 07/16/2021   CALCIUM CANCELED 08/20/2021   EGFR 111 07/16/2021   Lab Results  Component Value Date   CHOL 170 06/24/2021   Lab Results  Component Value Date   HDL 44 06/24/2021   Lab Results  Component Value Date   LDLCALC 111 (H) 06/24/2021   Lab Results  Component Value Date   TRIG 77 06/24/2021   Lab Results  Component Value Date   CHOLHDL 3.5 09/05/2017   Lab Results  Component Value Date   HGBA1C 5.7 (H) 10/02/2021       Assessment & Plan:   Problem List Items Addressed This Visit   None Visit Diagnoses     Dysuria    -  Primary   U/A postive for 1+ leukocytes and trace blood. Recently started macrobid. With  ongoing symtpoms/back pain change to cipro and add urine culture.    Relevant Orders   Urinalysis, Routine w reflex microscopic (Completed)   Microscopic Examination (Completed)   Acute cystitis with hematuria       U/A postive for 1+ leukocytes and trace blood. Recently started macrobid. With ongoing symtpoms/back pain change to cipro and add urine culture.    Relevant Orders   Urine Culture        Meds ordered this encounter  Medications   ciprofloxacin (CIPRO) 500 MG tablet    Sig: Take 1 tablet (500 mg total) by mouth 2 (two) times daily for 7 days.    Dispense:  14 tablet    Refill:  0      LCharyl Dancer NP

## 2021-11-23 ENCOUNTER — Ambulatory Visit: Payer: Self-pay | Admitting: *Deleted

## 2021-11-23 LAB — URINE CULTURE

## 2021-11-23 LAB — HM DIABETES EYE EXAM

## 2021-11-23 NOTE — Telephone Encounter (Signed)
Reason for Disposition  All other patients with painful urination (Exception: [1] EITHER frequency or urgency AND [2] has on-call doctor)    Seen on Thursday and started on antibiotics for UTI  Answer Assessment - Initial Assessment Questions 1. SEVERITY: "How bad is the pain?"  (e.g., Scale 1-10; mild, moderate, or severe)   - MILD (1-3): complains slightly about urination hurting   - MODERATE (4-7): interferes with normal activities     - SEVERE (8-10): excruciating, unwilling or unable to urinate because of the pain      Pt calling in c/o continued pain with urination.   I was up all night long.  AZO is not helping.   I have 2-3 days left of the antibiotic.   "I feel like I'm peeing glass".   2. FREQUENCY: "How many times have you had painful urination today?"      Yes every hour. 3. PATTERN: "Is pain present every time you urinate or just sometimes?"      Yes 4. ONSET: "When did the painful urination start?"      Seen last Thursday for UTI started on antibiotics.   5. FEVER: "Do you have a fever?" If Yes, ask: "What is your temperature, how was it measured, and when did it start?"     Doesn't feel like it. 6. PAST UTI: "Have you had a urine infection before?" If Yes, ask: "When was the last time?" and "What happened that time?"      Not asked  AZO changes the color so I can't tell if I have blood in my urine or not. 7. CAUSE: "What do you think is causing the painful urination?"  (e.g., UTI, scratch, Herpes sore)     Continued UTI 8. OTHER SYMPTOMS: "Do you have any other symptoms?" (e.g., flank pain, vaginal discharge, genital sores, urgency, blood in urine)     Frequency, burning.   Not improved on the antibiotics 9. PREGNANCY: "Is there any chance you are pregnant?" "When was your last menstrual period?"     Not asked  Protocols used: Urination Pain - Female-A-AH

## 2021-11-23 NOTE — Telephone Encounter (Signed)
  Chief Complaint: Continued burning with urination.   Seen last Thursday by Amy [name] prescribed antibiotics. Symptoms: "I feel like I'm peeing glass".   Frequency Frequency: Yes up all night every hour urinating Pertinent Negatives: Patient denies Blood in urine but is taking AZO so it's hard to tell Disposition: [] ED /[] Urgent Care (no appt availability in office) / [] Appointment(In office/virtual)/ []  [facility]/ [] Home Care/ [] Refused Recommended Disposition  Additional Notes: Since you were seen last Thursday and are not doing better I will send a message to Amy Guarneri, NP letting her know of your continued symptoms.   She saw Amy [name] but prefers to talk with Amy [name] since she is her regular provider.  Someone will be contacting you once the office opens.

## 2021-11-23 NOTE — Telephone Encounter (Signed)
Reached out to patient via MyChart regarding Jolene's recommendations.

## 2021-11-24 ENCOUNTER — Encounter: Payer: Self-pay | Admitting: Nurse Practitioner

## 2021-11-24 ENCOUNTER — Ambulatory Visit: Payer: Managed Care, Other (non HMO) | Admitting: Nurse Practitioner

## 2021-11-24 ENCOUNTER — Other Ambulatory Visit: Payer: Self-pay

## 2021-11-24 VITALS — BP 103/61 | HR 69 | Wt 314.0 lb

## 2021-11-24 DIAGNOSIS — R300 Dysuria: Secondary | ICD-10-CM | POA: Diagnosis not present

## 2021-11-24 LAB — MICROSCOPIC EXAMINATION: Bacteria, UA: NONE SEEN

## 2021-11-24 LAB — URINALYSIS, ROUTINE W REFLEX MICROSCOPIC: Specific Gravity, UA: 1.025 (ref 1.005–1.030)

## 2021-11-24 LAB — WET PREP FOR TRICH, YEAST, CLUE
Clue Cell Exam: NEGATIVE
Trichomonas Exam: NEGATIVE
Yeast Exam: NEGATIVE

## 2021-11-24 MED ORDER — DOXYCYCLINE HYCLATE 100 MG PO TABS: 100.0000 mg | ORAL_TABLET | Freq: Two times a day (BID) | ORAL | 0 refills | Status: AC

## 2021-11-24 NOTE — Patient Instructions (Signed)
Urethritis, Adult Urethritis is a swelling (inflammation) of the urethra. The urethra is the tube that drains urine from the bladder. It is important to get treatment for this condition early. Delayed treatment may lead to complications, such as an infection in the urinary tract (ureters, kidneys, and bladder). What are the causes? This condition may be caused by: Germs that are spread through sexual contact. This is the leading cause of urethritis. This may include bacterial or viral infections. Injury to the urethra. This can happen after a thin, flexible tube (catheter) is inserted into the urethra to drain urine, or after medical instruments or foreign bodies are inserted into the area. Chemical irritation. This may include contact with spermicide or prolonged contact with chemicals in bubble bath, shampoo, or perfumed soaps. A disease that causes inflammation. This is rare. What increases the risk? The following factors may make you more likely to develop this condition: Having sex without using a condom. Having multiple sexual partners. Having poor hygiene. What are the signs or symptoms? Symptoms of this condition include: Pain with urination. Frequent urination. An urgent need to urinate. Itching and pain in the vagina or penis. Discharge or bleeding coming from the penis. Most women have no symptoms. How is this diagnosed? This condition may be diagnosed based on: Your symptoms. Your medical history. A physical exam. Tests may also be done. These may include: Urine tests. Swabs from the urethra. How is this treated? Treatment for this condition depends on the cause. Urethritis caused by a bacterial infection is treated with antibiotic medicine. Sexual partners must also be treated. Follow these instructions at home: Medicines Take over-the-counter and prescription medicines only as told by your health care provider. If you were prescribed an antibiotic, take it as told by  your health care provider. Do not stop taking the antibiotic even if you start to feel better. Lifestyle Avoid using perfumed soaps, bubble bath, and shampoo when you bathe or shower. Rinse the vaginal area after bathing. Wear cotton underwear. Not wearing underwear when going to sleep can help. Make sure to wipe from front to back after using the toilet if you are female. Do not have sex until your health care provider approves. When you do have sex, be sure to practice safe sex. Any sexual partners you have had in the past 60 days should be treated. General instructions Drink enough fluid to keep your urine pale yellow. It is up to you to get your test results. Ask your health care provider, or the department that is doing the test, when your results will be ready. Keep all follow-up visits. This is important. Get tested again 3 months after treatment to make sure the infection is gone. It is important that your sexual partner also gets tested again. Contact a health care provider if: Your symptoms have not improved after 3 days. Your symptoms get worse. You have eye redness or pain. You develop abdominal pain or pelvic pain (in females). You develop joint pain or a rash. You have a fever or chills. Get help right away if: You have severe pain in the belly, back, or side. You vomit repeatedly. Summary Urethritis is a swelling (inflammation) of the urethra. Germs that are spread through sexual contact are the most common cause of this condition. It is important to get treatment for this condition early. Delayed treatment may lead to complications. Treatment for this condition depends on the cause. Any sexual partners must also be treated. This information is not intended  to replace advice given to you by your health care provider. Make sure you discuss any questions you have with your health care provider. Document Revised: 07/06/2020 Document Reviewed: 07/06/2020 Elsevier Patient  Education  2022 [employer].

## 2021-11-24 NOTE — Telephone Encounter (Signed)
Pt is scheduled for today at 1 pm

## 2021-11-24 NOTE — Assessment & Plan Note (Signed)
Acute and ongoing == wet prep negative.  UA noting nitrites and ketones.  Will send for culture.  ?more urethritis.  Will change to Doxycycline and plan for follow-up in one week.  Possible she passed kidney stone and irritation present to urethra, has history of these.  Recommend increased fluid intake at home and Azo for discomfort.

## 2021-11-24 NOTE — Progress Notes (Signed)
BP 103/61    Pulse 69    Wt (!) 314 lb (142.4 kg)    SpO2 97%    BMI 57.43 kg/m    Subjective:    Patient ID: Amy [name], female    DOB: August 07, 1976, 45 y.o.   MRN: [id number]  HPI: Amy [name] is a 45 y.o. female  Chief Complaint  Patient presents with   Urinary Symptoms    Patient states it just hurts down there and she states has 1 more day of antibiotics left (Cipro).   URINARY SYMPTOMS Has been having pain every time she urinates since 11/17/21.  Has been treated with Macrobid and Cipro, last treatment was Cipro on 11/19/21.  Has been taking Azo with no benefit.  When uses the bathroom feels like she is peeing glass. Dysuria: yes Urinary frequency: yes Urgency: yes Small volume voids: no Symptom severity: yes Urinary incontinence:  occasional dribbling Foul odor: no Hematuria: no Abdominal pain: no Back pain: yes Suprapubic pain/pressure: no Flank pain: no Fever:  no Vomiting: no Relief with pyridium: no Status: about the same Previous urinary tract infection: yes Recurrent urinary tract infection: no Sexual activity: monogamous History of sexually transmitted disease: no Treatments attempted: antibiotics, pyridium, and increasing fluids    Relevant past medical, surgical, family and social history reviewed and updated as indicated. Interim medical history since our last visit reviewed. Allergies and medications reviewed and updated.  Review of Systems  Constitutional:  Positive for fatigue.  Respiratory: Negative.    Cardiovascular: Negative.   Gastrointestinal: Negative.   Genitourinary:  Positive for dysuria and urgency. Negative for decreased urine volume, flank pain, hematuria and vaginal discharge.  Musculoskeletal:  Positive for back pain.  Neurological: Negative.   Psychiatric/Behavioral: Negative.     Per HPI unless specifically indicated above     Objective:    BP 103/61    Pulse 69    Wt (!) 314 lb (142.4 kg)    SpO2 97%    BMI  57.43 kg/m   Wt Readings from Last 3 Encounters:  11/24/21 (!) 314 lb (142.4 kg)  11/19/21 (!) 314 lb 9.6 oz (142.7 kg)  11/03/21 (!) 317 lb 4 oz (143.9 kg)    Physical Exam Vitals and nursing note reviewed.  Constitutional:      General: She is awake. She is not in acute distress.    Appearance: She is well-developed and well-groomed. She is morbidly obese. She is not ill-appearing.  HENT:     Head: Normocephalic.     Right Ear: Hearing normal.     Left Ear: Hearing normal.  Eyes:     General: Lids are normal.        Right eye: No discharge.        Left eye: No discharge.     Conjunctiva/sclera: Conjunctivae normal.     Pupils: Pupils are equal, round, and reactive to light.  Neck:     Thyroid: No thyromegaly.     Vascular: No carotid bruit.  Cardiovascular:     Rate and Rhythm: Normal rate and regular rhythm.     Heart sounds: Normal heart sounds. No murmur heard.   No gallop.  Pulmonary:     Effort: Pulmonary effort is normal. No accessory muscle usage or respiratory distress.     Breath sounds: Normal breath sounds.  Abdominal:     General: Bowel sounds are normal.     Palpations: Abdomen is soft.  Musculoskeletal:  Cervical back: Normal range of motion and neck supple.     Right lower leg: No edema.     Left lower leg: No edema.  Skin:    General: Skin is warm and dry.  Neurological:     Mental Status: She is alert and oriented to person, place, and time.  Psychiatric:        Attention and Perception: Attention normal.        Mood and Affect: Mood normal.        Speech: Speech normal.        Behavior: Behavior normal. Behavior is cooperative.        Thought Content: Thought content normal.   Results for orders placed or performed in visit on 11/19/21  Urine Culture   Specimen: Urine   UR  Result Value Ref Range   Urine Culture, Routine Final report    Organism ID, Bacteria Comment   Microscopic Examination   Urine  Result Value Ref Range   WBC, UA  0-5 0 - 5 /hpf   RBC 0-2 0 - 2 /hpf   Epithelial Cells (non renal) None seen 0 - 10 /hpf   Bacteria, UA None seen None seen/Few  Urinalysis, Routine w reflex microscopic  Result Value Ref Range   Specific Gravity, UA <1.005 (L) 1.005 - 1.030   pH, UA 6.0 5.0 - 7.5   Color, UA Yellow Yellow   Appearance Ur Clear Clear   Leukocytes,UA 1+ (A) Negative   Protein,UA Negative Negative/Trace   Glucose, UA Negative Negative   Ketones, UA Trace (A) Negative   RBC, UA Trace (A) Negative   Bilirubin, UA Negative Negative   Urobilinogen, Ur 0.2 0.2 - 1.0 mg/dL   Nitrite, UA Negative Negative   Microscopic Examination See below:       Assessment & Plan:   Problem List Items Addressed This Visit       Other   Dysuria - Primary    Acute and ongoing == wet prep negative.  UA noting nitrites and ketones.  Will send for culture.  ?more urethritis.  Will change to Doxycycline and plan for follow-up in one week.  Possible she passed kidney stone and irritation present to urethra, has history of these.  Recommend increased fluid intake at home and Azo for discomfort.       Relevant Orders   WET PREP FOR [place], YEAST, CLUE   Urinalysis, Routine w reflex microscopic   Urine Culture     Follow up plan: Return in about 1 week (around 12/01/2021) for UTI.

## 2021-11-28 LAB — URINE CULTURE: Organism ID, Bacteria: NO GROWTH

## 2021-11-29 NOTE — Progress Notes (Signed)
Contacted via MyChart   Good morning Amy [name], your urine returned continuing to show no infection.  How are you feeling? Keep being awesome!!  Thank you for allowing me to participate in your care.  I appreciate you. Kindest regards, Dailyn Reith

## 2021-12-01 ENCOUNTER — Ambulatory Visit: Payer: Managed Care, Other (non HMO) | Admitting: Nurse Practitioner

## 2021-12-01 ENCOUNTER — Other Ambulatory Visit: Payer: Self-pay

## 2021-12-01 ENCOUNTER — Encounter: Payer: Self-pay | Admitting: Nurse Practitioner

## 2021-12-01 DIAGNOSIS — R300 Dysuria: Secondary | ICD-10-CM | POA: Diagnosis not present

## 2021-12-01 NOTE — Patient Instructions (Signed)
Flank Pain, Adult ?Flank pain is pain in your side. The flank is the area on your side between your upper belly (abdomen) and your spine. The pain may occur over a short time (acute), or it may be long-term or come back often (chronic). It may be mild or very bad. Pain in this area can be caused by many different things. ?Follow these instructions at home: ? ?Drink enough fluid to keep your pee (urine) pale yellow. ?Rest as told by your doctor. ?Take over-the-counter and prescription medicines only as told by your doctor. ?Keep a journal to keep track of: ?What has caused your flank pain. ?What has made your flank pain feel better. ?Keep all follow-up visits. ?Contact a doctor if: ?Medicine does not help your pain. ?You have new symptoms. ?Your pain gets worse. ?Your symptoms last longer than 2-3 days. ?You have trouble peeing. ?You are peeing more often than normal. ?Get help right away if: ?You have trouble breathing. ?You are short of breath. ?Your belly hurts, or it is swollen or red. ?You feel like you may vomit (nauseous). ?You vomit. ?You feel faint, or you faint. ?You have blood in your pee. ?You have flank pain and a fever. ?These symptoms may be an emergency. Get help right away. Call your local emergency services (911 in the [place] ?Do not wait to see if the symptoms will go away. ?Do not drive yourself to the hospital. ?Summary ?Flank pain is pain in your side. The flank is the area of your side between your upper belly (abdomen) and your spine. ?Flank pain may occur over a short time (acute), or it may be long-term or come back often (chronic). It may be mild or very bad. ?Pain in this area can be caused by many different things. ?Contact your doctor if your symptoms get worse or last longer than 2-3 days. ?This information is not intended to replace advice given to you by your health care provider. Make sure you discuss any questions you have with your health care provider. ?Document Revised:  02/09/2021 Document Reviewed: 02/09/2021 ?Elsevier Patient Education ? 2022 [employer] ? ?

## 2021-12-01 NOTE — Progress Notes (Signed)
BP 105/66    Pulse 69    Ht 5\' 2"  (1.575 m)    Wt (!) 314 lb (142.4 kg)    SpO2 99%    BMI 57.43 kg/m    Subjective:    Patient ID: Amy [name], female    DOB: [birth year], 45 y.o.   MRN: [id number]  HPI: Amy [name] is a 45 y.o. female  Chief Complaint  Patient presents with   Urinary Tract Infection    Patient states she thinks she may have had a kidney stone. Patient states she use the restroom Thursday night and she felt better afterwards.   URINARY SYMPTOMS Follow-up for urethritis -- treated with Doxycycline on 11/24/21.  Has been having pain every time she urinates since 11/17/21.  Has been treated with Macrobid and Cipro, last treatment was Cipro on 11/19/21.  Has been taking Azo with no benefit.    At this time feeling better with improved symptoms.  On Thursday night she urinated and symptoms were improved after this.  She believes she had kidney stone, has had these before. Dysuria: none Urinary frequency: none Urgency: none Small volume voids: no Symptom severity: none Urinary incontinence: none Foul odor: no Hematuria: no Abdominal pain: no Back pain: none Suprapubic pain/pressure: no Flank pain: no Fever:  no Vomiting: no Relief with pyridium: no Status: about the same Previous urinary tract infection: yes Recurrent urinary tract infection: no Sexual activity: monogamous History of sexually transmitted disease: no Treatments attempted: antibiotics, pyridium, and increasing fluids    Relevant past medical, surgical, family and social history reviewed and updated as indicated. Interim medical history since our last visit reviewed. Allergies and medications reviewed and updated.  Review of Systems  Constitutional:  Positive for fatigue.  Respiratory: Negative.    Cardiovascular: Negative.   Gastrointestinal: Negative.   Genitourinary:  Positive for dysuria and urgency. Negative for decreased urine volume, flank pain, hematuria and vaginal  discharge.  Musculoskeletal:  Positive for back pain.  Neurological: Negative.   Psychiatric/Behavioral: Negative.     Per HPI unless specifically indicated above     Objective:    BP 105/66    Pulse 69    Ht 5\' 2"  (1.575 m)    Wt (!) 314 lb (142.4 kg)    SpO2 99%    BMI 57.43 kg/m   Wt Readings from Last 3 Encounters:  12/01/21 (!) 314 lb (142.4 kg)  11/24/21 (!) 314 lb (142.4 kg)  11/19/21 (!) 314 lb 9.6 oz (142.7 kg)    Physical Exam Vitals and nursing note reviewed.  Constitutional:      General: She is awake. She is not in acute distress.    Appearance: She is well-developed and well-groomed. She is morbidly obese. She is not ill-appearing.  HENT:     Head: Normocephalic.     Right Ear: Hearing normal.     Left Ear: Hearing normal.  Eyes:     General: Lids are normal.        Right eye: No discharge.        Left eye: No discharge.     Conjunctiva/sclera: Conjunctivae normal.     Pupils: Pupils are equal, round, and reactive to light.  Neck:     Thyroid: No thyromegaly.     Vascular: No carotid bruit.  Cardiovascular:     Rate and Rhythm: Normal rate and regular rhythm.     Heart sounds: Normal heart sounds. No murmur heard.   No gallop.  Pulmonary:     Effort: Pulmonary effort is normal. No accessory muscle usage or respiratory distress.     Breath sounds: Normal breath sounds.  Abdominal:     General: Bowel sounds are normal. There is no distension.     Palpations: Abdomen is soft.     Tenderness: There is no abdominal tenderness. There is no right CVA tenderness or left CVA tenderness.  Musculoskeletal:     Cervical back: Normal range of motion and neck supple.     Right lower leg: No edema.     Left lower leg: No edema.  Skin:    General: Skin is warm and dry.  Neurological:     Mental Status: She is alert and oriented to person, place, and time.  Psychiatric:        Attention and Perception: Attention normal.        Mood and Affect: Mood normal.         Speech: Speech normal.        Behavior: Behavior normal. Behavior is cooperative.        Thought Content: Thought content normal.   Results for orders placed or performed in visit on 11/24/21  WET PREP FOR [place], YEAST, CLUE   Specimen: Sterile Swab   Sterile Swab  Result Value Ref Range   Trichomonas Exam Negative Negative   Yeast Exam Negative Negative   Clue Cell Exam Negative Negative  Urine Culture   Specimen: Urine   UR  Result Value Ref Range   Urine Culture, Routine Final report    Organism ID, Bacteria No growth   Microscopic Examination  Result Value Ref Range   WBC, UA 0-5 0 - 5 /hpf   RBC 3-10 (A) 0 - 2 /hpf   Epithelial Cells (non renal) 0-10 0 - 10 /hpf   Crystals Present (A) N/A   Crystal Type Calcium Oxalate N/A   Mucus, UA Present (A) Not Estab.   Bacteria, UA None seen None seen/Few  Urinalysis, Routine w reflex microscopic  Result Value Ref Range   Specific Gravity, UA 1.025 1.005 - 1.030   pH, UA CANCELED    Color, UA Orange Yellow   Appearance Ur Clear Clear   Protein,UA CANCELED    Glucose, UA CANCELED    Ketones, UA CANCELED    Microscopic Examination See below:       Assessment & Plan:   Problem List Items Addressed This Visit       Other   Dysuria    Improved at this time with no further symptoms.  Possible kidney stone.  Return to office as needed for worsening or ongoing.      Morbid obesity ([place]) - Primary    BMI 57.43, lost weight recently.  Praised for this.  Recommended eating smaller high protein, low fat meals more frequently and exercising 30 mins a day 5 times a week with a goal of 10-15lb weight loss in the next 3 months. Patient voiced their understanding and motivation to adhere to these recommendations.         Follow up plan: Return for return 12/29/21 as scheduled.

## 2021-12-01 NOTE — Assessment & Plan Note (Signed)
BMI 57.43, lost weight recently.  Praised for this.  Recommended eating smaller high protein, low fat meals more frequently and exercising 30 mins a day 5 times a week with a goal of 10-15lb weight loss in the next 3 months. Patient voiced their understanding and motivation to adhere to these recommendations.

## 2021-12-01 NOTE — Assessment & Plan Note (Signed)
Improved at this time with no further symptoms.  Possible kidney stone.  Return to office as needed for worsening or ongoing.

## 2021-12-29 ENCOUNTER — Ambulatory Visit: Payer: Managed Care, Other (non HMO) | Admitting: Nurse Practitioner

## 2022-01-09 DIAGNOSIS — I479 Paroxysmal tachycardia, unspecified: Secondary | ICD-10-CM | POA: Insufficient documentation

## 2022-01-14 ENCOUNTER — Encounter: Payer: Self-pay | Admitting: Nurse Practitioner

## 2022-01-14 ENCOUNTER — Ambulatory Visit: Payer: Managed Care, Other (non HMO) | Admitting: Nurse Practitioner

## 2022-01-14 ENCOUNTER — Other Ambulatory Visit: Payer: Self-pay

## 2022-01-14 DIAGNOSIS — E1169 Type 2 diabetes mellitus with other specified complication: Secondary | ICD-10-CM

## 2022-01-14 DIAGNOSIS — I479 Paroxysmal tachycardia, unspecified: Secondary | ICD-10-CM | POA: Diagnosis not present

## 2022-01-14 DIAGNOSIS — F419 Anxiety disorder, unspecified: Secondary | ICD-10-CM

## 2022-01-14 DIAGNOSIS — E559 Vitamin D deficiency, unspecified: Secondary | ICD-10-CM

## 2022-01-14 DIAGNOSIS — E1159 Type 2 diabetes mellitus with other circulatory complications: Secondary | ICD-10-CM | POA: Diagnosis not present

## 2022-01-14 DIAGNOSIS — E538 Deficiency of other specified B group vitamins: Secondary | ICD-10-CM

## 2022-01-14 DIAGNOSIS — E785 Hyperlipidemia, unspecified: Secondary | ICD-10-CM

## 2022-01-14 DIAGNOSIS — E039 Hypothyroidism, unspecified: Secondary | ICD-10-CM

## 2022-01-14 DIAGNOSIS — I152 Hypertension secondary to endocrine disorders: Secondary | ICD-10-CM

## 2022-01-14 LAB — MICROALBUMIN, URINE WAIVED
Creatinine, Urine Waived: 200 mg/dL (ref 10–300)
Microalb, Ur Waived: 30 mg/L — ABNORMAL HIGH (ref 0–19)
Microalb/Creat Ratio: <30 mg/g (ref <30)

## 2022-01-14 LAB — BAYER DCA HB A1C WAIVED: HB A1C (BAYER DCA - WAIVED): 5.6 % (ref 4.8–5.6)

## 2022-01-14 MED ORDER — LOSARTAN POTASSIUM 25 MG PO TABS: 25.0000 mg | ORAL_TABLET | Freq: Every day | ORAL | 4 refills | Status: DC

## 2022-01-14 MED ORDER — VITAMIN D (ERGOCALCIFEROL) 1.25 MG (50000 UNIT) PO CAPS: 50000.0000 [IU] | ORAL_CAPSULE | ORAL | 4 refills | Status: DC

## 2022-01-14 NOTE — Assessment & Plan Note (Signed)
Improved at this time, had reaction to Lexapro which she feels elevated BP.  Will remain without medication at this time as scores are improved.  Could consider Zoloft in future if needed or Buspar.  Denies SI/HI.

## 2022-01-14 NOTE — Assessment & Plan Note (Signed)
Chronic, stable.  Continue Rosuvastatin daily and adjust as needed.  Lipid panel today.

## 2022-01-14 NOTE — Assessment & Plan Note (Addendum)
Diagnosed in October 2021 -- A1c 5.6% at this time and urine ALB 30.  Continue current medication regimen and adjust as needed + continue diet focus.  Continue to monitor BS daily.  Consider trial off medication if remains stable next visit.  Return in 4 months.

## 2022-01-14 NOTE — Assessment & Plan Note (Signed)
Stable at this time, continue Bystolic as ordered by Dr. Rockey Situ, cardiology, is tolerating well.  Will not increase at this time, although per cardiology note can consider increase to 5 MG BID or 10 MG nightly if needed in future.  Overall her HR is remaining stable at this time with no symptoms.

## 2022-01-14 NOTE — Assessment & Plan Note (Signed)
Chronic, ongoing.  BP stable in office today.  Continue Losartan 50 MG daily and Bystolic 5 MG to take at night. Tolerating medications. Recommend she monitor BP at least a few mornings a week at home and document.  DASH diet at home. Labs: CBC, CMP, TSH, Lipid.  Return in 4 months.

## 2022-01-14 NOTE — Progress Notes (Addendum)
BP 98/65    Pulse 68    Temp 98.4 F (36.9 C) (Oral)    Wt (!) 317 lb 6.4 oz (144 kg)    SpO2 98%    BMI 58.05 kg/m    Subjective:    Patient ID: Amy [name], female    DOB: 06-Apr-1976, 46 y.o.   MRN: [id number]  HPI: Amy [name] is a 47 y.o. female  Chief Complaint  Patient presents with   Hyperlipidemia   Hypertension   Diabetes   Tachycardia    Patient states her heart rate has been elevated this week and she says that the highest she has seen was 109.    DIABETES Taking Metformin 500 MG at night.  A1c 5.7%. Hypoglycemic episodes:no Polydipsia/polyuria: no Visual disturbance: no Chest pain: no Paresthesias: no Glucose Monitoring: yes  Accucheck frequency: Daily  Fasting glucose: 100-115  Post prandial:  Evening:  Before meals: Taking Insulin?: no  Long acting insulin:  Short acting insulin: Blood Pressure Monitoring: daily Retinal Examination: Up to Date -- Dr. Gloriann Loan in December Foot Exam: Up to Date Pneumovax: Up to Date Influenza: Up to Date Aspirin: no   HYPERTENSION / HYPERLIPIDEMIA Taking Losartan 25 MG daily and Crestor 10 MG daily.  Plus taking Bystolic 5 MG daily started by Dr. Rockey Situ with cardiology due to tachycardia -- last saw 11/03/21 -- with recommendation to increase to 5 MG BID or 10 MG at night as needed.    She reports when she stands up her heart rate will trend to 100 range but returns down to 80, when walking around house is in the 80 range and when sitting 60 range. Satisfied with current treatment? yes Duration of hypertension: chronic BP monitoring frequency: daily BP range: 90 to 100/60-70 BP medication side effects: no Past BP meds: as above Duration of hyperlipidemia: chronic Cholesterol medication side effects: no Cholesterol supplements: none Past cholesterol medications: none Medication compliance: good compliance Aspirin: no Recent stressors: no Recurrent headaches: no Visual changes: no Palpitations:  no Dyspnea: no Chest pain: no Lower extremity edema: no Dizzy/lightheaded: no   HYPOTHYROIDISM Continues on Levothyroxine 50 MCG.  Continues on Vitamin D weekly due to history of low levels.   Thyroid control status:stable Satisfied with current treatment? yes Medication side effects: no Medication compliance: good compliance Etiology of hypothyroidism:  Recent dose adjustment:no Fatigue: no Cold intolerance: no Heat intolerance: no Weight gain: no Weight loss: no Constipation: no Diarrhea/loose stools: no Palpitations: no Lower extremity edema: no Anxiety/depressed mood: no   Depression screen [facility] 2/9 01/14/2022 10/28/2021 10/02/2021 09/10/2021 12/23/2020  Decreased Interest 0 1 0 2 0  Down, Depressed, Hopeless 0 0 0 0 0  PHQ - 2 Score 0 1 0 2 0  Altered sleeping 0 0 2 2 0  Tired, decreased energy 0 1 2 3  0  Change in appetite 0 0 1 1 0  Feeling bad or failure about yourself  0 0 0 0 0  Trouble concentrating 0 1 1 2  0  Moving slowly or fidgety/restless 0 0 0 0 0  Suicidal thoughts 0 0 0 0 0  PHQ-9 Score 0 3 6 10  0  Difficult doing work/chores Not difficult at all Not difficult at all Somewhat difficult Somewhat difficult -    GAD 7 : Generalized Anxiety Score 01/14/2022 10/28/2021 10/02/2021 09/10/2021  Nervous, Anxious, on Edge 0 0 2 3  Control/stop worrying 0 0 0 1  Worry too much - different things 0 0 1  0  Trouble relaxing 0 1 2 3   Restless 0 0 2 2  Easily annoyed or irritable 0 1 1 2   Afraid - awful might happen 0 0 0 0  Total GAD 7 Score 0 2 8 11   Anxiety Difficulty Not difficult at all Not difficult at all Somewhat difficult Somewhat difficult    Relevant past medical, surgical, family and social history reviewed and updated as indicated. Interim medical history since our last visit reviewed. Allergies and medications reviewed and updated.  Review of Systems  Constitutional: Negative.   Respiratory: Negative.    Cardiovascular: Negative.   Gastrointestinal:  Negative.   Neurological: Negative.   Psychiatric/Behavioral: Negative.     Per HPI unless specifically indicated above     Objective:    BP 98/65    Pulse 68    Temp 98.4 F (36.9 C) (Oral)    Wt (!) 317 lb 6.4 oz (144 kg)    SpO2 98%    BMI 58.05 kg/m   Wt Readings from Last 3 Encounters:  01/14/22 (!) 317 lb 6.4 oz (144 kg)  12/01/21 (!) 314 lb (142.4 kg)  11/24/21 (!) 314 lb (142.4 kg)    Physical Exam Vitals and nursing note reviewed.  Constitutional:      General: She is awake. She is not in acute distress.    Appearance: She is well-developed and well-groomed. She is morbidly obese. She is not ill-appearing.  HENT:     Head: Normocephalic.     Right Ear: Hearing normal.     Left Ear: Hearing normal.  Eyes:     General: Lids are normal.        Right eye: No discharge.        Left eye: No discharge.     Conjunctiva/sclera: Conjunctivae normal.     Pupils: Pupils are equal, round, and reactive to light.  Neck:     Thyroid: No thyromegaly.     Vascular: No carotid bruit.  Cardiovascular:     Rate and Rhythm: Normal rate and regular rhythm.     Heart sounds: Normal heart sounds. No murmur heard.   No gallop.  Pulmonary:     Effort: Pulmonary effort is normal. No accessory muscle usage or respiratory distress.     Breath sounds: Normal breath sounds.  Abdominal:     General: Bowel sounds are normal.     Palpations: Abdomen is soft.  Musculoskeletal:     Cervical back: Normal range of motion and neck supple.     Right lower leg: No edema.     Left lower leg: No edema.  Skin:    General: Skin is warm and dry.  Neurological:     Mental Status: She is alert and oriented to person, place, and time.  Psychiatric:        Attention and Perception: Attention normal.        Mood and Affect: Mood normal.        Speech: Speech normal.        Behavior: Behavior normal. Behavior is cooperative.        Thought Content: Thought content normal.   Diabetic Foot Exam -  Simple   Simple Foot Form Visual Inspection No deformities, no ulcerations, no other skin breakdown bilaterally: Yes Sensation Testing Intact to touch and monofilament testing bilaterally: Yes Pulse Check Posterior Tibialis and Dorsalis pulse intact bilaterally: Yes Comments     Results for orders placed or performed in visit on 01/14/22  Bayer DCA Hb A1c Waived  Result Value Ref Range   HB A1C (BAYER DCA - WAIVED) 5.6 4.8 - 5.6 %  Microalbumin, Urine Waived  Result Value Ref Range   Microalb, Ur Waived 30 (H) 0 - 19 mg/L   Creatinine, Urine Waived 200 10 - 300 mg/dL   Microalb/Creat Ratio <30 <30 mg/g      Assessment & Plan:   Problem List Items Addressed This Visit       Cardiovascular and Mediastinum   Hypertension associated with diabetes (HCC)    Chronic, ongoing.  BP stable in office today.  Continue Losartan 50 MG daily and Bystolic 5 MG to take at night. Tolerating medications. Recommend she monitor BP at least a few mornings a week at home and document.  DASH diet at home. Labs: CBC, CMP, TSH, Lipid.  Return in 4 months.      Relevant Medications   losartan (COZAAR) 25 MG tablet   Other Relevant Orders   Bayer DCA Hb A1c Waived (Completed)   Microalbumin, Urine Waived (Completed)   CBC with Differential/Platelet   Comprehensive metabolic panel   Paroxysmal tachycardia (HCC)    Stable at this time, continue Bystolic as ordered by Dr. Rockey Situ, cardiology, is tolerating well.  Will not increase at this time, although per cardiology note can consider increase to 5 MG BID or 10 MG nightly if needed in future.  Overall her HR is remaining stable at this time with no symptoms.      Relevant Medications   losartan (COZAAR) 25 MG tablet   Other Relevant Orders   Comprehensive metabolic panel   Lipid Panel w/o Chol/HDL Ratio     Endocrine   Hyperlipidemia associated with type 2 diabetes mellitus (HCC)    Chronic, stable.  Continue Rosuvastatin daily and adjust as  needed.  Lipid panel today.      Relevant Medications   losartan (COZAAR) 25 MG tablet   Other Relevant Orders   Bayer DCA Hb A1c Waived (Completed)   Comprehensive metabolic panel   Lipid Panel w/o Chol/HDL Ratio   Hypothyroid    Chronic, stable.  Continue current medication regimen and adjust as needed. Thyroid labs today.      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  Continue current medication regimen and adjust as needed + continue diet focus.  Continue to monitor BS daily.  Consider trial off medication if remains stable next visit.  Return in 4 months.      Relevant Medications   losartan (COZAAR) 25 MG tablet   Other Relevant Orders   Bayer DCA Hb A1c Waived (Completed)   Microalbumin, Urine Waived (Completed)     Other   Anxiety    Improved at this time, had reaction to Lexapro which she feels elevated BP.  Will remain without medication at this time as scores are improved.  Could consider Zoloft in future if needed or Buspar.  Denies SI/HI.      Morbid obesity (HCC)    BMI 58.05 with T2DM, HTN/HLD.  Recommended eating smaller high protein, low fat meals more frequently and exercising 30 mins a day 5 times a week with a goal of 10-15lb weight loss in the next 3 months. Patient voiced their understanding and motivation to adhere to these recommendations.  Vitamin D deficiency    Ongoing, continue weekly supplement and recheck level today.  Could consider changing to daily Vitamin D3 if level improving.      Other Visit Diagnoses     B12 deficiency       History of low levels, recheck today and start supplement as needed.   Relevant Orders   Vitamin B12        Follow up plan: Return in about 4 months (around 05/26/2022) for T2DM, HTN/HLD, THYROID.

## 2022-01-14 NOTE — Assessment & Plan Note (Signed)
Ongoing, continue weekly supplement and recheck level today.  Could consider changing to daily Vitamin D3 if level improving. 

## 2022-01-14 NOTE — Assessment & Plan Note (Signed)
Chronic, stable.  Continue current medication regimen and adjust as needed.  Thyroid labs today. 

## 2022-01-14 NOTE — Patient Instructions (Signed)

## 2022-01-14 NOTE — Assessment & Plan Note (Signed)
BMI 58.05 with T2DM, HTN/HLD.  Recommended eating smaller high protein, low fat meals more frequently and exercising 30 mins a day 5 times a week with a goal of 10-15lb weight loss in the next 3 months. Patient voiced their understanding and motivation to adhere to these recommendations.

## 2022-01-15 LAB — T4, FREE: Free T4: 1.32 ng/dL (ref 0.82–1.77)

## 2022-01-15 LAB — CBC WITH DIFFERENTIAL/PLATELET
Basophils Absolute: 0.1 10*3/uL (ref 0.0–0.2)
Basos: 1 %
EOS (ABSOLUTE): 0.3 10*3/uL (ref 0.0–0.4)
Eos: 3 %
Hematocrit: 39.6 % (ref 34.0–46.6)
Hemoglobin: 12.7 g/dL (ref 11.1–15.9)
Immature Grans (Abs): 0 10*3/uL (ref 0.0–0.1)
Immature Granulocytes: 0 %
Lymphocytes Absolute: 2.6 10*3/uL (ref 0.7–3.1)
Lymphs: 26 %
MCH: 28.3 pg (ref 26.6–33.0)
MCHC: 32.1 g/dL (ref 31.5–35.7)
MCV: 88 fL (ref 79–97)
Monocytes Absolute: 0.6 10*3/uL (ref 0.1–0.9)
Monocytes: 6 %
Neutrophils Absolute: 6.4 10*3/uL (ref 1.4–7.0)
Neutrophils: 64 %
Platelets: 220 10*3/uL (ref 150–450)
RBC: 4.48 x10E6/uL (ref 3.77–5.28)
RDW: 12.9 % (ref 11.7–15.4)
WBC: 10.1 10*3/uL (ref 3.4–10.8)

## 2022-01-15 LAB — COMPREHENSIVE METABOLIC PANEL
ALT: 6 IU/L (ref 0–32)
AST: 16 IU/L (ref 0–40)
Albumin/Globulin Ratio: 1.2 (ref 1.2–2.2)
Albumin: 3.7 g/dL — ABNORMAL LOW (ref 3.8–4.8)
Alkaline Phosphatase: 93 IU/L (ref 44–121)
BUN/Creatinine Ratio: 18 (ref 9–23)
BUN: 10 mg/dL (ref 6–24)
Bilirubin Total: 0.7 mg/dL (ref 0.0–1.2)
CO2: 26 mmol/L (ref 20–29)
Calcium: 9.1 mg/dL (ref 8.7–10.2)
Chloride: 103 mmol/L (ref 96–106)
Creatinine, Ser: 0.57 mg/dL (ref 0.57–1.00)
Globulin, Total: 3.2 g/dL (ref 1.5–4.5)
Glucose: 96 mg/dL (ref 70–99)
Potassium: 4.2 mmol/L (ref 3.5–5.2)
Sodium: 142 mmol/L (ref 134–144)
Total Protein: 6.9 g/dL (ref 6.0–8.5)
eGFR: 114 mL/min/{1.73_m2} (ref >59)

## 2022-01-15 LAB — LIPID PANEL W/O CHOL/HDL RATIO
Cholesterol, Total: 124 mg/dL (ref 100–199)
HDL: 45 mg/dL (ref >39)
LDL Chol Calc (NIH): 61 mg/dL (ref 0–99)
Triglycerides: 93 mg/dL (ref 0–149)
VLDL Cholesterol Cal: 18 mg/dL (ref 5–40)

## 2022-01-15 LAB — TSH: TSH: 2.6 u[IU]/mL (ref 0.450–4.500)

## 2022-01-15 LAB — VITAMIN B12: Vitamin B-12: 362 pg/mL (ref 232–1245)

## 2022-01-15 NOTE — Progress Notes (Signed)
Contacted via MyChart   Good morning Amy [name], your labs have returned and overall look great.  Continue al current medications.  Your cholesterol levels look much better with statin on board!!  B12 level is a little on low side of normal, please start taking Vitamin B12 1000 MCG daily which is good for nervous system health.  These levels can become lower with Metformin use, we will monitor.  Any questions? Keep being amazing!!  Thank you for allowing me to participate in your care.  I appreciate you. Kindest regards, Donovyn Guidice

## 2022-02-09 IMAGING — US US EXTREM LOW VENOUS*L*
1 series · 13 of 24 positions shown · non-contrast
Comparison: None.

CLINICAL DATA: 44-year-old female with history of leg cramping
after recent flight.

EXAM:
LEFT LOWER EXTREMITY VENOUS DOPPLER ULTRASOUND
TECHNIQUE: Gray-scale sonography with graded compression, as well as color
Doppler and duplex ultrasound were performed to evaluate the left
lower extremity deep venous systems from the level of the common
femoral vein and including the common femoral, femoral, profunda
femoral, popliteal and calf veins including the posterior tibial,
peroneal and gastrocnemius veins when visible. Spectral Doppler was
utilized to evaluate flow at rest and with distal augmentation
maneuvers in the common femoral, femoral and popliteal veins. The
contralateral common femoral vein was also evaluated for comparison.

[Series 1: us venous img lower uni left (dvt) · portal-venous · 13 of 36 slices shown]
[im 1/36]
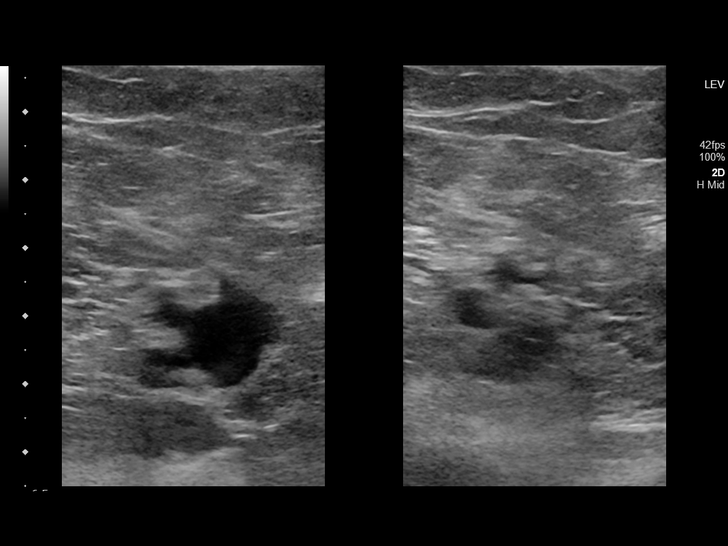
[im 4/36]
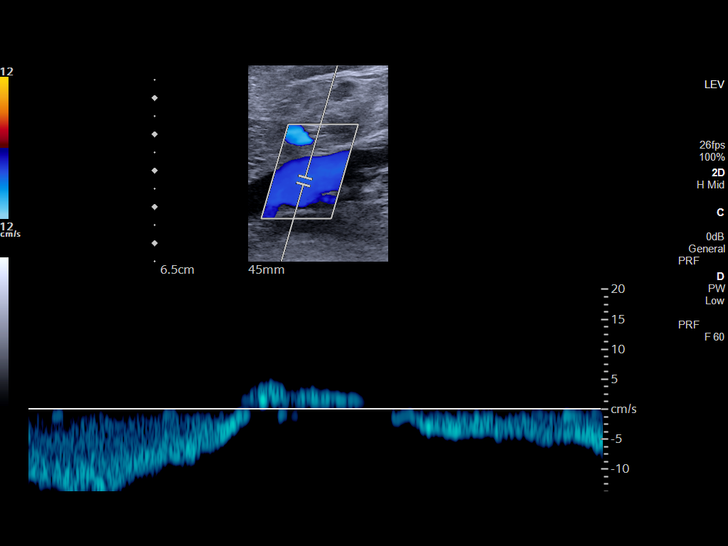
[im 7/36]
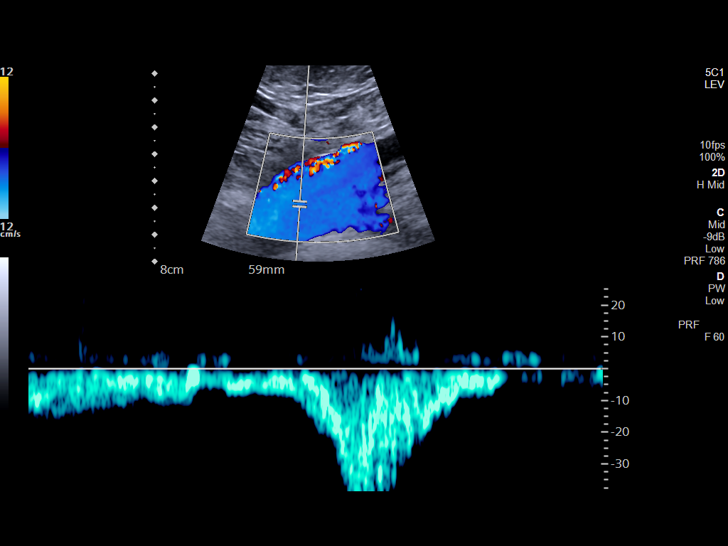
[im 10/36]
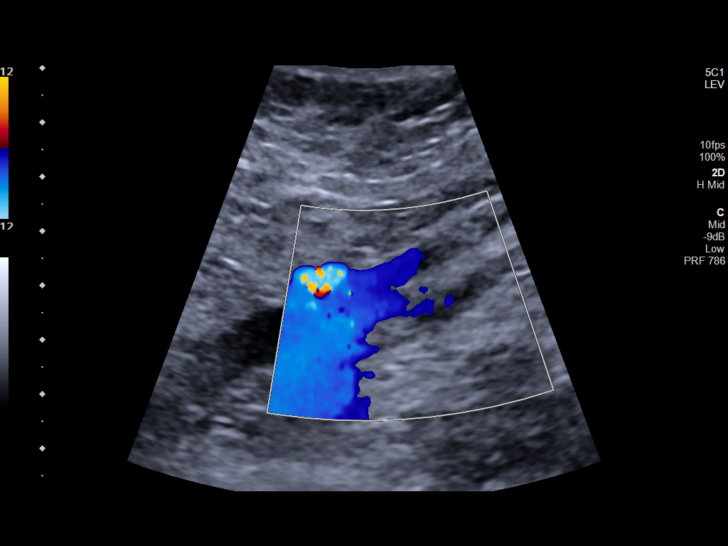
[im 13/36]
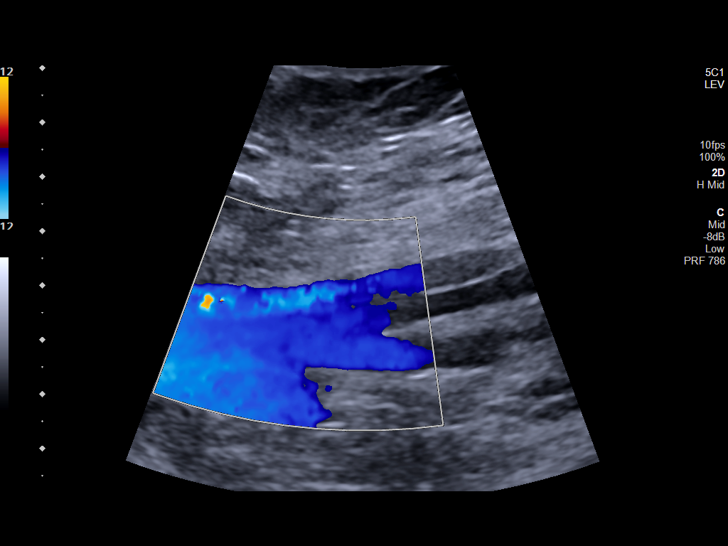
[im 16/36]
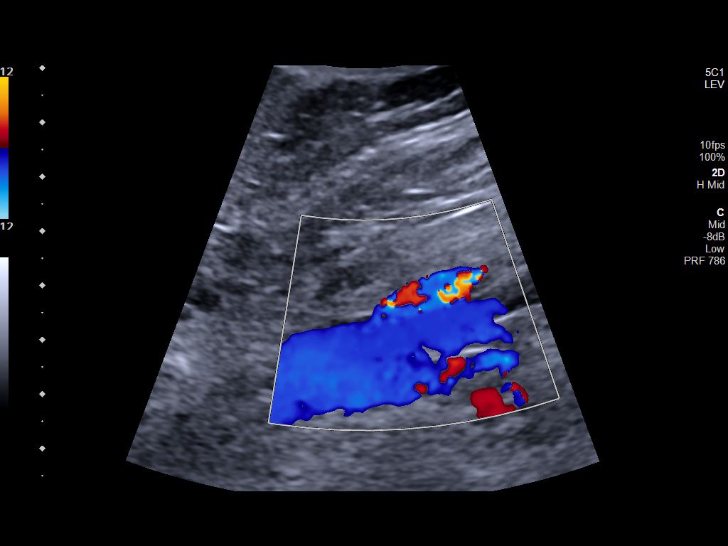
[im 19/36]
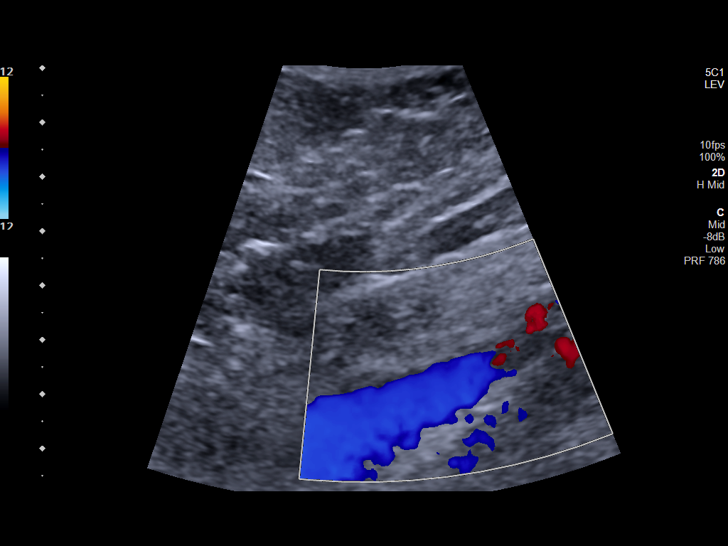
[im 20/36]
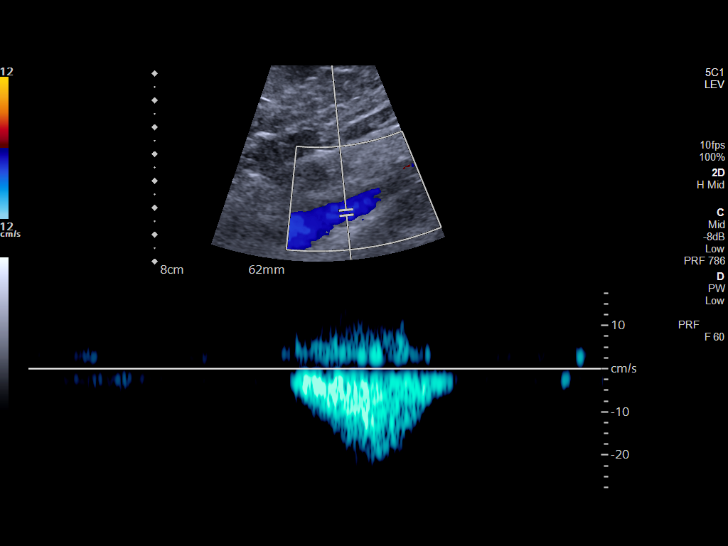
[im 23/36]
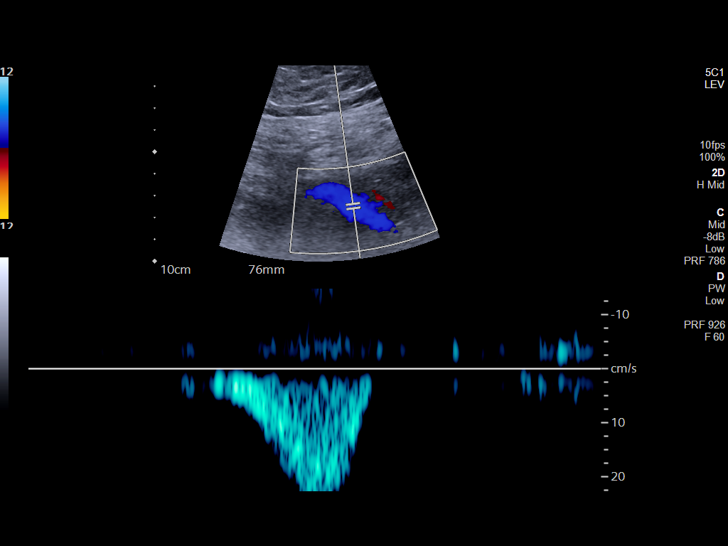
[im 26/36]
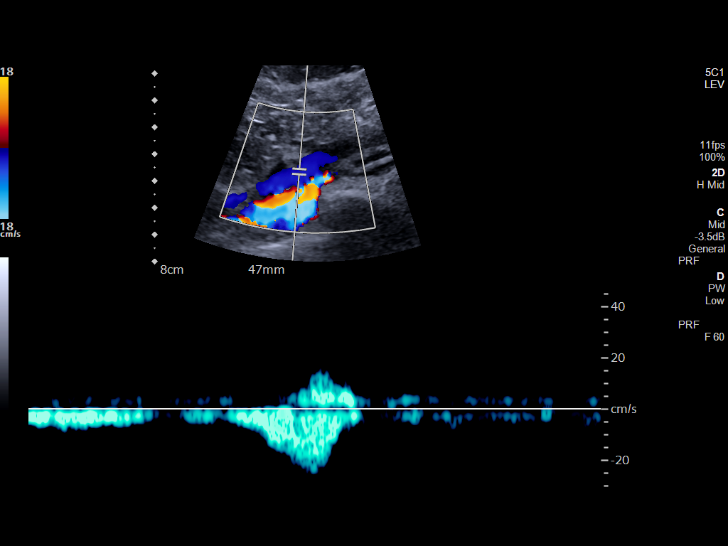
[im 29/36]
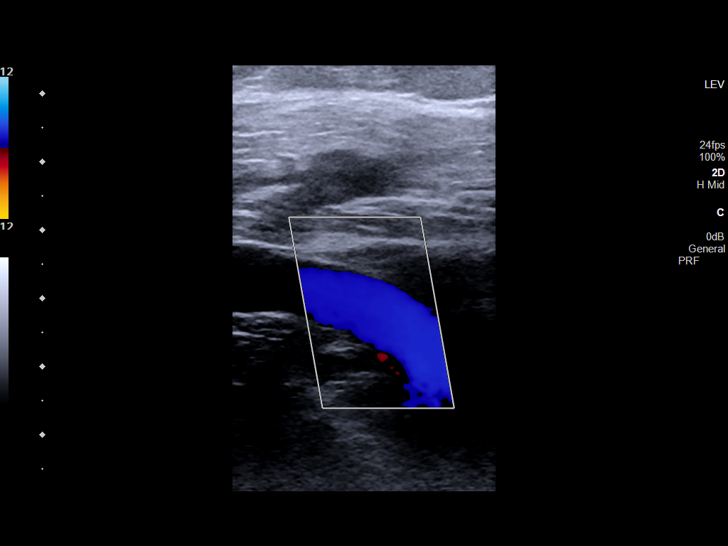
[im 32/36]
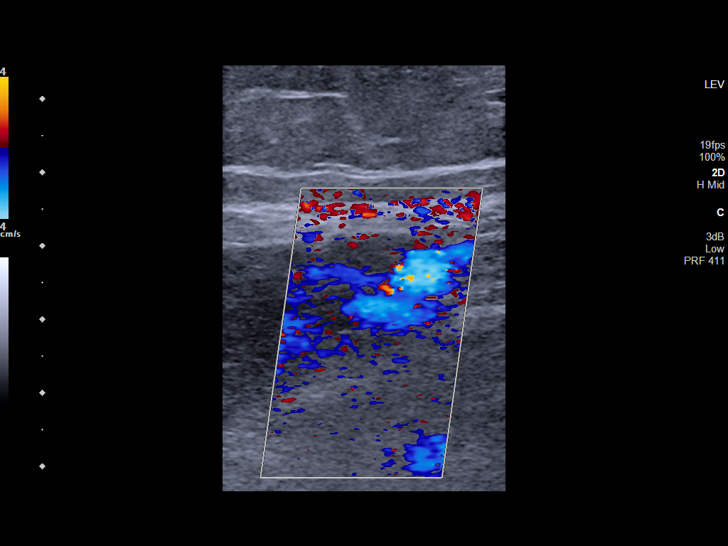
[im 36/36]
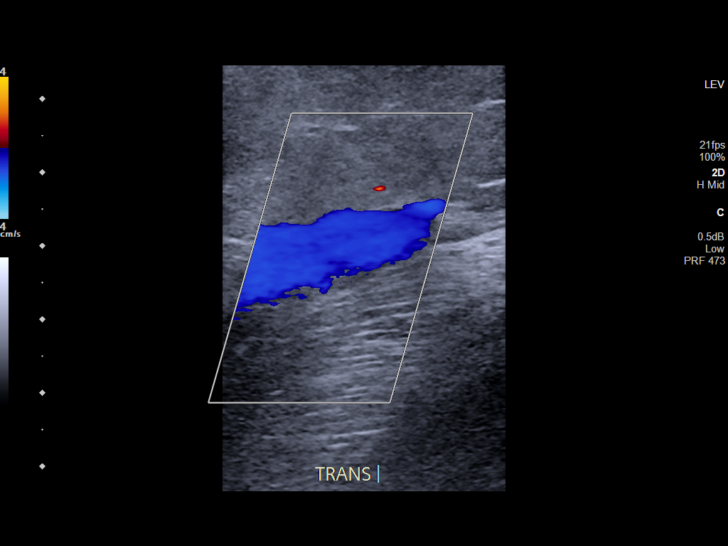

[13 of 24 positions shown; findings below may reference images not displayed]

FINDINGS: LEFT LOWER EXTREMITY

Common Femoral Vein: No evidence of thrombus. Normal
compressibility, respiratory phasicity and response to augmentation.

Central Greater Saphenous Vein: No evidence of thrombus. Normal
compressibility and flow on color Doppler imaging.

Central Profunda Femoral Vein: No evidence of thrombus. Normal
compressibility and flow on color Doppler imaging.

Femoral Vein: No evidence of thrombus. Normal compressibility,
respiratory phasicity and response to augmentation.

Popliteal Vein: No evidence of thrombus. Normal compressibility,
respiratory phasicity and response to augmentation.

Calf Veins: No evidence of thrombus. Normal compressibility and flow
on color Doppler imaging.

Other Findings:  None.

RIGHT LOWER EXTREMITY

Common Femoral Vein: No evidence of thrombus. Normal
compressibility, respiratory phasicity and response to augmentation.
IMPRESSION: No evidence of left lower extremity deep venous thrombosis.

## 2022-02-15 ENCOUNTER — Telehealth: Payer: Self-pay | Admitting: Cardiovascular Disease

## 2022-02-15 NOTE — Telephone Encounter (Signed)
STAT if HR is under 50 or over 120 ?(normal HR is 60-100 beats per minute) ? ?What is your heart rate?  Now 28 / 68 never same per apple watch  ? ?Do you have a log of your heart rate readings (document readings)? 100-130 over the weekend . Baseline 60-70 then up to 100's with ambulation  ? ?Do you have any other symptoms? Flushed when tach  ?Always tired so unsure if related  ? ? ?Patient took extra 2.5 mg of bystolic this weekend  ?

## 2022-02-15 NOTE — Telephone Encounter (Signed)
Was able to return call to Mrs. Dowen, she reports has been feeling flushed in the face and her face has been red, reports has been taking her HR and noticed it has been bouncing around, HR high as 130s. She did take a half tab 2.5 mg Bystolic this weekend to help her rate. Able to assess rate by apple watch, current rate now is 68. No s/s, pt reports feeling fine at the moment.  ? ?Advised pt, based on her las OV a few months ago, Dr. Rockey Situ advised ? ?bystolic 5 mg daily  ?May take extra 5 mg as needed  ?for tachycardia ? ?Advised when up and moving if HR>100, needs to sit and rest for at least 10-15 mins, monitor HR to make sure it trends back down. ? ?Mrs. Mogle verbalized understanding, appt for 3/29 at 10:55 am with Ignacia Bayley, NP to reevaluate her medications and need for EKG. Advised pt if her HR continues to be elevated, even after the extra 5 mg Bystolic, feeling flushed, having CP, increase SOB, or dizziness then please seek the ER for intervention. Mrs. Sylvain verbalized understanding. ? ?Otherwise all questions were address and no additional concerns at this time. Mrs. Neils thankful for the return call and advice. Agreeable to plan, will call back for anything further.   ? ?

## 2022-03-10 ENCOUNTER — Encounter: Payer: Self-pay | Admitting: Nurse Practitioner

## 2022-03-10 ENCOUNTER — Other Ambulatory Visit: Payer: Self-pay

## 2022-03-10 ENCOUNTER — Ambulatory Visit: Payer: Managed Care, Other (non HMO) | Admitting: Nurse Practitioner

## 2022-03-10 VITALS — BP 120/68 | HR 65 | Ht 62.0 in | Wt 317.0 lb

## 2022-03-10 DIAGNOSIS — I479 Paroxysmal tachycardia, unspecified: Secondary | ICD-10-CM | POA: Diagnosis not present

## 2022-03-10 DIAGNOSIS — I10 Essential (primary) hypertension: Secondary | ICD-10-CM

## 2022-03-10 NOTE — Progress Notes (Signed)
? ? ?Office Visit  ?  ?Patient Name: Amy [name] ?Date of Encounter: 03/10/2022 ? ?Primary Care Provider:  Cannady, Jolene T, NP ?Primary Cardiologist:  Timothy Gollan, MD ? ?Chief Complaint  ?  ?46-year-old female with a history of hypertension, diabetes, asthma, and obesity, who presents for follow-up related to palpitations and tachycardia. ? ?Past Medical History  ?  ?Past Medical History:  ?Diagnosis Date  ? Allergy   ? Asthma   ? Diabetes mellitus without complication (HCC)   ? Essential hypertension   ? Obesity   ? Psoriasis   ? Sinus tachycardia   ? ?Past Surgical History:  ?Procedure Laterality Date  ? CESAREAN SECTION    ? X 2  ? HAND SURGERY Right   ? THUMB  ? HERNIA REPAIR    ? right thumb surgery    ? ? ?Allergies ? ?Allergies  ?Allergen Reactions  ? Biaxin [Clarithromycin] Nausea And Vomiting  ? ? ?History of Present Illness  ?  ?46-year-old female with above past medical history including hypertension, diabetes, asthma, and obesity.  In November 2022, she was evaluated by Dr. Gollan secondary to elevations in heart rates with activity.  It was felt that this likely represented sinus tachycardia.  Adjustments were made to her antihypertensive therapy and Bystolic 5 mg daily was added.  She reported improvement in heart rates when seen by primary care in early February. ? ?Since her last visit, Amy [name] notes that she has done well.  She reiterates that her heart rates have been stable.  She has been trying to walk more regularly and has tolerated this well.  Heart rate will rise to 130 or higher if she is active such as when vacuuming home.  This can sometimes be associated with flushing in her face.  Since adjusting her blood pressure medications, in addition to improve heart rate control, she is also noted improvement in blood pressure.  She denies chest pain, palpitations, dyspnea, PND, orthopnea, dizziness, syncope, edema, or early satiety.   ? ?Home Medications  ?  ?Current Outpatient  Medications  ?Medication Sig Dispense Refill  ? Blood Glucose Monitoring Suppl (ONETOUCH VERIO) w/Device KIT Use to check blood sugar once a day, first thing in morning fasting, with goal blood sugar < 130. 1 kit 0  ? glucose blood (ONETOUCH VERIO) test strip Use to check blood sugar once daily, fasting. 100 each 12  ? Lancets (ONETOUCH ULTRASOFT) lancets Use to check blood sugar once daily, fasting. 100 each 12  ? levocetirizine (XYZAL) 5 MG tablet Take 1 tablet (5 mg total) by mouth daily. 90 tablet 3  ? levothyroxine (SYNTHROID) 50 MCG tablet Take 1 tablet (50 mcg total) by mouth daily. 90 tablet 4  ? losartan (COZAAR) 25 MG tablet Take 1 tablet (25 mg total) by mouth daily. 90 tablet 4  ? metFORMIN (GLUCOPHAGE) 500 MG tablet Start out taking 1 tablet (500 MG) by mouth daily at bedtime. 90 tablet 3  ? Multiple Vitamin (MULTIVITAMINS PO) Take 1 tablet by mouth daily.     ? nebivolol (BYSTOLIC) 5 MG tablet Take 1 tablet (5 mg total) by mouth daily. May take an extra 5 mg as needed for tachycardia 100 tablet 3  ? rosuvastatin (CRESTOR) 10 MG tablet Take 1 tablet (10 mg total) by mouth daily. 90 tablet 4  ? Vitamin D, Ergocalciferol, (DRISDOL) 1.25 MG (50000 UNIT) CAPS capsule Take 1 capsule (50,000 Units total) by mouth once a week. 12 capsule 4  ? ?  No current facility-administered medications for this visit.  ?  ? ?Review of Systems  ?  ?She denies chest pain, palpitations, dyspnea, pnd, orthopnea, n, v, dizziness, syncope, edema, weight gain, or early satiety.  All other systems reviewed and are otherwise negative except as noted above. ?  ? ?Physical Exam  ?  ?VS:  BP 120/68 (BP Location: Left Arm, Patient Position: Sitting, Cuff Size: Large)   Pulse 65   Ht 5' 2" (1.575 m)   Wt (!) 317 lb (143.8 kg)   SpO2 97%   BMI 57.98 kg/m?  , BMI Body mass index is 57.98 kg/m?. ?    ?GEN: Obese, in no acute distress. ?HEENT: normal. ?Neck: Supple, obese, difficult to gauge JVP.  No bruits or masses.   ?Cardiac: RRR, no  murmurs, rubs, or gallops. No clubbing, cyanosis, edema.  Radials/PT 2+ and equal bilaterally.  ?Respiratory:  Respirations regular and unlabored, clear to auscultation bilaterally. ?GI: Soft, nontender, nondistended, BS + x 4. ?MS: no deformity or atrophy. ?Skin: warm and dry, no rash. ?Neuro:  Strength and sensation are intact. ?Psych: Normal affect. ? ?Accessory Clinical Findings  ?  ?ECG personally reviewed by me today -regular sinus rhythm, 65, baseline artifact- no acute changes. ? ?Lab Results  ?Component Value Date  ? WBC 10.1 01/14/2022  ? HGB 12.7 01/14/2022  ? HCT 39.6 01/14/2022  ? MCV 88 01/14/2022  ? PLT 220 01/14/2022  ? ?Lab Results  ?Component Value Date  ? CREATININE 0.57 01/14/2022  ? BUN 10 01/14/2022  ? NA 142 01/14/2022  ? K 4.2 01/14/2022  ? CL 103 01/14/2022  ? CO2 26 01/14/2022  ? ?Lab Results  ?Component Value Date  ? ALT 6 01/14/2022  ? AST 16 01/14/2022  ? GGT 13 09/05/2017  ? ALKPHOS 93 01/14/2022  ? BILITOT 0.7 01/14/2022  ? ?Lab Results  ?Component Value Date  ? CHOL 124 01/14/2022  ? HDL 45 01/14/2022  ? [place] 61 01/14/2022  ? TRIG 93 01/14/2022  ? CHOLHDL 3.5 09/05/2017  ?  ?Lab Results  ?Component Value Date  ? HGBA1C 5.6 01/14/2022  ? ? ?Assessment & Plan  ?  ?1.  Tachycardia: Patient with a history of heart rate elevations in the setting of activity, which were not necessarily causing symptoms but bothered her because she was seeing elevations on her Apple Watch and was not sure what was the normal expectation.  Since starting Bystolic in November, she has noted improvement in resting heart rates with reduced heart rate excursion with activity.  She does not experience tachypalpitations or tachycardia in the absence of activity.  Reassurance offered today.  Continue Bystolic therapy.  Strongly encouraged her to exercise 30 minutes daily and work on her condition. ? ?2.  Essential hypertension: Stable at 120/68.  She is also noted stability at home since adjusting her  medications, associated with improved activity tolerance and reduction in fatigue.  She remains losartan and Bystolic therapy. ? ?3.  Morbid obesity/deconditioning: Suspect heart rate elevations with activity represent deconditioning and sinus tachycardia.  She has been walking some and I encouraged 30 minutes of daily exercise. ? ?4.  Type 2 diabetes mellitus: Followed closely by primary care with an A1c of 5.6 in February. ? ?5.  Disposition: Follow-up in 6 months or sooner if necessary. ? ? ?Murray Hodgkins, NP ?03/10/2022, 12:12 PM ? ?

## 2022-03-10 NOTE — Patient Instructions (Signed)
Medication Instructions:  ?No changes at this time.  ? ?*If you need a refill on your cardiac medications before your next appointment, please call your pharmacy* ? ? ?Lab Work: ?None ? ?If you have labs (blood work) drawn today and your tests are completely normal, you will receive your results only by: ?MyChart Message (if you have MyChart) OR ?A paper copy in the mail ?If you have any lab test that is abnormal or we need to change your treatment, we will call you to review the results. ? ? ?Testing/Procedures: ?None ? ? ?Follow-Up: ?At [facility], you and your health needs are our priority.  As part of our continuing mission to provide you with exceptional heart care, we have created designated Provider Care Teams.  These Care Teams include your primary Cardiologist (physician) and Advanced Practice Providers (APPs -  Physician Assistants and Nurse Practitioners) who all work together to provide you with the care you need, when you need it. ? ? ?Your next appointment:   ?6 month(s) ? ?The format for your next appointment:   ?In Person ? ?Provider:   ?Ida Rogue, MD or Murray Hodgkins, NP ?

## 2022-04-05 ENCOUNTER — Ambulatory Visit: Payer: Managed Care, Other (non HMO) | Admitting: Cardiovascular Disease

## 2022-05-22 NOTE — Patient Instructions (Signed)

## 2022-05-25 ENCOUNTER — Ambulatory Visit: Payer: Managed Care, Other (non HMO) | Admitting: Nurse Practitioner

## 2022-05-25 ENCOUNTER — Encounter: Payer: Self-pay | Admitting: Nurse Practitioner

## 2022-05-25 DIAGNOSIS — H9202 Otalgia, left ear: Secondary | ICD-10-CM

## 2022-05-25 DIAGNOSIS — I479 Paroxysmal tachycardia, unspecified: Secondary | ICD-10-CM

## 2022-05-25 DIAGNOSIS — E559 Vitamin D deficiency, unspecified: Secondary | ICD-10-CM

## 2022-05-25 DIAGNOSIS — E1159 Type 2 diabetes mellitus with other circulatory complications: Secondary | ICD-10-CM | POA: Diagnosis not present

## 2022-05-25 DIAGNOSIS — E1169 Type 2 diabetes mellitus with other specified complication: Secondary | ICD-10-CM | POA: Diagnosis not present

## 2022-05-25 DIAGNOSIS — Z87898 Personal history of other specified conditions: Secondary | ICD-10-CM

## 2022-05-25 DIAGNOSIS — E785 Hyperlipidemia, unspecified: Secondary | ICD-10-CM

## 2022-05-25 DIAGNOSIS — E039 Hypothyroidism, unspecified: Secondary | ICD-10-CM

## 2022-05-25 DIAGNOSIS — I152 Hypertension secondary to endocrine disorders: Secondary | ICD-10-CM

## 2022-05-25 LAB — BAYER DCA HB A1C WAIVED: HB A1C (BAYER DCA - WAIVED): 5.4 % (ref 4.8–5.6)

## 2022-05-25 MED ORDER — ROSUVASTATIN CALCIUM 10 MG PO TABS: 10.0000 mg | ORAL_TABLET | Freq: Every day | ORAL | 4 refills | Status: DC

## 2022-05-25 MED ORDER — SCOPOLAMINE 1 MG/3DAYS TD PT72: 1.0000 | MEDICATED_PATCH | TRANSDERMAL | 0 refills | Status: DC

## 2022-05-25 NOTE — Assessment & Plan Note (Signed)
Chronic, stable.  BP at goal in office today.  Continue Losartan 50 MG daily and Bystolic 5 MG to take at night. Tolerating medications well.  Will continue collaboration with cardiology. Recommend she monitor BP at least a few mornings a week at home and document.  DASH diet at home. Labs: up to date.  Return in October for physical.

## 2022-05-25 NOTE — Progress Notes (Addendum)
BP 118/77   Pulse 72   Temp 98.2 F (36.8 C) (Oral)   Wt (!) 323 lb (146.5 kg)   SpO2 96%   BMI 59.08 kg/m    Subjective:    Patient ID: Amy [name], female    DOB: [birth year], 46 y.o.   MRN: [id number]  HPI: [name] is a 46 y.o. female  Chief Complaint  Patient presents with   Hypertension   Hyperlipidemia   Diabetes   Hypothyroidism   EAR PAIN Has been hurting to left [place]  Would like patches for nausea as is going on cruise in two weeks. Duration: days Involved ear(s): left Severity:  4/10  Quality:  dull, aching, and throbbing Fever: no Otorrhea: no Upper respiratory infection symptoms: no Pruritus: no Hearing loss: no Water immersion yes Using Q-tips: yes Recurrent otitis media: no Status: stable Treatments attempted: none   DIABETES Taking Metformin 500 MG at night.  A1c 5.6% last in February. Hypoglycemic episodes:no Polydipsia/polyuria: no Visual disturbance: no Chest pain: no Paresthesias: no Glucose Monitoring: yes  Accucheck frequency: Daily  Fasting glucose: 90 to 105  Post prandial:  Evening:  Before meals: Taking Insulin?: no  Long acting insulin:  Short acting insulin: Blood Pressure Monitoring: daily Retinal Examination: Up to Date -- Dr. Gloriann Loan scheduled next week Foot Exam: Up to Date Pneumovax: Up to Date Influenza: Up to Date Aspirin: no   HYPERTENSION / HYPERLIPIDEMIA Continues on Losartan 25 MG daily, Bystolic 5 MG daily, and Crestor 10 MG daily.  Seen by Dr. Rockey Situ with cardiology due to tachycardia -- last saw 03/10/22. Satisfied with current treatment? yes Duration of hypertension: chronic BP monitoring frequency: daily BP range: 100/60-70 BP medication side effects: no Past BP meds: as above Duration of hyperlipidemia: chronic Cholesterol medication side effects: no Cholesterol supplements: none Past cholesterol medications: none Medication compliance: good  compliance Aspirin: no Recent stressors: no Recurrent headaches: no Visual changes: no Palpitations: no Dyspnea: no Chest pain: no Lower extremity edema: no Dizzy/lightheaded: no   HYPOTHYROIDISM Continues on Levothyroxine 50 MCG.  Continues on Vitamin D weekly with history of low levels.  Has started B12 supplement as level last visit on low side of normal. Thyroid control status:stable Satisfied with current treatment? yes Medication side effects: no Medication compliance: good compliance Etiology of hypothyroidism:  Recent dose adjustment:no Fatigue: no Cold intolerance: no Heat intolerance: no Weight gain: no Weight loss: no Constipation: no Diarrhea/loose stools: no Palpitations: no Lower extremity edema: no Anxiety/depressed mood: no      05/25/2022    8:59 AM 01/14/2022    9:11 AM 10/28/2021    1:41 PM 10/02/2021    1:30 PM 09/10/2021    9:24 AM  Depression screen PHQ 2/9  Decreased Interest 0 0 1 0 2  Down, Depressed, Hopeless 0 0 0 0 0  PHQ - 2 Score 0 0 1 0 2  Altered sleeping 0 0 0 2 2  Tired, decreased energy 0 0 '1 2 3  '$ Change in appetite 0 0 0 1 1  Feeling bad or failure about yourself  0 0 0 0 0  Trouble concentrating 0 0 '1 1 2  '$ Moving slowly or fidgety/restless 0 0 0 0 0  Suicidal thoughts 0 0 0 0 0  PHQ-9 Score 0 0 '3 6 10  '$ Difficult doing work/chores Not difficult at all Not difficult at all Not difficult at all Somewhat difficult Somewhat difficult  05/25/2022    8:59 AM 01/14/2022    9:12 AM 10/28/2021    1:42 PM 10/02/2021    1:31 PM  GAD 7 : Generalized Anxiety Score  Nervous, Anxious, on Edge 0 0 0 2  Control/stop worrying 0 0 0 0  Worry too much - different things 0 0 0 1  Trouble relaxing 0 0 1 2  Restless 0 0 0 2  Easily annoyed or irritable 0 0 1 1  Afraid - awful might happen 0 0 0 0  Total GAD 7 Score 0 0 2 8  Anxiety Difficulty Not difficult at all Not difficult at all Not difficult at all Somewhat difficult   Relevant past  medical, surgical, family and social history reviewed and updated as indicated. Interim medical history since our last visit reviewed. Allergies and medications reviewed and updated.  Review of Systems  Constitutional: Negative.   Respiratory: Negative.    Cardiovascular: Negative.   Gastrointestinal: Negative.   Neurological: Negative.   Psychiatric/Behavioral: Negative.     Per HPI unless specifically indicated above     Objective:    BP 118/77   Pulse 72   Temp 98.2 F (36.8 C) (Oral)   Wt (!) 323 lb (146.5 kg)   SpO2 96%   BMI 59.08 kg/m   Wt Readings from Last 3 Encounters:  05/25/22 (!) 323 lb (146.5 kg)  03/10/22 (!) 317 lb (143.8 kg)  01/14/22 (!) 317 lb 6.4 oz (144 kg)    Physical Exam Vitals and nursing note reviewed.  Constitutional:      General: She is awake. She is not in acute distress.    Appearance: She is well-developed and well-groomed. She is morbidly obese. She is not ill-appearing or toxic-appearing.  HENT:     Head: Normocephalic.     Right Ear: Hearing, tympanic membrane, ear canal and external ear normal.     Left Ear: Hearing, ear canal and external ear normal. No tenderness. A middle ear effusion is present. There is no impacted cerumen. Tympanic membrane is not injected or bulging.  Eyes:     General: Lids are normal.        Right eye: No discharge.        Left eye: No discharge.     Conjunctiva/sclera: Conjunctivae normal.     Pupils: Pupils are equal, round, and reactive to light.  Neck:     Thyroid: No thyromegaly.     Vascular: No carotid bruit.  Cardiovascular:     Rate and Rhythm: Normal rate and regular rhythm.     Heart sounds: Normal heart sounds. No murmur heard.    No gallop.  Pulmonary:     Effort: Pulmonary effort is normal. No accessory muscle usage or respiratory distress.     Breath sounds: Normal breath sounds.  Abdominal:     General: Bowel sounds are normal.     Palpations: Abdomen is soft.  Musculoskeletal:      Cervical back: Normal range of motion and neck supple.     Right lower leg: No edema.     Left lower leg: No edema.  Skin:    General: Skin is warm and dry.  Neurological:     Mental Status: She is alert and oriented to person, place, and time.  Psychiatric:        Attention and Perception: Attention normal.        Mood and Affect: Mood normal.        Speech: Speech  normal.        Behavior: Behavior normal. Behavior is cooperative.        Thought Content: Thought content normal.    Results for orders placed or performed in visit on 03/25/22  HM DIABETES EYE EXAM  Result Value Ref Range   HM Diabetic Eye Exam No Retinopathy No Retinopathy      Assessment & Plan:   Problem List Items Addressed This Visit       Cardiovascular and Mediastinum   Hypertension associated with diabetes ([place])    Chronic, stable.  BP at goal in office today.  Continue Losartan 50 MG daily and Bystolic 5 MG to take at night. Tolerating medications well.  Will continue collaboration with cardiology. Recommend she monitor BP at least a few mornings a week at home and document.  DASH diet at home. Labs: up to date.  Return in October for physical.      Relevant Medications   rosuvastatin (CRESTOR) 10 MG tablet   Other Relevant Orders   Bayer DCA Hb A1c Waived   Paroxysmal tachycardia ([place])    Remains stable, continue Bystolic as ordered by Dr. Rockey Situ, cardiology, is tolerating well.  Will not increase at this time, although per cardiology note can consider increase to 5 MG BID or 10 MG nightly if needed in future.  Overall her HR is remaining stable at this time with no symptoms.      Relevant Medications   rosuvastatin (CRESTOR) 10 MG tablet     Endocrine   Hyperlipidemia associated with type 2 diabetes mellitus ([facility])    Chronic, stable.  Continue Rosuvastatin daily and adjust as needed.  Lipid panel up to date.      Relevant Medications   rosuvastatin (CRESTOR) 10 MG tablet   Other Relevant  Orders   Bayer DCA Hb A1c Waived   Hypothyroid    Chronic, stable.  Continue current medication regimen and adjust as needed. Thyroid labs up to date.      Type 2 diabetes mellitus with morbid obesity ([place]) - Primary    Diagnosed in October 2021 -- A1c 5.4% at this time and urine ALB 30 February 2023.  Continue current medication regimen and adjust as needed + continue diet focus.  Continue to monitor BS daily.  Consider trial off medication if remains stable next visit.  Return in October for physical.      Relevant Medications   rosuvastatin (CRESTOR) 10 MG tablet   Other Relevant Orders   Bayer DCA Hb A1c Waived     Other   Morbid obesity ([place])    BMI 59.08 with T2DM, HTN/HLD.  Recommended eating smaller high protein, low fat meals more frequently and exercising 30 mins a day 5 times a week with a goal of 10-15lb weight loss in the next 3 months. Patient voiced their understanding and motivation to adhere to these recommendations.       Vitamin D deficiency    Ongoing, continue weekly supplement and recheck level next visit.  Could consider changing to daily Vitamin D3 if level improving.      Other Visit Diagnoses     Left ear pain       Suspect eustachian tube dysfunction, does not tolerate Prednisone.  Recommend daily Zyrtec and Flonase + avoid water in ear over next week.     History of motion sickness       Scopolamine patches sent in.        Follow up plan: Return  in about 4 months (around 10/04/2022) for Annual physical with diabetes check.

## 2022-05-25 NOTE — Assessment & Plan Note (Signed)
BMI 59.08 with T2DM, HTN/HLD.  Recommended eating smaller high protein, low fat meals more frequently and exercising 30 mins a day 5 times a week with a goal of 10-15lb weight loss in the next 3 months. Patient voiced their understanding and motivation to adhere to these recommendations.

## 2022-05-25 NOTE — Assessment & Plan Note (Signed)
Chronic, stable.  Continue Rosuvastatin daily and adjust as needed.  Lipid panel up to date.

## 2022-05-25 NOTE — Assessment & Plan Note (Signed)
Chronic, stable.  Continue current medication regimen and adjust as needed.  Thyroid labs up to date. 

## 2022-05-25 NOTE — Assessment & Plan Note (Signed)
Remains stable, continue Bystolic as ordered by Dr. Rockey Situ, cardiology, is tolerating well.  Will not increase at this time, although per cardiology note can consider increase to 5 MG BID or 10 MG nightly if needed in future.  Overall her HR is remaining stable at this time with no symptoms.

## 2022-05-25 NOTE — Assessment & Plan Note (Signed)
Ongoing, continue weekly supplement and recheck level next visit.  Could consider changing to daily Vitamin D3 if level improving.

## 2022-05-25 NOTE — Assessment & Plan Note (Signed)
Diagnosed in October 2021 -- A1c 5.4% at this time and urine ALB 30 February 2023.  Continue current medication regimen and adjust as needed + continue diet focus.  Continue to monitor BS daily.  Consider trial off medication if remains stable next visit.  Return in October for physical.

## 2022-07-19 ENCOUNTER — Other Ambulatory Visit: Payer: Self-pay

## 2022-07-19 NOTE — Telephone Encounter (Signed)
Entered in error

## 2022-08-03 ENCOUNTER — Ambulatory Visit: Payer: Managed Care, Other (non HMO) | Admitting: Nurse Practitioner

## 2022-08-03 ENCOUNTER — Encounter: Payer: Self-pay | Admitting: Nurse Practitioner

## 2022-08-03 VITALS — BP 118/68 | HR 76 | Temp 98.6°F | Ht 62.0 in | Wt 322.8 lb

## 2022-08-03 DIAGNOSIS — R12 Heartburn: Secondary | ICD-10-CM | POA: Insufficient documentation

## 2022-08-03 DIAGNOSIS — R1012 Left upper quadrant pain: Secondary | ICD-10-CM | POA: Diagnosis not present

## 2022-08-03 NOTE — Progress Notes (Signed)
BP 118/68   Pulse 76   Temp 98.6 F (37 C) (Oral)   Ht _0  (1.575 m)   Wt (!) 322 lb 12.8 oz (146.4 kg)   SpO2 97%   BMI 59.04 kg/m    Subjective:    Patient ID: Amy [name], female    DOB: May 25, 1976, 46 y.o.   MRN: [id number]  HPI: Amy [name] is a 46 y.o. female  Chief Complaint  Patient presents with   Abdominal Pain    Patient is here for Abdominal Pain. Patient is located under her Left Breast area. Patient says the pain is a dull ache and discomfort that started last week and it feels like someone is pressure a weight against it. Patient says it is uncomfortable as it is the side she sleeps on.   ABDOMINAL PAIN  Started about one week ago to LUQ.  Is dull and aching discomfort, like someone sitting on belly.  Continues on Metformin for diabetes -- blood sugar has been 90 to 120.  No recent heavy lifting or vigorous activity.  Went on cruise one month ago and had allergic reaction to [employer]. Duration:days Onset: sudden Severity: 5/10 Quality: dull and aching Location:  LUQ  Episode duration: constant Radiation: no Frequency: constant Alleviating factors: standing up Aggravating factors: bending over makes worse Status: stable Treatments attempted: none Fever: no Nausea: no Vomiting: no Weight loss: no Decreased appetite:  occasional Diarrhea:  occasional Constipation:  occasional  -- last BM was this morning, normal no straining Blood in stool: no Heartburn: no Jaundice: no Rash: no Dysuria/urinary frequency: no Hematuria: no History of sexually transmitted disease: no Recurrent NSAID use: no   Relevant past medical, surgical, family and social history reviewed and updated as indicated. Interim medical history since our last visit reviewed. Allergies and medications reviewed and updated.  Review of Systems  Constitutional: Negative.   Respiratory:  Negative for cough, chest tightness, shortness of breath and wheezing.    Cardiovascular:  Negative for chest pain, palpitations and leg swelling.  Gastrointestinal:  Positive for abdominal pain. Negative for abdominal distention, blood in stool, constipation, diarrhea, nausea and vomiting.  Neurological: Negative.   Psychiatric/Behavioral: Negative.      Per HPI unless specifically indicated above     Objective:    BP 118/68   Pulse 76   Temp 98.6 F (37 C) (Oral)   Ht _1  (1.575 m)   Wt (!) 322 lb 12.8 oz (146.4 kg)   SpO2 97%   BMI 59.04 kg/m   Wt Readings from Last 3 Encounters:  08/03/22 (!) 322 lb 12.8 oz (146.4 kg)  05/25/22 (!) 323 lb (146.5 kg)  03/10/22 (!) 317 lb (143.8 kg)    Physical Exam Vitals and nursing note reviewed.  Constitutional:      General: She is awake. She is not in acute distress.    Appearance: She is well-developed and well-groomed. She is morbidly obese. She is not ill-appearing.  HENT:     Head: Normocephalic.     Right Ear: Hearing normal.     Left Ear: Hearing normal.  Eyes:     General: Lids are normal.        Right eye: No discharge.        Left eye: No discharge.     Conjunctiva/sclera: Conjunctivae normal.     Pupils: Pupils are equal, round, and reactive to light.  Neck:     Thyroid: No thyromegaly.  Vascular: No carotid bruit.  Cardiovascular:     Rate and Rhythm: Normal rate and regular rhythm.     Heart sounds: Normal heart sounds. No murmur heard.    No gallop.  Pulmonary:     Effort: Pulmonary effort is normal. No accessory muscle usage or respiratory distress.     Breath sounds: Normal breath sounds.  Abdominal:     General: Bowel sounds are normal. There is no distension.     Palpations: Abdomen is soft.     Tenderness: There is abdominal tenderness in the epigastric area and left upper quadrant. There is no right CVA tenderness, left CVA tenderness, guarding or rebound.     Hernia: No hernia is present.  Musculoskeletal:     Cervical back: Normal range of motion and neck supple.      Right lower leg: No edema.     Left lower leg: No edema.  Skin:    General: Skin is warm and dry.  Neurological:     Mental Status: She is alert and oriented to person, place, and time.  Psychiatric:        Attention and Perception: Attention normal.        Mood and Affect: Mood normal.        Speech: Speech normal.        Behavior: Behavior normal. Behavior is cooperative.        Thought Content: Thought content normal.    Results for orders placed or performed in visit on 05/25/22  Bayer DCA Hb A1c Waived  Result Value Ref Range   HB A1C (BAYER DCA - WAIVED) 5.4 4.8 - 5.6 %      Assessment & Plan:   Problem List Items Addressed This Visit       Other   LUQ abdominal pain - Primary    Acute for one week.  Suspect GERD or muscular in etiology.  Recommend she start OTC Pepcid 20 MG daily.  Obtain labs today to include: CBC, CMP, Amylase, Lipase, and seafood allergy work-up. If abnormal results present consider imaging.  Overall no red flags today.  Recommend if acute worsening presents with further symptoms, then immediately head to ER setting.  Return in one week.      Relevant Orders   CBC with Differential/Platelet   Comprehensive metabolic panel   Lipase   Amylase   Allergy Panel 19, [facility]     Follow up plan: Return in about 1 week (around 08/10/2022) for Abdominal Pain.

## 2022-08-03 NOTE — Patient Instructions (Signed)
Take Pepcid 20 MG once daily over the counter.  Food Choices for Gastroesophageal Reflux Disease, Adult When you have gastroesophageal reflux disease (GERD), the foods you eat and your eating habits are very important. Choosing the right foods can help ease your discomfort. Think about working with a food expert (dietitian) to help you make good choices. What are tips for following this plan? Reading food labels Look for foods that are low in saturated fat. Foods that may help with your symptoms include: Foods that have less than 5% of daily value (DV) of fat. Foods that have 0 grams of trans fat. Cooking Do not fry your food. Cook your food by baking, steaming, grilling, or broiling. These are all methods that do not need a lot of fat for cooking. To add flavor, try to use herbs that are low in spice and acidity. Meal planning  Choose healthy foods that are low in fat, such as: Fruits and vegetables. Whole grains. Low-fat dairy products. Lean meats, fish, and poultry. Eat small meals often instead of eating 3 large meals each day. Eat your meals slowly in a place where you are relaxed. Avoid bending over or lying down until 2-3 hours after eating. Limit high-fat foods such as fatty meats or fried foods. Limit your intake of fatty foods, such as oils, butter, and shortening. Avoid the following as told by your doctor: Foods that cause symptoms. These may be different for different people. Keep a food diary to keep track of foods that cause symptoms. Alcohol. Drinking a lot of liquid with meals. Eating meals during the 2-3 hours before bed. Lifestyle Stay at a healthy weight. Ask your doctor what weight is healthy for you. If you need to lose weight, work with your doctor to do so safely. Exercise for at least 30 minutes on 5 or more days each week, or as told by your doctor. Wear loose-fitting clothes. Do not smoke or use any products that contain nicotine or tobacco. If you need help  quitting, ask your doctor. Sleep with the head of your bed higher than your feet. Use a wedge under the mattress or blocks under the bed frame to raise the head of the bed. Chew sugar-free gum after meals. What foods should eat?  Eat a healthy, well-balanced diet of fruits, vegetables, whole grains, low-fat dairy products, lean meats, fish, and poultry. Each person is different. Foods that may cause symptoms in one person may not cause any symptoms in another person. Work with your doctor to find foods that are safe for you. The items listed above may not be a complete list of what you can eat and drink. Contact a food expert for more options. What foods should I avoid? Limiting some of these foods may help in managing the symptoms of GERD. Everyone is different. Talk with a food expert or your doctor to help you find the exact foods to avoid, if any. Fruits Any fruits prepared with added fat. Any fruits that cause symptoms. For some people, this may include citrus fruits, such as oranges, grapefruit, pineapple, and lemons. Vegetables Deep-fried vegetables. [place] fries. Any vegetables prepared with added fat. Any vegetables that cause symptoms. For some people, this may include tomatoes and tomato products, chili peppers, onions and garlic, and horseradish. Grains Pastries or quick breads with added fat. Meats and other proteins High-fat meats, such as fatty beef or pork, hot dogs, ribs, ham, sausage, salami, and bacon. Fried meat or protein, including fried fish and fried  chicken. Nuts and nut butters, in large amounts. Dairy Whole milk and chocolate milk. Sour cream. Cream. Ice cream. Cream cheese. Milkshakes. Fats and oils Butter. Margarine. Shortening. Ghee. Beverages Coffee and tea, with or without caffeine. Carbonated beverages. Sodas. Energy drinks. Fruit juice made with acidic fruits, such as orange or grapefruit. Tomato juice. Alcoholic drinks. Sweets and desserts Chocolate and  cocoa. Donuts. Seasonings and condiments Pepper. Peppermint and spearmint. Added salt. Any condiments, herbs, or seasonings that cause symptoms. For some people, this may include curry, hot sauce, or vinegar-based salad dressings. The items listed above may not be a complete list of what you should not eat and drink. Contact a food expert for more options. Questions to ask your doctor Diet and lifestyle changes are often the first steps that are taken to manage symptoms of GERD. If diet and lifestyle changes do not help, talk with your doctor about taking medicines. Where to find more information International Foundation for Gastrointestinal Disorders: aboutgerd.org Summary When you have GERD, food and lifestyle choices are very important in easing your symptoms. Eat small meals often instead of 3 large meals a day. Eat your meals slowly and in a place where you are relaxed. Avoid bending over or lying down until 2-3 hours after eating. Limit high-fat foods such as fatty meats or fried foods. This information is not intended to replace advice given to you by your health care provider. Make sure you discuss any questions you have with your health care provider. Document Revised: 06/09/2020 Document Reviewed: 06/09/2020 Elsevier Patient Education  [place].

## 2022-08-03 NOTE — Assessment & Plan Note (Signed)
Acute for one week.  Suspect GERD or muscular in etiology.  Recommend she start OTC Pepcid 20 MG daily.  Obtain labs today to include: CBC, CMP, Amylase, Lipase, and seafood allergy work-up. If abnormal results present consider imaging.  Overall no red flags today.  Recommend if acute worsening presents with further symptoms, then immediately head to ER setting.  Return in one week.

## 2022-08-04 NOTE — Progress Notes (Signed)
Contacted via MyChart   Good afternoon [name], your labs have returned and are overall stable.  Awaiting allergy labs.  Labs do not show any cause for current discomfort, suspect this is more muscular in nature.  Any questions? Keep being amazing!!  Thank you for allowing me to participate in your care.  I appreciate you. Kindest regards, Maimuna Leaman

## 2022-08-06 LAB — CBC WITH DIFFERENTIAL/PLATELET
Basophils Absolute: 0.1 10*3/uL (ref 0.0–0.2)
Basos: 1 %
EOS (ABSOLUTE): 0.2 10*3/uL (ref 0.0–0.4)
Eos: 2 %
Hematocrit: 40 % (ref 34.0–46.6)
Hemoglobin: 13.1 g/dL (ref 11.1–15.9)
Immature Grans (Abs): 0 10*3/uL (ref 0.0–0.1)
Immature Granulocytes: 0 %
Lymphocytes Absolute: 2.9 10*3/uL (ref 0.7–3.1)
Lymphs: 31 %
MCH: 28 pg (ref 26.6–33.0)
MCHC: 32.8 g/dL (ref 31.5–35.7)
MCV: 86 fL (ref 79–97)
Monocytes Absolute: 0.6 10*3/uL (ref 0.1–0.9)
Monocytes: 7 %
Neutrophils Absolute: 5.5 10*3/uL (ref 1.4–7.0)
Neutrophils: 59 %
Platelets: 230 10*3/uL (ref 150–450)
RBC: 4.68 x10E6/uL (ref 3.77–5.28)
RDW: 13.2 % (ref 11.7–15.4)
WBC: 9.3 10*3/uL (ref 3.4–10.8)

## 2022-08-06 LAB — ALLERGY PANEL 19, SEAFOOD GROUP
Allergen Salmon IgE: <0.1 kU/L
Catfish: <0.1 kU/L
Codfish IgE: <0.1 kU/L
F023-IgE Crab: <0.1 kU/L
F080-IgE Lobster: <0.1 kU/L
Shrimp IgE: <0.1 kU/L
Tuna: <0.1 kU/L

## 2022-08-06 LAB — COMPREHENSIVE METABOLIC PANEL
ALT: 6 IU/L (ref 0–32)
AST: 13 IU/L (ref 0–40)
Albumin/Globulin Ratio: 1.3 (ref 1.2–2.2)
Albumin: 3.9 g/dL (ref 3.9–4.9)
Alkaline Phosphatase: 93 IU/L (ref 44–121)
BUN/Creatinine Ratio: 16 (ref 9–23)
BUN: 10 mg/dL (ref 6–24)
Bilirubin Total: 0.8 mg/dL (ref 0.0–1.2)
CO2: 26 mmol/L (ref 20–29)
Calcium: 9.5 mg/dL (ref 8.7–10.2)
Chloride: 100 mmol/L (ref 96–106)
Creatinine, Ser: 0.61 mg/dL (ref 0.57–1.00)
Globulin, Total: 2.9 g/dL (ref 1.5–4.5)
Glucose: 119 mg/dL — ABNORMAL HIGH (ref 70–99)
Potassium: 4.2 mmol/L (ref 3.5–5.2)
Sodium: 140 mmol/L (ref 134–144)
Total Protein: 6.8 g/dL (ref 6.0–8.5)
eGFR: 112 mL/min/{1.73_m2} (ref >59)

## 2022-08-06 LAB — AMYLASE: Amylase: 59 U/L (ref 31–110)

## 2022-08-06 LAB — LIPASE: Lipase: 32 U/L (ref 14–72)

## 2022-08-06 NOTE — Progress Notes (Signed)
Contacted via [employer] like fish allergies on these labs are not showing allergy to fish, any questions?

## 2022-08-07 NOTE — Patient Instructions (Signed)
Abdominal Pain, Adult Many things can cause belly (abdominal) pain. Most times, belly pain is not dangerous. Many cases of belly pain can be watched and treated at home. Sometimes, though, belly pain is serious. Your doctor will try to find the cause of your belly pain. Follow these instructions at home:  Medicines Take over-the-counter and prescription medicines only as told by your doctor. Do not take medicines that help you poop (laxatives) unless told by your doctor. General instructions Watch your belly pain for any changes. Drink enough fluid to keep your pee (urine) pale yellow. Keep all follow-up visits as told by your doctor. This is important. Contact a doctor if: Your belly pain changes or gets worse. You are not hungry, or you lose weight without trying. You are having trouble pooping (constipated) or have watery poop (diarrhea) for more than 2-3 days. You have pain when you pee or poop. Your belly pain wakes you up at night. Your pain gets worse with meals, after eating, or with certain foods. You are vomiting and cannot keep anything down. You have a fever. You have blood in your pee. Get help right away if: Your pain does not go away as soon as your doctor says it should. You cannot stop vomiting. Your pain is only in areas of your belly, such as the right side or the left lower part of the belly. You have bloody or black poop, or poop that looks like tar. You have very bad pain, cramping, or bloating in your belly. You have signs of not having enough fluid or water in your body (dehydration), such as: Dark pee, very little pee, or no pee. Cracked lips. Dry mouth. Sunken eyes. Sleepiness. Weakness. You have trouble breathing or chest pain. Summary Many cases of belly pain can be watched and treated at home. Watch your belly pain for any changes. Take over-the-counter and prescription medicines only as told by your doctor. Contact a doctor if your belly pain  changes or gets worse. Get help right away if you have very bad pain, cramping, or bloating in your belly. This information is not intended to replace advice given to you by your health care provider. Make sure you discuss any questions you have with your health care provider. Document Revised: 04/09/2019 Document Reviewed: 04/09/2019 Elsevier Patient Education  2023 Elsevier Inc.  

## 2022-08-11 ENCOUNTER — Ambulatory Visit: Payer: Managed Care, Other (non HMO) | Admitting: Nurse Practitioner

## 2022-08-11 ENCOUNTER — Encounter: Payer: Self-pay | Admitting: Nurse Practitioner

## 2022-08-11 DIAGNOSIS — R12 Heartburn: Secondary | ICD-10-CM

## 2022-08-11 NOTE — Progress Notes (Signed)
BP 104/67   Pulse 67   Temp 98.4 F (36.9 C) (Oral)   Ht _0  (1.575 m)   Wt (!) 327 lb (148.3 kg)   SpO2 98%   BMI 59.81 kg/m    Subjective:    Patient ID: Amy [name], female    DOB: 18-Apr-1976, 46 y.o.   MRN: [id number]  HPI: Amy [name] is a 46 y.o. female  Chief Complaint  Patient presents with   Abdominal Pain    Patient is here for a 1 week follow up on Abdominal Pain. Patient says everything is doing good. Patient denies having any concerns at today's visit.   ABDOMINAL PAIN  Follow-up on abdominal pain, currently is improved with Pepcid use daily.  No recent heavy lifting or vigorous activity.  Went on cruise one month ago and had allergic reaction to [employer]. Severity: 0/10 Alleviating factors: Pepcid Aggravating factors: none Status: improved Treatments attempted: Pepcid Fever: no Nausea: no Vomiting: no Weight loss: no Decreased appetite: no  Diarrhea:  occasional Constipation:  occasional   Blood in stool: no Heartburn: no Jaundice: no Rash: no Dysuria/urinary frequency: no Hematuria: no History of sexually transmitted disease: no Recurrent NSAID use: no   Relevant past medical, surgical, family and social history reviewed and updated as indicated. Interim medical history since our last visit reviewed. Allergies and medications reviewed and updated.  Review of Systems  Constitutional: Negative.   Respiratory:  Negative for cough, chest tightness, shortness of breath and wheezing.   Cardiovascular:  Negative for chest pain, palpitations and leg swelling.  Gastrointestinal:  Negative for abdominal distention, abdominal pain, blood in stool, constipation, diarrhea, nausea and vomiting.  Neurological: Negative.   Psychiatric/Behavioral: Negative.      Per HPI unless specifically indicated above     Objective:    BP 104/67   Pulse 67   Temp 98.4 F (36.9 C) (Oral)   Ht _1  (1.575 m)   Wt (!) 327 lb (148.3 kg)    SpO2 98%   BMI 59.81 kg/m   Wt Readings from Last 3 Encounters:  08/11/22 (!) 327 lb (148.3 kg)  08/03/22 (!) 322 lb 12.8 oz (146.4 kg)  05/25/22 (!) 323 lb (146.5 kg)    Physical Exam Vitals and nursing note reviewed.  Constitutional:      General: She is awake. She is not in acute distress.    Appearance: She is well-developed and well-groomed. She is morbidly obese. She is not ill-appearing.  HENT:     Head: Normocephalic.     Right Ear: Hearing normal.     Left Ear: Hearing normal.  Eyes:     General: Lids are normal.        Right eye: No discharge.        Left eye: No discharge.     Conjunctiva/sclera: Conjunctivae normal.     Pupils: Pupils are equal, round, and reactive to light.  Neck:     Thyroid: No thyromegaly.     Vascular: No carotid bruit.  Cardiovascular:     Rate and Rhythm: Normal rate and regular rhythm.     Heart sounds: Normal heart sounds. No murmur heard.    No gallop.  Pulmonary:     Effort: Pulmonary effort is normal. No accessory muscle usage or respiratory distress.     Breath sounds: Normal breath sounds.  Abdominal:     General: Bowel sounds are normal. There is no distension.  Palpations: Abdomen is soft.     Tenderness: There is no abdominal tenderness. There is no right CVA tenderness, left CVA tenderness, guarding or rebound.     Hernia: No hernia is present.     Comments: Overall pain improved on exam.  Musculoskeletal:     Cervical back: Normal range of motion and neck supple.     Right lower leg: No edema.     Left lower leg: No edema.  Skin:    General: Skin is warm and dry.  Neurological:     Mental Status: She is alert and oriented to person, place, and time.  Psychiatric:        Attention and Perception: Attention normal.        Mood and Affect: Mood normal.        Speech: Speech normal.        Behavior: Behavior normal. Behavior is cooperative.        Thought Content: Thought content normal.    Results for orders  placed or performed in visit on 08/03/22  CBC with Differential/Platelet  Result Value Ref Range   WBC 9.3 3.4 - 10.8 x10E3/uL   RBC 4.68 3.77 - 5.28 x10E6/uL   Hemoglobin 13.1 11.1 - 15.9 g/dL   Hematocrit 40.0 34.0 - 46.6 %   MCV 86 79 - 97 fL   MCH 28.0 26.6 - 33.0 pg   MCHC 32.8 31.5 - 35.7 g/dL   RDW 13.2 11.7 - 15.4 %   Platelets 230 150 - 450 x10E3/uL   Neutrophils 59 Not Estab. %   Lymphs 31 Not Estab. %   Monocytes 7 Not Estab. %   Eos 2 Not Estab. %   Basos 1 Not Estab. %   Neutrophils Absolute 5.5 1.4 - 7.0 x10E3/uL   Lymphocytes Absolute 2.9 0.7 - 3.1 x10E3/uL   Monocytes Absolute 0.6 0.1 - 0.9 x10E3/uL   EOS (ABSOLUTE) 0.2 0.0 - 0.4 x10E3/uL   Basophils Absolute 0.1 0.0 - 0.2 x10E3/uL   Immature Granulocytes 0 Not Estab. %   Immature Grans (Abs) 0.0 0.0 - 0.1 x10E3/uL  Comprehensive metabolic panel  Result Value Ref Range   Glucose 119 (H) 70 - 99 mg/dL   BUN 10 6 - 24 mg/dL   Creatinine, Ser 0.61 0.57 - 1.00 mg/dL   eGFR 112 >59 mL/min/1.73   BUN/Creatinine Ratio 16 9 - 23   Sodium 140 134 - 144 mmol/L   Potassium 4.2 3.5 - 5.2 mmol/L   Chloride 100 96 - 106 mmol/L   CO2 26 20 - 29 mmol/L   Calcium 9.5 8.7 - 10.2 mg/dL   Total Protein 6.8 6.0 - 8.5 g/dL   Albumin 3.9 3.9 - 4.9 g/dL   Globulin, Total 2.9 1.5 - 4.5 g/dL   Albumin/Globulin Ratio 1.3 1.2 - 2.2   Bilirubin Total 0.8 0.0 - 1.2 mg/dL   Alkaline Phosphatase 93 44 - 121 IU/L   AST 13 0 - 40 IU/L   ALT 6 0 - 32 IU/L  Lipase  Result Value Ref Range   Lipase 32 14 - 72 U/L  Amylase  Result Value Ref Range   Amylase 59 31 - 110 U/L  Allergy Panel 19, [employer]  Result Value Ref Range   Class Description Allergens Comment    Codfish IgE <0.10 Class 0 kU/L   F023-IgE Crab <0.10 Class 0 kU/L   Shrimp IgE <0.10 Class 0 kU/L   Tuna <0.10 Class 0 kU/L   Allergen  Salmon IgE <0.10 Class 0 kU/L   F080-IgE Lobster <0.10 Class 0 kU/L   Catfish <0.10 Class 0 kU/L      Assessment & Plan:    Problem List Items Addressed This Visit       Other   Heart burn    Acute and improved at this time with use of Pepcid daily.  Discussed with her she can change to PRN over upcoming weeks.  Focus on diet changes and avoid trigger foods when possible.  If any worsening symptoms alert provider.        Follow up plan: Return for Return as scheduled 10/04/22.

## 2022-08-11 NOTE — Assessment & Plan Note (Addendum)
Acute and improved at this time with use of Pepcid daily.  Discussed with her she can change to PRN over upcoming weeks.  Focus on diet changes and avoid trigger foods when possible.  If any worsening symptoms alert provider.

## 2022-08-30 ENCOUNTER — Other Ambulatory Visit: Payer: Self-pay | Admitting: Nurse Practitioner

## 2022-08-31 NOTE — Telephone Encounter (Signed)
Medication refill for Xyzal '5mg'$  last ov 08/11/22, upcoming ov 10/04/22 . Please advise

## 2022-09-09 NOTE — Progress Notes (Signed)
Cardiology Office Note  Date:  09/10/2022   ID:  Amy [name], DOB 1976-05-30, MRN [id number]  PCP:  Venita Lick, NP   Chief Complaint  Patient presents with   6 month follow up     "Doing well." Medications reviewed by the patient verbally.     HPI:  [name] is a 46 year old woman with past medical history of  morbid obesity Hypertension Diabetes Exertional tachycardia Who presents for follow-up of her tachycardia, cardiac risk factors  Last seen in clinic by myself November 2022 Seen by one of our providers March 2023  Recent trip to [place], was on a cruise Has 2 kids They do lots of traveling Planning a trip to [place]  Reports weight continues to run high but stable No regular walking or exercise program Significant tachycardia symptoms Rarely heart rate will spike up but not sustained Takes beta-blocker daily  Labs reviewed A1C 5.4 Total cholesterol 124 LDL 61 on crestor  EKG personally reviewed by myself on todays visit Normal sinus rhythm rate 63 bpm no significant ST-T wave changes checks pressure  PMH:   has a past medical history of Allergy, Asthma, Diabetes mellitus without complication ([place]), Essential hypertension, Obesity, Psoriasis, and Sinus tachycardia.  PSH:    Past Surgical History:  Procedure Laterality Date   CESAREAN SECTION     X 2   HAND SURGERY Right    THUMB   HERNIA REPAIR     right thumb surgery      Current Outpatient Medications  Medication Sig Dispense Refill   Blood Glucose Monitoring Suppl (ONETOUCH VERIO) w/Device KIT Use to check blood sugar once a day, first thing in morning fasting, with goal blood sugar < 130. 1 kit 0   glucose blood (ONETOUCH VERIO) test strip Use to check blood sugar once daily, fasting. 100 each 12   Lancets (ONETOUCH ULTRASOFT) lancets Use to check blood sugar once daily, fasting. 100 each 12   levocetirizine (XYZAL) 5 MG tablet Take 1 tablet by mouth once daily 90 tablet 4    levothyroxine (SYNTHROID) 50 MCG tablet Take 1 tablet (50 mcg total) by mouth daily. 90 tablet 4   losartan (COZAAR) 25 MG tablet Take 1 tablet (25 mg total) by mouth daily. 90 tablet 4   metFORMIN (GLUCOPHAGE) 500 MG tablet Start out taking 1 tablet (500 MG) by mouth daily at bedtime. 90 tablet 3   Multiple Vitamin (MULTIVITAMINS PO) Take 1 tablet by mouth daily.      nebivolol (BYSTOLIC) 5 MG tablet Take 1 tablet (5 mg total) by mouth daily. May take an extra 5 mg as needed for tachycardia 100 tablet 3   rosuvastatin (CRESTOR) 10 MG tablet Take 1 tablet (10 mg total) by mouth daily. 90 tablet 4   Vitamin D, Ergocalciferol, (DRISDOL) 1.25 MG (50000 UNIT) CAPS capsule Take 1 capsule (50,000 Units total) by mouth once a week. 12 capsule 4   No current facility-administered medications for this visit.     Allergies:   Biaxin [clarithromycin]   Social History:  The patient  reports that she quit smoking about 17 years ago. Her smoking use included cigarettes. She has a 2.50 pack-year smoking history. She has never used smokeless tobacco. She reports that she does not drink alcohol and does not use drugs.   Family History:   family history includes ADD / ADHD in her son; Allergies in her daughter; Anemia in her mother; Appendicitis in her paternal grandfather; Asthma in her daughter;  Autoimmune disease in her mother; Cirrhosis in her father; Congestive Heart Failure in her paternal grandmother; Diabetes in her maternal grandmother; Glaucoma in her father; Hypertension in her maternal grandmother and mother; Osteoporosis in her maternal grandmother; Thyroid disease in her maternal grandmother and mother.   Review of Systems: Review of Systems  Constitutional: Negative.   HENT: Negative.    Respiratory: Negative.    Cardiovascular:  Positive for palpitations.  Gastrointestinal: Negative.   Musculoskeletal: Negative.   Neurological: Negative.   Psychiatric/Behavioral: Negative.    All other  systems reviewed and are negative.   PHYSICAL EXAM: VS:  BP 110/70 (BP Location: Left Arm, Patient Position: Sitting, Cuff Size: Large)   Pulse 63   Ht 5' 2"  (1.575 m)   Wt (!) 321 lb (145.6 kg)   SpO2 96%   BMI 58.71 kg/m  , BMI Body mass index is 58.71 kg/m. Constitutional:  oriented to person, place, and time. No distress.  HENT:  Head: Grossly normal Eyes:  no discharge. No scleral icterus.  Neck: No JVD, no carotid bruits  Cardiovascular: Regular rate and rhythm, no murmurs appreciated Pulmonary/Chest: Clear to auscultation bilaterally, no wheezes or rails Abdominal: Soft.  no distension.  no tenderness.  Musculoskeletal: Normal range of motion Neurological:  normal muscle tone. Coordination normal. No atrophy Skin: Skin warm and dry Psychiatric: normal affect, pleasant  Recent Labs: 01/14/2022: TSH 2.600 08/03/2022: ALT 6; BUN 10; Creatinine, Ser 0.61; Hemoglobin 13.1; Platelets 230; Potassium 4.2; Sodium 140    Lipid Panel Lab Results  Component Value Date   CHOL 124 01/14/2022   HDL 45 01/14/2022   LDLCALC 61 01/14/2022   TRIG 93 01/14/2022      Wt Readings from Last 3 Encounters:  09/10/22 (!) 321 lb (145.6 kg)  08/11/22 (!) 327 lb (148.3 kg)  08/03/22 (!) 322 lb 12.8 oz (146.4 kg)     ASSESSMENT AND PLAN:  Problem List Items Addressed This Visit       Cardiology Problems   Hypertension associated with diabetes ([place])   Paroxysmal tachycardia ([place]) - Primary   Relevant Orders   EKG 12-Lead     Other   Type 2 diabetes mellitus with morbid obesity ([place])   Relevant Orders   EKG 12-Lead   Morbid obesity ([place])   Other Visit Diagnoses     Essential hypertension       Relevant Orders   EKG 12-Lead      Essential hypertension Blood pressure is well controlled on today's visit. No changes made to the medications.  Tachycardia Continue  Bystolic 5 mg daily, no significant arrhythmia  Morbid obesity We have encouraged continued exercise, careful  diet management in an effort to lose weight.  Anxiety , Active issues, stable  Prediabetes A1C improving now 5.4  Hyperlipidemia Cholesterol is at goal on the current lipid regimen. No changes to the medications were made.  On Crestor    Total encounter time more than 30 minutes  Greater than 50% was spent in counseling and coordination of care with the patient   Signed, Esmond Plants, M.D., Ph.D. [place], [place]

## 2022-09-10 ENCOUNTER — Encounter: Payer: Self-pay | Admitting: Cardiovascular Disease

## 2022-09-10 ENCOUNTER — Ambulatory Visit: Payer: Managed Care, Other (non HMO) | Attending: Cardiovascular Disease | Admitting: Cardiovascular Disease

## 2022-09-10 VITALS — BP 110/70 | HR 63 | Ht 62.0 in | Wt 321.0 lb

## 2022-09-10 DIAGNOSIS — I479 Paroxysmal tachycardia, unspecified: Secondary | ICD-10-CM | POA: Diagnosis not present

## 2022-09-10 DIAGNOSIS — I10 Essential (primary) hypertension: Secondary | ICD-10-CM

## 2022-09-10 DIAGNOSIS — E1159 Type 2 diabetes mellitus with other circulatory complications: Secondary | ICD-10-CM

## 2022-09-10 DIAGNOSIS — E1169 Type 2 diabetes mellitus with other specified complication: Secondary | ICD-10-CM | POA: Diagnosis not present

## 2022-09-10 DIAGNOSIS — I152 Hypertension secondary to endocrine disorders: Secondary | ICD-10-CM

## 2022-09-10 NOTE — Patient Instructions (Signed)
Medication Instructions:  No changes  If you need a refill on your cardiac medications before your next appointment, please call your pharmacy.   Lab work: No new labs needed  Testing/Procedures: No new testing needed  Follow-Up: At [facility], you and your health needs are our priority.  As part of our continuing mission to provide you with exceptional heart care, we have created designated Provider Care Teams.  These Care Teams include your primary Cardiologist (physician) and Advanced Practice Providers (APPs -  Physician Assistants and Nurse Practitioners) who all work together to provide you with the care you need, when you need it.  You will need a follow up appointment in 12 months, APP ok  Providers on your designated Care Team:   Murray Hodgkins, NP Christell Faith, PA-C Cadence Kathlen Mody, [place]  COVID-19 Vaccine Information can be found at: [email or web address].uk For questions related to vaccine distribution or appointments, please email [email or web address] or call [phone].

## 2022-10-03 NOTE — Patient Instructions (Incomplete)
Please call to schedule your mammogram and/or bone density: [facility]  Address: [address], [place], [place] [address] Phone: [phone]  [facility] [address] [place],  [place]  [address] Phone: [phone]    Diabetes Mellitus and Standards of Medical Care Living with and managing diabetes (diabetes mellitus) can be complicated. Your diabetes treatment may be managed by a team of health care providers, including: A physician who specializes in diabetes (endocrinologist). You might also have visits with a nurse practitioner or physician assistant. Nurses. A registered dietitian. A certified diabetes care and education specialist. An exercise specialist. A pharmacist. An eye doctor. A foot specialist (podiatrist). A dental care provider. A primary care provider. A mental health care provider. How to manage your diabetes You can do many things to successfully manage your diabetes. Your health care providers will follow guidelines to help you get the best quality of care. Here are general guidelines for your diabetes management plan. Your health care providers may give you more specific instructions. Physical exams When you are diagnosed with diabetes, and each year after that, your health care provider will ask about your medical and family history. You will have a physical exam, which may include: Measuring your height, weight, and body mass index (BMI). Checking your blood pressure. This will be done at every routine medical visit. Your target blood pressure may vary depending on your medical conditions, your age, and other factors. A thyroid exam. A skin exam. Screening for nerve damage (peripheral neuropathy). This may include checking the pulse in your legs and feet and the level of sensation in your hands and feet. A foot exam to inspect the structure and skin of your feet, including  checking for cuts, bruises, redness, blisters, sores, or other problems. Screening for blood vessel (vascular) problems. This may include checking the pulse in your legs and feet and checking your temperature. Blood tests Depending on your treatment plan and your personal needs, you may have the following tests: Hemoglobin A1C (HbA1C). This test provides information about blood sugar (glucose) control over the previous 2-3 months. It is used to adjust your treatment plan, if needed. This test will be done: At least 2 times a year, if you are meeting your treatment goals. 4 times a year, if you are not meeting your treatment goals or if your goals have changed. Lipid testing, including total cholesterol, LDL and HDL cholesterol, and triglyceride levels. The goal for LDL is less than 100 mg/dL (5.5 mmol/L). If you are at high risk for complications, the goal is less than 70 mg/dL (3.9 mmol/L). The goal for HDL is 40 mg/dL (2.2 mmol/L) or higher for men, and 50 mg/dL (2.8 mmol/L) or higher for women. An HDL cholesterol of 60 mg/dL (3.3 mmol/L) or higher gives some protection against heart disease. The goal for triglycerides is less than 150 mg/dL (8.3 mmol/L). Liver function tests. Kidney function tests. Thyroid function tests.  Dental and eye exams  Visit your dentist two times a year. If you have type 1 diabetes, your health care provider may recommend an eye exam within 5 years after you are diagnosed, and then once a year after your first exam. For children with type 1 diabetes, the health care provider may recommend an eye exam when your child is age 45 or older and has had diabetes for 3-5 years. After the first exam, your child should get an eye exam  once a year. If you have type 2 diabetes, your health care provider may recommend an eye exam as soon as you are diagnosed, and then every 1-2 years after your first exam. Immunizations A yearly flu (influenza) vaccine is recommended annually  for everyone 6 months or older. This is especially important if you have diabetes. The pneumonia (pneumococcal) vaccine is recommended for everyone 2 years or older who has diabetes. If you are age 75 or older, you may get the pneumonia vaccine as a series of two separate shots. The hepatitis B vaccine is recommended for adults shortly after being diagnosed with diabetes. Adults and children with diabetes should receive all other vaccines according to age-specific recommendations from the Centers for Disease Control and Prevention (CDC). Mental and emotional health Screening for symptoms of eating disorders, anxiety, and depression is recommended at the time of diagnosis and after as needed. If your screening shows that you have symptoms, you may need more evaluation. You may work with a mental health care provider. Follow these instructions at home: Treatment plan You will monitor your blood glucose levels and may give yourself insulin. Your treatment plan will be reviewed at every medical visit. You and your health care provider will discuss: How you are taking your medicines, including insulin. Any side effects you have. Your blood glucose level target goals. How often you monitor your blood glucose level. Lifestyle habits, such as activity level and tobacco, alcohol, and substance use. Education Your health care provider will assess how well you are monitoring your blood glucose levels and whether you are taking your insulin and medicines correctly. He or she may refer you to: A certified diabetes care and education specialist to manage your diabetes throughout your life, starting at diagnosis. A registered dietitian who can create and review your personal nutrition plan. An exercise specialist who can discuss your activity level and exercise plan. General instructions Take over-the-counter and prescription medicines only as told by your health care provider. Keep all follow-up visits. This  is important. Where to find support There are many diabetes support networks, including: American Diabetes Association (ADA): diabetes.org Defeat Diabetes Foundation: defeatdiabetes.org Where to find more information American Diabetes Association (ADA): www.diabetes.org Association of Diabetes Care & Education Specialists (ADCES): diabeteseducator.org International Diabetes Federation (IDF): http://hill.biz/ Summary Managing diabetes (diabetes mellitus) can be complicated. Your diabetes treatment may be managed by a team of health care providers. Your health care providers follow guidelines to help you get the best quality care. You should have physical exams, blood tests, blood pressure monitoring, immunizations, and screening tests regularly. Stay updated on how to manage your diabetes. Your health care providers may also give you more specific instructions based on your individual health. This information is not intended to replace advice given to you by your health care provider. Make sure you discuss any questions you have with your health care provider. Document Revised: 06/05/2020 Document Reviewed: 06/05/2020 Elsevier Patient Education  2023 [employer].

## 2022-10-04 ENCOUNTER — Encounter: Payer: Self-pay | Admitting: Nurse Practitioner

## 2022-10-04 ENCOUNTER — Ambulatory Visit (INDEPENDENT_AMBULATORY_CARE_PROVIDER_SITE_OTHER): Payer: Managed Care, Other (non HMO) | Admitting: Nurse Practitioner

## 2022-10-04 DIAGNOSIS — Z0000 Encounter for general adult medical examination without abnormal findings: Secondary | ICD-10-CM

## 2022-10-04 DIAGNOSIS — Z1231 Encounter for screening mammogram for malignant neoplasm of breast: Secondary | ICD-10-CM

## 2022-10-04 DIAGNOSIS — R12 Heartburn: Secondary | ICD-10-CM

## 2022-10-04 DIAGNOSIS — E1169 Type 2 diabetes mellitus with other specified complication: Secondary | ICD-10-CM | POA: Diagnosis not present

## 2022-10-04 DIAGNOSIS — Z1211 Encounter for screening for malignant neoplasm of colon: Secondary | ICD-10-CM | POA: Diagnosis not present

## 2022-10-04 DIAGNOSIS — E1159 Type 2 diabetes mellitus with other circulatory complications: Secondary | ICD-10-CM | POA: Diagnosis not present

## 2022-10-04 DIAGNOSIS — I479 Paroxysmal tachycardia, unspecified: Secondary | ICD-10-CM | POA: Diagnosis not present

## 2022-10-04 DIAGNOSIS — E538 Deficiency of other specified B group vitamins: Secondary | ICD-10-CM

## 2022-10-04 DIAGNOSIS — F419 Anxiety disorder, unspecified: Secondary | ICD-10-CM

## 2022-10-04 DIAGNOSIS — E039 Hypothyroidism, unspecified: Secondary | ICD-10-CM

## 2022-10-04 DIAGNOSIS — E559 Vitamin D deficiency, unspecified: Secondary | ICD-10-CM

## 2022-10-04 DIAGNOSIS — E785 Hyperlipidemia, unspecified: Secondary | ICD-10-CM

## 2022-10-04 DIAGNOSIS — Z23 Encounter for immunization: Secondary | ICD-10-CM

## 2022-10-04 DIAGNOSIS — R0683 Snoring: Secondary | ICD-10-CM

## 2022-10-04 DIAGNOSIS — I152 Hypertension secondary to endocrine disorders: Secondary | ICD-10-CM

## 2022-10-04 LAB — BAYER DCA HB A1C WAIVED: HB A1C (BAYER DCA - WAIVED): 5.9 % — ABNORMAL HIGH (ref 4.8–5.6)

## 2022-10-04 MED ORDER — LOSARTAN POTASSIUM 25 MG PO TABS: 25.0000 mg | ORAL_TABLET | Freq: Every day | ORAL | 4 refills | Status: DC

## 2022-10-04 MED ORDER — ROSUVASTATIN CALCIUM 10 MG PO TABS: 10.0000 mg | ORAL_TABLET | Freq: Every day | ORAL | 4 refills | Status: DC

## 2022-10-04 MED ORDER — LEVOTHYROXINE SODIUM 50 MCG PO TABS: 50.0000 ug | ORAL_TABLET | Freq: Every day | ORAL | 4 refills | Status: DC

## 2022-10-04 MED ORDER — METFORMIN HCL 500 MG PO TABS: 500.0000 mg | ORAL_TABLET | Freq: Every day | ORAL | 4 refills | Status: DC

## 2022-10-04 NOTE — Assessment & Plan Note (Signed)
Noted on past labs, is taking supplement, continue this and check level today.

## 2022-10-04 NOTE — Progress Notes (Signed)
BP 100/67   Pulse 65   Temp 98.3 F (36.8 C) (Oral)   Ht 5' 2.01" (1.575 m)   Wt (!) 322 lb (146.1 kg)   SpO2 98%   BMI 58.88 kg/m    Subjective:    Patient ID: Amy [name], female    DOB: [birth year], 46 y.o.   MRN: [id number]  HPI: Amy [name] is a 45 y.o. female presenting on 10/04/2022 for comprehensive medical examination. Current medical complaints include:none  She currently lives with: husband Menopausal Symptoms: no  DIABETES Taking Metformin 500 MG at night.  A1c 5.4% in June. Hypoglycemic episodes:no Polydipsia/polyuria: no Visual disturbance: no Chest pain: no Paresthesias: no Glucose Monitoring: yes             Accucheck frequency: Daily             Fasting glucose: 74 to 105             Post prandial:             Evening:             Before meals: Taking Insulin?: no             Long acting insulin:             Short acting insulin: Blood Pressure Monitoring: daily Retinal Examination: Up to Date -- Dr. Curley Spice Exam: Up to Date Pneumovax: Up to Date Influenza: Up to Date Aspirin: no    HYPERTENSION / HYPERLIPIDEMIA Taking Losartan 25 MG daily, Bystolic 5 MG daily, and Crestor 10 MG daily. Sees Dr. Rockey Situ with cardiology due to tachycardia -- last saw 09/10/22 with no changes. Satisfied with current treatment? yes Duration of hypertension: chronic BP monitoring frequency: daily BP range: 91/58 to 117/53 -- on average 100-110/50-60 BP medication side effects: no Past BP meds: as above Duration of hyperlipidemia: chronic Cholesterol medication side effects: no Cholesterol supplements: none Past cholesterol medications: none Medication compliance: good compliance Aspirin: no Recent stressors: no Recurrent headaches: no Visual changes: no Palpitations: no Dyspnea: no Chest pain: no Lower extremity edema: no Dizzy/lightheaded: no  The ASCVD Risk score (Arnett DK, et al., 2019) failed to calculate for the following reasons:    The valid total cholesterol range is 130 to 320 mg/dL    HYPOTHYROIDISM Continues on Levothyroxine 50 MCG.    Taking Vitamin D weekly with history of low levels.  Taking B12 supplement as level low in past. Thyroid control status:stable Satisfied with current treatment? yes Medication side effects: no Medication compliance: good compliance Etiology of hypothyroidism:  Recent dose adjustment:no Fatigue: no Cold intolerance: no Heat intolerance: no Weight gain: no Weight loss: no Constipation: no Diarrhea/loose stools: no Palpitations: no Lower extremity edema: no Anxiety/depressed mood: no   Depression Screen done today and results listed below:     08/11/2022   10:27 AM 05/25/2022    8:59 AM 01/14/2022    9:11 AM 10/28/2021    1:41 PM 10/02/2021    1:30 PM  Depression screen PHQ 2/9  Decreased Interest 0 0 0 1 0  Down, Depressed, Hopeless 0 0 0 0 0  PHQ - 2 Score 0 0 0 1 0  Altered sleeping 0 0 0 0 2  Tired, decreased energy 0 0 0 1 2  Change in appetite 0 0 0 0 1  Feeling bad or failure about yourself  0 0 0 0 0  Trouble concentrating 0 0 0 1 1  Moving slowly or fidgety/restless 0 0 0 0 0  Suicidal thoughts 0 0 0 0 0  PHQ-9 Score 0 0 0 3 6  Difficult doing work/chores Not difficult at all Not difficult at all Not difficult at all Not difficult at all Somewhat difficult   Functional Status Survey: Is the patient deaf or have difficulty hearing?: No Does the patient have difficulty seeing, even when wearing glasses/contacts?: No Does the patient have difficulty concentrating, remembering, or making decisions?: No Does the patient have difficulty walking or climbing stairs?: No Does the patient have difficulty dressing or bathing?: No Does the patient have difficulty doing errands alone such as visiting a doctor's office or shopping?: No      11/04/2020    9:19 AM 12/23/2020    9:07 AM 05/25/2022    8:59 AM 08/11/2022   10:25 AM 10/04/2022    8:58 AM  Fall Risk   Falls in the past year? 0 0 0 0 0  Was there an injury with Fall?   0 0 0  Fall Risk Category Calculator   0 0 0  Fall Risk Category   Low Low Low  Patient Fall Risk Level Low fall risk  Low fall risk Low fall risk Low fall risk  Patient at Risk for Falls Due to   No Fall Risks No Fall Risks No Fall Risks  Fall risk Follow up   Falls evaluation completed Falls evaluation completed Falls prevention discussed    Past Medical History:  Past Medical History:  Diagnosis Date   Allergy    Asthma    Diabetes mellitus without complication ([place])    Essential hypertension    Obesity    Psoriasis    Sinus tachycardia     Surgical History:  Past Surgical History:  Procedure Laterality Date   CESAREAN SECTION     X 2   HAND SURGERY Right    THUMB   HERNIA REPAIR     right thumb surgery      Medications:  Current Outpatient Medications on File Prior to Visit  Medication Sig   Blood Glucose Monitoring Suppl (ONETOUCH VERIO) w/Device KIT Use to check blood sugar once a day, first thing in morning fasting, with goal blood sugar < 130.   glucose blood (ONETOUCH VERIO) test strip Use to check blood sugar once daily, fasting.   Lancets (ONETOUCH ULTRASOFT) lancets Use to check blood sugar once daily, fasting.   levocetirizine (XYZAL) 5 MG tablet Take 1 tablet by mouth once daily   Multiple Vitamin (MULTIVITAMINS PO) Take 1 tablet by mouth daily.    nebivolol (BYSTOLIC) 5 MG tablet Take 1 tablet (5 mg total) by mouth daily. May take an extra 5 mg as needed for tachycardia   Vitamin D, Ergocalciferol, (DRISDOL) 1.25 MG (50000 UNIT) CAPS capsule Take 1 capsule (50,000 Units total) by mouth once a week.   No current facility-administered medications on file prior to visit.    Allergies:  Allergies  Allergen Reactions   Biaxin [Clarithromycin] Nausea And Vomiting    Social History:  Social History   Socioeconomic History   Marital status: Married    Spouse name: Not on file   Number  of children: Not on file   Years of education: Not on file   Highest education level: Not on file  Occupational History   Not on file  Tobacco Use   Smoking status: Former    Packs/day: 0.50    Years: 5.00  Total pack years: 2.50    Types: Cigarettes    Quit date: 01/04/2005    Years since quitting: 17.7   Smokeless tobacco: Never  Vaping Use   Vaping Use: Never used  Substance and Sexual Activity   Alcohol use: No    Alcohol/week: 0.0 standard drinks of alcohol   Drug use: No   Sexual activity: Not on file  Other Topics Concern   Not on file  Social History Narrative   Not on file   Social Determinants of Health   Financial Resource Strain: Not on file  Food Insecurity: Not on file  Transportation Needs: Not on file  Physical Activity: Not on file  Stress: Not on file  Social Connections: Not on file  Intimate Partner Violence: Not on file   Social History   Tobacco Use  Smoking Status Former   Packs/day: 0.50   Years: 5.00   Total pack years: 2.50   Types: Cigarettes   Quit date: 01/04/2005   Years since quitting: 17.7  Smokeless Tobacco Never   Social History   Substance and Sexual Activity  Alcohol Use No   Alcohol/week: 0.0 standard drinks of alcohol    Family History:  Family History  Problem Relation Age of Onset   Hypertension Mother    Thyroid disease Mother    Autoimmune disease Mother    Anemia Mother    Cirrhosis Father    Glaucoma Father    Thyroid disease Maternal Grandmother    Osteoporosis Maternal Grandmother    Hypertension Maternal Grandmother    Diabetes Maternal Grandmother    Congestive Heart Failure Paternal Grandmother    Appendicitis Paternal Grandfather    Asthma Daughter    Allergies Daughter    ADD / ADHD Son    Sleep apnea Neg Hx     Past medical history, surgical history, medications, allergies, family history and social history reviewed with patient today and changes made to appropriate areas of the chart.    ROS All other ROS negative except what is listed above and in the HPI.      Objective:    BP 100/67   Pulse 65   Temp 98.3 F (36.8 C) (Oral)   Ht 5' 2.01" (1.575 m)   Wt (!) 322 lb (146.1 kg)   SpO2 98%   BMI 58.88 kg/m   Wt Readings from Last 3 Encounters:  10/04/22 (!) 322 lb (146.1 kg)  09/10/22 (!) 321 lb (145.6 kg)  08/11/22 (!) 327 lb (148.3 kg)    Physical Exam Vitals and nursing note reviewed. Exam conducted with a chaperone present.  Constitutional:      General: She is awake. She is not in acute distress.    Appearance: She is well-developed and well-groomed. She is obese. She is not ill-appearing or toxic-appearing.  HENT:     Head: Normocephalic and atraumatic.     Right Ear: Hearing, tympanic membrane, ear canal and external ear normal. No drainage.     Left Ear: Hearing, tympanic membrane, ear canal and external ear normal. No drainage.     Nose: Nose normal.     Right Sinus: No maxillary sinus tenderness or frontal sinus tenderness.     Left Sinus: No maxillary sinus tenderness or frontal sinus tenderness.     Mouth/Throat:     Mouth: Mucous membranes are moist.     Pharynx: Oropharynx is clear. Uvula midline. No pharyngeal swelling, oropharyngeal exudate or posterior oropharyngeal erythema.  Eyes:  General: Lids are normal.        Right eye: No discharge.        Left eye: No discharge.     Extraocular Movements: Extraocular movements intact.     Conjunctiva/sclera: Conjunctivae normal.     Pupils: Pupils are equal, round, and reactive to light.     Visual Fields: Right eye visual fields normal and left eye visual fields normal.  Neck:     Thyroid: No thyromegaly.     Vascular: No carotid bruit.     Trachea: Trachea normal.  Cardiovascular:     Rate and Rhythm: Normal rate and regular rhythm.     Heart sounds: Normal heart sounds. No murmur heard.    No gallop.  Pulmonary:     Effort: Pulmonary effort is normal. No accessory muscle usage or  respiratory distress.     Breath sounds: Normal breath sounds.  Chest:  Breasts:    Right: Normal.     Left: Normal.  Abdominal:     General: Bowel sounds are normal.     Palpations: Abdomen is soft. There is no hepatomegaly or splenomegaly.     Tenderness: There is no abdominal tenderness.  Musculoskeletal:        General: Normal range of motion.     Cervical back: Normal range of motion and neck supple.     Right lower leg: No edema.     Left lower leg: No edema.  Lymphadenopathy:     Head:     Right side of head: No submental, submandibular, tonsillar, preauricular or posterior auricular adenopathy.     Left side of head: No submental, submandibular, tonsillar, preauricular or posterior auricular adenopathy.     Cervical: No cervical adenopathy.     Upper Body:     Right upper body: No supraclavicular, axillary or pectoral adenopathy.     Left upper body: No supraclavicular, axillary or pectoral adenopathy.  Skin:    General: Skin is warm and dry.     Capillary Refill: Capillary refill takes less than 2 seconds.     Findings: No rash.  Neurological:     Mental Status: She is alert and oriented to person, place, and time.     Gait: Gait is intact.     Deep Tendon Reflexes: Reflexes are normal and symmetric.     Reflex Scores:      Brachioradialis reflexes are 2+ on the right side and 2+ on the left side.      Patellar reflexes are 2+ on the right side and 2+ on the left side. Psychiatric:        Attention and Perception: Attention normal.        Mood and Affect: Mood normal.        Speech: Speech normal.        Behavior: Behavior normal. Behavior is cooperative.        Thought Content: Thought content normal.        Judgment: Judgment normal.    Results for orders placed or performed in visit on 08/03/22  CBC with Differential/Platelet  Result Value Ref Range   WBC 9.3 3.4 - 10.8 x10E3/uL   RBC 4.68 3.77 - 5.28 x10E6/uL   Hemoglobin 13.1 11.1 - 15.9 g/dL    Hematocrit 40.0 34.0 - 46.6 %   MCV 86 79 - 97 fL   MCH 28.0 26.6 - 33.0 pg   MCHC 32.8 31.5 - 35.7 g/dL   RDW 13.2 11.7 - 15.4 %  Platelets 230 150 - 450 x10E3/uL   Neutrophils 59 Not Estab. %   Lymphs 31 Not Estab. %   Monocytes 7 Not Estab. %   Eos 2 Not Estab. %   Basos 1 Not Estab. %   Neutrophils Absolute 5.5 1.4 - 7.0 x10E3/uL   Lymphocytes Absolute 2.9 0.7 - 3.1 x10E3/uL   Monocytes Absolute 0.6 0.1 - 0.9 x10E3/uL   EOS (ABSOLUTE) 0.2 0.0 - 0.4 x10E3/uL   Basophils Absolute 0.1 0.0 - 0.2 x10E3/uL   Immature Granulocytes 0 Not Estab. %   Immature Grans (Abs) 0.0 0.0 - 0.1 x10E3/uL  Comprehensive metabolic panel  Result Value Ref Range   Glucose 119 (H) 70 - 99 mg/dL   BUN 10 6 - 24 mg/dL   Creatinine, Ser 0.61 0.57 - 1.00 mg/dL   eGFR 112 >59 mL/min/1.73   BUN/Creatinine Ratio 16 9 - 23   Sodium 140 134 - 144 mmol/L   Potassium 4.2 3.5 - 5.2 mmol/L   Chloride 100 96 - 106 mmol/L   CO2 26 20 - 29 mmol/L   Calcium 9.5 8.7 - 10.2 mg/dL   Total Protein 6.8 6.0 - 8.5 g/dL   Albumin 3.9 3.9 - 4.9 g/dL   Globulin, Total 2.9 1.5 - 4.5 g/dL   Albumin/Globulin Ratio 1.3 1.2 - 2.2   Bilirubin Total 0.8 0.0 - 1.2 mg/dL   Alkaline Phosphatase 93 44 - 121 IU/L   AST 13 0 - 40 IU/L   ALT 6 0 - 32 IU/L  Lipase  Result Value Ref Range   Lipase 32 14 - 72 U/L  Amylase  Result Value Ref Range   Amylase 59 31 - 110 U/L  Allergy Panel 19, [employer]  Result Value Ref Range   Class Description Allergens Comment    Codfish IgE <0.10 Class 0 kU/L   F023-IgE Crab <0.10 Class 0 kU/L   Shrimp IgE <0.10 Class 0 kU/L   Tuna <0.10 Class 0 kU/L   Allergen Salmon IgE <0.10 Class 0 kU/L   F080-IgE Lobster <0.10 Class 0 kU/L   Catfish <0.10 Class 0 kU/L      Assessment & Plan:   Problem List Items Addressed This Visit       Cardiovascular and Mediastinum   Hypertension associated with diabetes (HCC)    Chronic, stable.  BP well below goal in office today and on home readings.   Continue Losartan 50 MG daily and Bystolic 5 MG to take at night. Tolerating medications well.  Will continue collaboration with cardiology. Recommend she monitor BP at least a few mornings a week at home and document.  DASH diet at home. Labs: CMP, CBC, TSH.       Relevant Medications   losartan (COZAAR) 25 MG tablet   metFORMIN (GLUCOPHAGE) 500 MG tablet   rosuvastatin (CRESTOR) 10 MG tablet   Other Relevant Orders   Bayer DCA Hb A1c Waived   CBC with Differential/Platelet   Comprehensive metabolic panel   TSH   Paroxysmal tachycardia (HCC)    Stable, continue Bystolic as ordered by Dr. Rockey Situ, cardiology, is tolerating well.  Will not increase at this time, although per cardiology note can consider increase to 5 MG BID or 10 MG nightly if needed in future.  Overall her HR is remaining stable at this time with no symptoms.      Relevant Medications   losartan (COZAAR) 25 MG tablet   rosuvastatin (CRESTOR) 10 MG tablet   Other Relevant Orders  Comprehensive metabolic panel   Lipid Panel w/o Chol/HDL Ratio     Endocrine   Hyperlipidemia associated with type 2 diabetes mellitus (HCC)    Chronic, stable.  Continue Rosuvastatin daily and adjust as needed.  Lipid panel obtained today.      Relevant Medications   losartan (COZAAR) 25 MG tablet   metFORMIN (GLUCOPHAGE) 500 MG tablet   rosuvastatin (CRESTOR) 10 MG tablet   Other Relevant Orders   Bayer DCA Hb A1c Waived   Comprehensive metabolic panel   Lipid Panel w/o Chol/HDL Ratio   Hypothyroid    Chronic, stable.  Continue current medication regimen and adjust as needed. Thyroid labs obtained today.        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  Continue current medication regimen and adjust as needed + continue  diet focus.  Continue to monitor BS daily.  Consider trial off medication if remains stable next visit.        Relevant Medications   losartan (COZAAR) 25 MG tablet   metFORMIN (GLUCOPHAGE) 500 MG tablet   rosuvastatin (CRESTOR) 10 MG tablet   Other Relevant Orders   Bayer DCA Hb A1c Waived     Other   B12 deficiency    Noted on past labs, is taking supplement, continue this and check level today.      Relevant Orders   Vitamin B12   Morbid obesity ([place])    BMI 58.88 with T2DM, HTN/HLD.  Recommended eating smaller high protein, low fat meals more frequently and exercising 30 mins a day 5 times a week with a goal of 10-15lb weight loss in the next 3 months. Patient voiced their understanding and motivation to adhere to these recommendations.       Relevant Medications   metFORMIN (GLUCOPHAGE) 500 MG tablet   Vitamin D deficiency    Ongoing, continue weekly supplement and recheck level today.  Could consider changing to daily Vitamin D3 if level improving.      Relevant Orders   VITAMIN D 25 Hydroxy (Vit-D Deficiency, Fractures)   Other Visit Diagnoses     Flu vaccine need       Flu vaccine in office today.   Relevant Orders   Flu Vaccine QUAD 6+ mos PF IM (Fluarix Quad PF) (Completed)   Encounter for screening mammogram for malignant neoplasm of breast       Mammogram ordered today.   Relevant Orders   MM 3D SCREEN BREAST BILATERAL   Colon cancer screening       GI referral placed and discussed with patient.   Relevant Orders   Ambulatory referral to Gastroenterology   Encounter for annual physical exam       Annual physical today with labs and health maintenance reviewed, discussed with patient.        Follow up plan: Return in about 6 months (around 04/05/2023) for T2DM, HTN/HLD, VIT D.   LABORATORY TESTING:  - Pap smear: up to date  IMMUNIZATIONS:   - Tdap: Tetanus vaccination status reviewed: last tetanus booster within 10 years. - Influenza: Up to  date - Pneumovax: Not applicable - Prevnar: Not applicable - COVID: Refuses - HPV: Not applicable - Shingrix vaccine: Not applicable  SCREENING: -Mammogram:  Ordered today  - Colonoscopy: Ordered today  - Bone Density: Not applicable  -Hearing Test: Not applicable  -Spirometry: Not applicable   PATIENT COUNSELING:   Advised to take 1 mg of folate supplement per day if capable of pregnancy.   Sexuality: Discussed sexually transmitted diseases, partner selection, use of condoms, avoidance of unintended pregnancy  and contraceptive alternatives.   Advised to avoid cigarette smoking.  I discussed with the patient that most people either abstain from alcohol or drink within safe limits (<=14/week and <=4 drinks/occasion for males, <=7/weeks and <= 3 drinks/occasion for females) and that the risk for alcohol disorders and other health effects rises proportionally with the number of drinks per week and how often a drinker exceeds daily limits.  Discussed cessation/primary prevention of drug use and availability of treatment for abuse.   Diet: Encouraged to adjust caloric intake to maintain  or achieve ideal body weight, to reduce intake of dietary saturated fat and total fat, to limit sodium intake by avoiding high sodium foods and not adding table salt, and to maintain adequate dietary potassium and calcium preferably from fresh fruits, vegetables, and low-fat dairy products.    Stressed the importance of regular exercise  Injury prevention: Discussed safety belts, safety helmets, smoke detector, smoking near bedding or upholstery.   Dental health: Discussed importance of regular tooth brushing, flossing, and dental visits.    NEXT PREVENTATIVE PHYSICAL DUE IN 1 YEAR. Return in about 6 months (around 04/05/2023) for T2DM, HTN/HLD, VIT D.         ,

## 2022-10-04 NOTE — Assessment & Plan Note (Signed)
Stable, continue Bystolic as ordered by Dr. Rockey Situ, cardiology, is tolerating well.  Will not increase at this time, although per cardiology note can consider increase to 5 MG BID or 10 MG nightly if needed in future.  Overall her HR is remaining stable at this time with no symptoms.

## 2022-10-04 NOTE — Assessment & Plan Note (Signed)
Diagnosed in October 2021 -- A1c 5.9% at this time and urine ALB 30 February 2023.  Continue current medication regimen and adjust as needed + continue diet focus.  Continue to monitor BS daily.  Consider trial off medication if remains stable next visit.

## 2022-10-04 NOTE — Assessment & Plan Note (Signed)
Ongoing, continue weekly supplement and recheck level today.  Could consider changing to daily Vitamin D3 if level improving.

## 2022-10-04 NOTE — Assessment & Plan Note (Signed)
Chronic, stable.  Continue current medication regimen and adjust as needed. Thyroid labs obtained today.

## 2022-10-04 NOTE — Assessment & Plan Note (Signed)
Chronic, stable.  Continue Rosuvastatin daily and adjust as needed.  Lipid panel obtained today.

## 2022-10-04 NOTE — Assessment & Plan Note (Signed)
Chronic, stable.  BP well below goal in office today and on home readings.  Continue Losartan 50 MG daily and Bystolic 5 MG to take at night. Tolerating medications well.  Will continue collaboration with cardiology. Recommend she monitor BP at least a few mornings a week at home and document.  DASH diet at home. Labs: CMP, CBC, TSH.

## 2022-10-04 NOTE — Assessment & Plan Note (Signed)
BMI 58.88 with T2DM, HTN/HLD.  Recommended eating smaller high protein, low fat meals more frequently and exercising 30 mins a day 5 times a week with a goal of 10-15lb weight loss in the next 3 months. Patient voiced their understanding and motivation to adhere to these recommendations.

## 2022-10-05 ENCOUNTER — Telehealth: Payer: Self-pay

## 2022-10-05 ENCOUNTER — Other Ambulatory Visit: Payer: Self-pay

## 2022-10-05 DIAGNOSIS — Z1211 Encounter for screening for malignant neoplasm of colon: Secondary | ICD-10-CM

## 2022-10-05 LAB — VITAMIN D 25 HYDROXY (VIT D DEFICIENCY, FRACTURES): Vit D, 25-Hydroxy: 51.5 ng/mL (ref 30.0–100.0)

## 2022-10-05 LAB — COMPREHENSIVE METABOLIC PANEL
ALT: 5 IU/L (ref 0–32)
AST: 11 IU/L (ref 0–40)
Albumin/Globulin Ratio: 1.3 (ref 1.2–2.2)
Albumin: 3.9 g/dL (ref 3.9–4.9)
Alkaline Phosphatase: 92 IU/L (ref 44–121)
BUN/Creatinine Ratio: 14 (ref 9–23)
BUN: 10 mg/dL (ref 6–24)
Bilirubin Total: 0.7 mg/dL (ref 0.0–1.2)
CO2: 27 mmol/L (ref 20–29)
Calcium: 9.5 mg/dL (ref 8.7–10.2)
Chloride: 102 mmol/L (ref 96–106)
Creatinine, Ser: 0.72 mg/dL (ref 0.57–1.00)
Globulin, Total: 3.1 g/dL (ref 1.5–4.5)
Glucose: 92 mg/dL (ref 70–99)
Potassium: 4.6 mmol/L (ref 3.5–5.2)
Sodium: 141 mmol/L (ref 134–144)
Total Protein: 7 g/dL (ref 6.0–8.5)
eGFR: 104 mL/min/{1.73_m2} (ref >59)

## 2022-10-05 LAB — CBC WITH DIFFERENTIAL/PLATELET
Basophils Absolute: 0.1 10*3/uL (ref 0.0–0.2)
Basos: 1 %
EOS (ABSOLUTE): 0.2 10*3/uL (ref 0.0–0.4)
Eos: 2 %
Hematocrit: 42.1 % (ref 34.0–46.6)
Hemoglobin: 13.1 g/dL (ref 11.1–15.9)
Immature Grans (Abs): 0 10*3/uL (ref 0.0–0.1)
Immature Granulocytes: 0 %
Lymphocytes Absolute: 2.8 10*3/uL (ref 0.7–3.1)
Lymphs: 32 %
MCH: 27.5 pg (ref 26.6–33.0)
MCHC: 31.1 g/dL — ABNORMAL LOW (ref 31.5–35.7)
MCV: 88 fL (ref 79–97)
Monocytes Absolute: 0.7 10*3/uL (ref 0.1–0.9)
Monocytes: 8 %
Neutrophils Absolute: 5 10*3/uL (ref 1.4–7.0)
Neutrophils: 57 %
Platelets: 246 10*3/uL (ref 150–450)
RBC: 4.77 x10E6/uL (ref 3.77–5.28)
RDW: 13.2 % (ref 11.7–15.4)
WBC: 8.8 10*3/uL (ref 3.4–10.8)

## 2022-10-05 LAB — TSH: TSH: 2.51 u[IU]/mL (ref 0.450–4.500)

## 2022-10-05 LAB — T4, FREE: Free T4: 1.49 ng/dL (ref 0.82–1.77)

## 2022-10-05 LAB — LIPID PANEL W/O CHOL/HDL RATIO
Cholesterol, Total: 129 mg/dL (ref 100–199)
HDL: 55 mg/dL (ref >39)
LDL Chol Calc (NIH): 58 mg/dL (ref 0–99)
Triglycerides: 79 mg/dL (ref 0–149)
VLDL Cholesterol Cal: 16 mg/dL (ref 5–40)

## 2022-10-05 LAB — VITAMIN B12: Vitamin B-12: 512 pg/mL (ref 232–1245)

## 2022-10-05 MED ORDER — NA SULFATE-K SULFATE-MG SULF 17.5-3.13-1.6 GM/177ML PO SOLN: 1.0000 | Freq: Once | ORAL | 0 refills | Status: AC

## 2022-10-05 NOTE — Progress Notes (Signed)
Contacted via MyChart   Good morning [name], your labs have returned and overall look fantastic.  I have no concerns on these.  Continue all current medications.  Any questions? Keep being amazing!!  Thank you for allowing me to participate in your care.  I appreciate you. Kindest regards, Miken Stecher

## 2022-10-05 NOTE — Telephone Encounter (Signed)
Gastroenterology Pre-Procedure Review  Request Date: 10/27/22 Requesting Physician: Dr. Marius Ditch  PATIENT REVIEW QUESTIONS: The patient responded to the following health history questions as indicated:    1. Are you having any GI issues? no 2. Do you have a personal history of Polyps? no 3. Do you have a family history of Colon Cancer or Polyps? no 4. Diabetes Mellitus? yes (type 2 patient has been advised to hold Metformin 2 days prior to colonoscopy) 5. Joint replacements in the past 12 months?no 6. Major health problems in the past 3 months?no 7. Any artificial heart valves, MVP, or defibrillator? Patient does have Tachycardia.  Cardiac request will be sent to Dr. Gwenyth Ober office.    MEDICATIONS & ALLERGIES:    Patient reports the following regarding taking any anticoagulation/antiplatelet therapy:   Plavix, Coumadin, Eliquis, Xarelto, Lovenox, Pradaxa, Brilinta, or Effient? no Aspirin? no  Patient confirms/reports the following medications:  Current Outpatient Medications  Medication Sig Dispense Refill   Blood Glucose Monitoring Suppl (ONETOUCH VERIO) w/Device KIT Use to check blood sugar once a day, first thing in morning fasting, with goal blood sugar < 130. 1 kit 0   glucose blood (ONETOUCH VERIO) test strip Use to check blood sugar once daily, fasting. 100 each 12   Lancets (ONETOUCH ULTRASOFT) lancets Use to check blood sugar once daily, fasting. 100 each 12   levocetirizine (XYZAL) 5 MG tablet Take 1 tablet by mouth once daily 90 tablet 4   levothyroxine (SYNTHROID) 50 MCG tablet Take 1 tablet (50 mcg total) by mouth daily. 90 tablet 4   losartan (COZAAR) 25 MG tablet Take 1 tablet (25 mg total) by mouth daily. 90 tablet 4   metFORMIN (GLUCOPHAGE) 500 MG tablet Take 1 tablet (500 mg total) by mouth at bedtime. 90 tablet 4   Multiple Vitamin (MULTIVITAMINS PO) Take 1 tablet by mouth daily.      nebivolol (BYSTOLIC) 5 MG tablet Take 1 tablet (5 mg total) by mouth daily. May take  an extra 5 mg as needed for tachycardia 100 tablet 3   rosuvastatin (CRESTOR) 10 MG tablet Take 1 tablet (10 mg total) by mouth daily. 90 tablet 4   Vitamin D, Ergocalciferol, (DRISDOL) 1.25 MG (50000 UNIT) CAPS capsule Take 1 capsule (50,000 Units total) by mouth once a week. 12 capsule 4   No current facility-administered medications for this visit.    Patient confirms/reports the following allergies:  Allergies  Allergen Reactions   Biaxin [Clarithromycin] Nausea And Vomiting    No orders of the defined types were placed in this encounter.   AUTHORIZATION INFORMATION Primary Insurance: 1D#: Group #:  Secondary Insurance: 1D#: Group #:  SCHEDULE INFORMATION: Date: 10/27/22 Time: Location: [facility]

## 2022-10-08 ENCOUNTER — Telehealth: Payer: Self-pay

## 2022-10-08 NOTE — Telephone Encounter (Signed)
[employer] manager reached out and spoke with patient in regards to requested paperwork. Patient was notified that it has been faxed back over and requested a mail copy. Copy has been mailed.

## 2022-10-08 NOTE — Telephone Encounter (Signed)
Copied from [place] [phone]. Topic: General - Other >> Oct 06, 2022  3:24 PM [place] B wrote: Reason for CRM: Patient called in about paperwork she left on Monday, for [employer] screening for work. She is checking the status if filled out yet. Please call back

## 2022-10-15 ENCOUNTER — Encounter: Payer: Self-pay | Admitting: Cardiovascular Disease

## 2022-10-15 ENCOUNTER — Telehealth: Payer: Self-pay | Admitting: Cardiovascular Disease

## 2022-10-15 MED ORDER — NEBIVOLOL HCL 5 MG PO TABS: 5.0000 mg | ORAL_TABLET | Freq: Every day | ORAL | 3 refills | Status: DC

## 2022-10-15 NOTE — Telephone Encounter (Signed)
*  STAT* If patient is at the pharmacy, call can be transferred to refill team.   1. Which medications need to be refilled? (please list name of each medication and dose if known)  nebivolol (BYSTOLIC) 5 MG tablet   2. Which pharmacy/location (including street and city if local pharmacy) is medication to be sent to? [facility] ([facility]), [place] - [address]   3. Do they need a 30 day or 90 day supply? 90 day  Patient is completely out of medication.

## 2022-11-17 ENCOUNTER — Encounter: Payer: Self-pay | Admitting: Gastroenterology

## 2022-11-17 ENCOUNTER — Ambulatory Visit: Payer: Managed Care, Other (non HMO) | Admitting: Anesthesiology

## 2022-11-17 ENCOUNTER — Encounter
Admission: RE | Disposition: A | Discharge status: Home / Self Care | Payer: Self-pay | Source: Home / Self Care | Attending: Gastroenterology

## 2022-11-17 ENCOUNTER — Other Ambulatory Visit: Payer: Self-pay

## 2022-11-17 ENCOUNTER — Ambulatory Visit
Admission: RE | Admit: 2022-11-17 | Discharge: 2022-11-17 | Disposition: A | Discharge status: Home / Self Care | Payer: Managed Care, Other (non HMO) | Attending: Gastroenterology | Admitting: Gastroenterology

## 2022-11-17 DIAGNOSIS — D124 Benign neoplasm of descending colon: Secondary | ICD-10-CM | POA: Diagnosis not present

## 2022-11-17 DIAGNOSIS — D120 Benign neoplasm of cecum: Secondary | ICD-10-CM | POA: Diagnosis not present

## 2022-11-17 DIAGNOSIS — Z1211 Encounter for screening for malignant neoplasm of colon: Secondary | ICD-10-CM | POA: Insufficient documentation

## 2022-11-17 DIAGNOSIS — D125 Benign neoplasm of sigmoid colon: Secondary | ICD-10-CM | POA: Insufficient documentation

## 2022-11-17 HISTORY — PX: COLONOSCOPY WITH PROPOFOL: SHX5780

## 2022-11-17 LAB — POCT PREGNANCY, URINE: Preg Test, Ur: NEGATIVE

## 2022-11-17 LAB — GLUCOSE, CAPILLARY: Glucose-Capillary: 84 mg/dL (ref 70–99)

## 2022-11-17 SURGERY — COLONOSCOPY WITH PROPOFOL
Anesthesia: General

## 2022-11-17 MED ORDER — PROPOFOL 500 MG/50ML IV EMUL
INTRAVENOUS | Status: DC | PRN
  Administered 2022-11-17: 145 ug/kg/min via INTRAVENOUS

## 2022-11-17 MED ORDER — GLYCOPYRROLATE 0.2 MG/ML IJ SOLN
INTRAMUSCULAR | Status: DC | PRN
  Administered 2022-11-17: .2 mg via INTRAVENOUS

## 2022-11-17 MED ORDER — PROPOFOL 10 MG/ML IV BOLUS
INTRAVENOUS | Status: DC | PRN
  Administered 2022-11-17 (×2): 10 mg via INTRAVENOUS
  Administered 2022-11-17: 60 mg via INTRAVENOUS
  Administered 2022-11-17: 10 mg via INTRAVENOUS

## 2022-11-17 MED ORDER — DEXMEDETOMIDINE HCL IN NACL 200 MCG/50ML IV SOLN
INTRAVENOUS | Status: DC | PRN
  Administered 2022-11-17 (×2): 12 ug via INTRAVENOUS

## 2022-11-17 MED ORDER — LIDOCAINE HCL (CARDIAC) PF 100 MG/5ML IV SOSY
PREFILLED_SYRINGE | INTRAVENOUS | Status: DC | PRN
  Administered 2022-11-17: 100 mg via INTRAVENOUS

## 2022-11-17 MED ORDER — SODIUM CHLORIDE 0.9 % IV SOLN: INTRAVENOUS | Status: DC

## 2022-11-17 NOTE — Anesthesia Postprocedure Evaluation (Signed)
Anesthesia Post Note  Patient: Amy [name]  Procedure(s) Performed: COLONOSCOPY WITH PROPOFOL  Patient location during evaluation: Endoscopy Anesthesia Type: General Level of consciousness: awake and alert Pain management: pain level controlled Vital Signs Assessment: post-procedure vital signs reviewed and stable Respiratory status: spontaneous breathing, nonlabored ventilation, respiratory function stable and patient connected to nasal cannula oxygen Cardiovascular status: blood pressure returned to baseline and stable Postop Assessment: no apparent nausea or vomiting Anesthetic complications: no  No notable events documented.   Last Vitals:  Vitals:   11/17/22 0921  BP: (!) 114/58  Pulse: 71  Resp: 18  Temp: (!) 35.8 C  SpO2: 100%    Last Pain:  Vitals:   11/17/22 0921  TempSrc: Temporal  PainSc: 0-No pain                 Ilene Qua

## 2022-11-17 NOTE — Anesthesia Procedure Notes (Signed)
Procedure Name: General with mask airway Date/Time: 11/17/2022 10:15 AM  Performed by: Kelton Pillar, CRNAPre-anesthesia Checklist: Patient identified, Emergency Drugs available, Suction available and Patient being monitored Patient Re-evaluated:Patient Re-evaluated prior to induction Oxygen Delivery Method: Simple face mask Induction Type: IV induction Placement Confirmation: positive ETCO2, CO2 detector and breath sounds checked- equal and bilateral Dental Injury: Teeth and Oropharynx as per pre-operative assessment

## 2022-11-17 NOTE — Transfer of Care (Signed)
Immediate Anesthesia Transfer of Care Note  Patient: Amy [name]  Procedure(s) Performed: COLONOSCOPY WITH PROPOFOL  Patient Location: Endoscopy Unit  Anesthesia Type:General  Level of Consciousness: awake, drowsy, and patient cooperative  Airway & Oxygen Therapy: Patient Spontanous Breathing and Patient connected to face mask oxygen  Post-op Assessment: Report given to RN and Post -op Vital signs reviewed and stable  Post vital signs: Reviewed and stable  Last Vitals:  Vitals Value Taken Time  BP 129/71 11/17/22 1030  Temp 35.9 C 11/17/22 1010  Pulse 57 11/17/22 1039  Resp 15 11/17/22 1039  SpO2 100 % 11/17/22 1039  Vitals shown include unvalidated device data.  Last Pain:  Vitals:   11/17/22 1020  TempSrc:   PainSc: 0-No pain         Complications: No notable events documented.

## 2022-11-17 NOTE — H&P (Signed)
Cephas Darby, MD [address]  [place]  [place],  [address]  Main: [phone]  Fax: [phone] Pager: [phone]  Primary Care Physician:  Venita Lick, NP Primary Gastroenterologist:  Dr. Cephas Darby  Pre-Procedure History & Physical: HPI:  Amy [name] is a 46 y.o. female is here for an colonoscopy.   Past Medical History:  Diagnosis Date   Allergy    Asthma    Diabetes mellitus without complication ([place])    Essential hypertension    Obesity    Psoriasis    Sinus tachycardia     Past Surgical History:  Procedure Laterality Date   CESAREAN SECTION     X 2   HAND SURGERY Right    THUMB   HERNIA REPAIR     right thumb surgery      Prior to Admission medications   Medication Sig Start Date End Date Taking? Authorizing Provider  levothyroxine (SYNTHROID) 50 MCG tablet Take 1 tablet (50 mcg total) by mouth daily. 10/04/22  Yes Cannady, Jolene T, NP  losartan (COZAAR) 25 MG tablet Take 1 tablet (25 mg total) by mouth daily. 10/04/22  Yes Cannady, Jolene T, NP  metFORMIN (GLUCOPHAGE) 500 MG tablet Take 1 tablet (500 mg total) by mouth at bedtime. 10/04/22  Yes Cannady, Henrine Screws T, NP  Multiple Vitamin (MULTIVITAMINS PO) Take 1 tablet by mouth daily.    Yes [provider]  nebivolol (BYSTOLIC) 5 MG tablet Take 1 tablet (5 mg total) by mouth daily. May take an extra 5 mg as needed for tachycardia 10/15/22  Yes Gollan, Kathlene November, MD  rosuvastatin (CRESTOR) 10 MG tablet Take 1 tablet (10 mg total) by mouth daily. 10/04/22  Yes Cannady, Jolene T, NP  Vitamin D, Ergocalciferol, (DRISDOL) 1.25 MG (50000 UNIT) CAPS capsule Take 1 capsule (50,000 Units total) by mouth once a week. 01/14/22  Yes Cannady, Henrine Screws T, NP  Blood Glucose Monitoring Suppl (ONETOUCH VERIO) w/Device KIT Use to check blood sugar once a day, first thing in morning fasting, with goal blood sugar < 130. 10/03/20   Cannady, Jolene T, NP  glucose blood (ONETOUCH VERIO) test  strip Use to check blood sugar once daily, fasting. 10/03/20   Cannady, Henrine Screws T, NP  Lancets (ONETOUCH ULTRASOFT) lancets Use to check blood sugar once daily, fasting. 10/03/20   Marnee Guarneri T, NP  levocetirizine (XYZAL) 5 MG tablet Take 1 tablet by mouth once daily 08/31/22   Marnee Guarneri T, NP    Allergies as of 10/05/2022 - Review Complete 10/05/2022  Allergen Reaction Noted   Biaxin [clarithromycin] Nausea And Vomiting 09/26/2013    Family History  Problem Relation Age of Onset   Hypertension Mother    Thyroid disease Mother    Autoimmune disease Mother    Anemia Mother    Cirrhosis Father    Glaucoma Father    Thyroid disease Maternal Grandmother    Osteoporosis Maternal Grandmother    Hypertension Maternal Grandmother    Diabetes Maternal Grandmother    Congestive Heart Failure Paternal Grandmother    Appendicitis Paternal Grandfather    Asthma Daughter    Allergies Daughter    ADD / ADHD Son    Sleep apnea Neg Hx     Social History   Socioeconomic History   Marital status: Married    Spouse name: Not on file   Number of children: Not on file   Years of education: Not on file   Highest education level: Not  on file  Occupational History   Not on file  Tobacco Use   Smoking status: Former    Packs/day: 0.50    Years: 5.00    Total pack years: 2.50    Types: Cigarettes    Quit date: 01/04/2005    Years since quitting: 17.8   Smokeless tobacco: Never  Vaping Use   Vaping Use: Never used  Substance and Sexual Activity   Alcohol use: No    Alcohol/week: 0.0 standard drinks of alcohol   Drug use: No   Sexual activity: Not on file  Other Topics Concern   Not on file  Social History Narrative   Not on file   Social Determinants of Health   Financial Resource Strain: Not on file  Food Insecurity: Not on file  Transportation Needs: Not on file  Physical Activity: Not on file  Stress: Not on file  Social Connections: Not on file  Intimate Partner  Violence: Not on file    Review of Systems: See HPI, otherwise negative ROS  Physical Exam: BP (!) 114/58   Pulse 71   Temp (!) 96.5 F (35.8 C) (Temporal)   Resp 18   Ht _0  (1.575 m)   Wt (!) 143.8 kg   SpO2 100%   BMI 57.98 kg/m  General:   Alert,  pleasant and cooperative in NAD Head:  Normocephalic and atraumatic. Neck:  Supple; no masses or thyromegaly. Lungs:  Clear throughout to auscultation.    Heart:  Regular rate and rhythm. Abdomen:  Soft, nontender and nondistended. Normal bowel sounds, without guarding, and without rebound.   Neurologic:  Alert and  oriented x4;  grossly normal neurologically.  Impression/Plan: Amy [name] is here for an colonoscopy to be performed for colon cancer screening  Risks, benefits, limitations, and alternatives regarding  colonoscopy have been reviewed with the patient.  Questions have been answered.  All parties agreeable.   Sherri Sear, MD  11/17/2022, 9:32 AM

## 2022-11-17 NOTE — Op Note (Signed)
[facility]enter[facility] Patient Name: Amy [name] Procedure Date: 11/17/2022 9:41 AM MRN: [id number] Account #: [id number] Date of Birth: [birth year] Admit Type: Outpatient Age: 46 Room: [facility] ENDO ROOM 4 Gender: Female Note Status: Finalized Instrument Name: Jasper Riling [id number] Procedure:             Colonoscopy Indications:           Screening for colorectal malignant neoplasm, This is                         the patient's first colonoscopy Providers:             Lin Landsman MD, MD Medicines:             General Anesthesia Complications:         No immediate complications. Estimated blood loss: None. Procedure:             Pre-Anesthesia Assessment:                        - Prior to the procedure, a History and Physical was                         performed, and patient medications and allergies were                         reviewed. The patient is competent. The risks and                         benefits of the procedure and the sedation options and                         risks were discussed with the patient. All questions                         were answered and informed consent was obtained.                         Patient identification and proposed procedure were                         verified by the physician, the nurse, the                         anesthesiologist, the anesthetist and the technician                         in the pre-procedure area in the procedure room in the                         endoscopy suite. Mental Status Examination: alert and                         oriented. Airway Examination: normal oropharyngeal                         airway and neck mobility. Respiratory Examination:                         clear to auscultation.  CV Examination: normal.                         Prophylactic Antibiotics: The patient does not require                         prophylactic antibiotics. Prior Anticoagulants: The                          patient has taken no anticoagulant or antiplatelet                         agents. ASA Grade Assessment: III - A patient with                         severe systemic disease. After reviewing the risks and                         benefits, the patient was deemed in satisfactory                         condition to undergo the procedure. The anesthesia                         plan was to use general anesthesia. Immediately prior                         to administration of medications, the patient was                         re-assessed for adequacy to receive sedatives. The                         heart rate, respiratory rate, oxygen saturations,                         blood pressure, adequacy of pulmonary ventilation, and                         response to care were monitored throughout the                         procedure. The physical status of the patient was                         re-assessed after the procedure.                        After obtaining informed consent, the colonoscope was                         passed under direct vision. Throughout the procedure,                         the patient's blood pressure, pulse, and oxygen                         saturations were monitored continuously. The  Colonoscope was introduced through the anus and                         advanced to the the cecum, identified by appendiceal                         orifice and ileocecal valve. The colonoscopy was                         performed without difficulty. The patient tolerated                         the procedure well. The quality of the bowel                         preparation was evaluated using the BBPS [facility] Bowel                         Preparation Scale) with scores of: Right Colon = 3,                         Transverse Colon = 3 and Left Colon = 3 (entire mucosa                         seen well with no residual staining, small fragments                          of stool or opaque liquid). The total BBPS score                         equals 9. The ileocecal valve, appendiceal orifice,                         and rectum were photographed. Findings:      The perianal and digital rectal examinations were normal. Pertinent       negatives include normal sphincter tone and no palpable rectal lesions.      A diminutive polyp was found in the cecum. The polyp was sessile. The       polyp was removed with a cold biopsy forceps. Resection and retrieval       were complete.      Three sessile polyps were found in the sigmoid colon and descending       colon. The polyps were 3 to 4 mm in size. These polyps were removed with       a cold snare. Resection and retrieval were complete. Estimated blood       loss: none.      The retroflexed view of the distal rectum and anal verge was normal and       showed no anal or rectal abnormalities. Impression:            - One diminutive polyp in the cecum, removed with a                         cold biopsy forceps. Resected and retrieved.                        - Three 3  to 4 mm polyps in the sigmoid colon and in                         the descending colon, removed with a cold snare.                         Resected and retrieved.                        - The distal rectum and anal verge are normal on                         retroflexion view. Recommendation:        - Discharge patient to home (with escort).                        - Resume previous diet today.                        - Continue present medications.                        - Await pathology results.                        - Repeat colonoscopy in 5-10 years for surveillance                         based on pathology results. Procedure Code(s):     --- Professional ---                        [phone], Colonoscopy, flexible; with removal of                         tumor(s), polyp(s), or other lesion(s) by snare                          technique                        [id number], 98, Colonoscopy, flexible; with biopsy, single                         or multiple Diagnosis Code(s):     --- Professional ---                        Z12.11, Encounter for screening for malignant neoplasm                         of colon                        D12.0, Benign neoplasm of cecum                        D12.5, Benign neoplasm of sigmoid colon                        D12.4, Benign neoplasm of descending colon CPT copyright 2022 American Medical Association. All rights reserved. The codes documented in this  report are preliminary and upon coder review may  be revised to meet current compliance requirements. Dr. Ulyess Mort Lin Landsman MD, MD 11/17/2022 10:09:17 AM This report has been signed electronically. Number of Addenda: 0 Note Initiated On: 11/17/2022 9:41 AM Scope Withdrawal Time: 0 hours 12 minutes 18 seconds  Total Procedure Duration: 0 hours 15 minutes 39 seconds  Estimated Blood Loss:  Estimated blood loss: none.      [facility]

## 2022-11-17 NOTE — Anesthesia Preprocedure Evaluation (Signed)
Anesthesia Evaluation  Patient identified by MRN, date of birth, ID band Patient awake    Reviewed: Allergy & Precautions, NPO status , Patient's Chart, lab work & pertinent test results  Airway Mallampati: III  TM Distance: >3 FB Neck ROM: full    Dental  (+) Teeth Intact   Pulmonary asthma , former smoker   Pulmonary exam normal        Cardiovascular hypertension, On Medications and On Home Beta Blockers Normal cardiovascular exam+ dysrhythmias (Sinus tachycardia)      Neuro/Psych negative neurological ROS  negative psych ROS   GI/Hepatic negative GI ROS, Neg liver ROS,,,  Endo/Other  diabetesHypothyroidism  Morbid obesity  Renal/GU negative Renal ROS  negative genitourinary   Musculoskeletal   Abdominal   Peds  Hematology negative hematology ROS (+)   Anesthesia Other Findings Past Medical History: No date: Allergy No date: Asthma No date: Diabetes mellitus without complication (HCC) No date: Essential hypertension No date: Obesity No date: Psoriasis No date: Sinus tachycardia  Past Surgical History: No date: CESAREAN SECTION     Comment:  X 2 No date: HAND SURGERY; Right     Comment:  THUMB No date: HERNIA REPAIR No date: right thumb surgery  BMI    Body Mass Index: 57.98 kg/m      Reproductive/Obstetrics negative OB ROS                             Anesthesia Physical Anesthesia Plan  ASA: 3  Anesthesia Plan: General   Post-op Pain Management: Minimal or no pain anticipated   Induction: Intravenous  PONV Risk Score and Plan: Propofol infusion and TIVA  Airway Management Planned: Natural Airway and Nasal Cannula  Additional Equipment:   Intra-op Plan:   Post-operative Plan:   Informed Consent: I have reviewed the patients History and Physical, chart, labs and discussed the procedure including the risks, benefits and alternatives for the proposed anesthesia  with the patient or authorized representative who has indicated his/her understanding and acceptance.     Dental Advisory Given  Plan Discussed with: Anesthesiologist, CRNA and Surgeon  Anesthesia Plan Comments: (Patient consented for risks of anesthesia including but not limited to:  - adverse reactions to medications - risk of airway placement if required - damage to eyes, teeth, lips or other oral mucosa - nerve damage due to positioning  - sore throat or hoarseness - Damage to heart, brain, nerves, lungs, other parts of body or loss of life  Patient voiced understanding.)        Anesthesia Quick Evaluation

## 2022-11-18 ENCOUNTER — Encounter: Payer: Self-pay | Admitting: Gastroenterology

## 2022-11-18 LAB — SURGICAL PATHOLOGY

## 2022-11-19 ENCOUNTER — Encounter: Payer: Self-pay | Admitting: Gastroenterology

## 2023-03-06 ENCOUNTER — Other Ambulatory Visit: Payer: Self-pay | Admitting: Nurse Practitioner

## 2023-03-08 NOTE — Telephone Encounter (Signed)
Requested medication (s) are due for refill today: yes  Requested medication (s) are on the active medication list: yes  Last refill:  01/14/22  Future visit scheduled: yes  Notes to clinic:  Unable to refill per protocol, units over 50000 routing for approval.      Requested Prescriptions  Pending Prescriptions Disp Refills   Vitamin D, Ergocalciferol, (DRISDOL) 1.25 MG (50000 UNIT) CAPS capsule [Pharmacy Med Name: Vitamin D (Ergocalciferol) 1.25 MG (50000 UT) Oral Capsule] 12 capsule 0    Sig: Take 1 capsule by mouth once a week     Endocrinology:  Vitamins - Vitamin D Supplementation 2 Failed - 03/06/2023  9:56 PM      Failed - Manual Review: Route requests for 50,000 IU strength to the provider      Passed - Ca in normal range and within 360 days    Calcium  Date Value Ref Range Status  10/04/2022 9.5 8.7 - 10.2 mg/dL Final         Passed - Vitamin D in normal range and within 360 days    Vit D, 25-Hydroxy  Date Value Ref Range Status  10/04/2022 51.5 30.0 - 100.0 ng/mL Final    Comment:    Vitamin D deficiency has been defined by the Institute of Medicine and an Endocrine Society practice guideline as a level of serum 25-OH vitamin D less than 20 ng/mL (1,2). The Endocrine Society went on to further define vitamin D insufficiency as a level between 21 and 29 ng/mL (2). 1. IOM (Institute of Medicine). 2010. Dietary reference    intakes for calcium and D. [place]: The    [employer]. 2. Holick MF, Binkley [place], Bischoff-Ferrari HA, et al.    Evaluation, treatment, and prevention of vitamin D    deficiency: an Endocrine Society clinical practice    guideline. JCEM. 2011 Jul; 96(7):1911-30.          Passed - Valid encounter within last 12 months    Recent Outpatient Visits           5 months ago Type 2 diabetes mellitus with morbid obesity ([place])   [place], [place] T, NP   6 months ago Heart burn   [place], Henrine Screws T, NP   7 months ago LUQ abdominal pain   [place], [place] T, NP   9 months ago Type 2 diabetes mellitus with morbid obesity ([place])   [place], Henrine Screws T, NP   1 year ago Type 2 diabetes mellitus with morbid obesity ([place])   [place], Barbaraann Faster, NP       Future Appointments             In 4 weeks Cannady, Barbaraann Faster, NP [place], [facility]

## 2023-03-21 ENCOUNTER — Ambulatory Visit
Admission: RE | Admit: 2023-03-21 | Discharge: 2023-03-21 | Disposition: A | Discharge status: Home / Self Care | Payer: Managed Care, Other (non HMO) | Source: Ambulatory Visit | Attending: Nurse Practitioner | Admitting: Nurse Practitioner

## 2023-03-21 DIAGNOSIS — Z1231 Encounter for screening mammogram for malignant neoplasm of breast: Secondary | ICD-10-CM | POA: Insufficient documentation

## 2023-03-22 NOTE — Progress Notes (Signed)
Contacted via MyChart   Normal mammogram, may repeat in one year:)

## 2023-04-03 NOTE — Patient Instructions (Incomplete)
Ozempic and Trulicity for diabetes and weight loss  Be Involved in Your Health Care:  Taking Medications When medications are taken as directed, they can greatly improve your health. But if they are not taken as instructed, they may not work. In some cases, not taking them correctly can be harmful. To help ensure your treatment remains effective and safe, understand your medications and how to take them.  Your lab results, notes and after visit summary will be available on My Chart. We strongly encourage you to use this feature. If lab results are abnormal the clinic will contact you with the appropriate steps. If the clinic does not contact you assume the results are satisfactory. You can always see your results on My Chart. If you have questions regarding your condition, please contact the clinic during office hours. You can also ask questions on My Chart.  We at [facility] are grateful that you chose [place] to provide care. We strive to provide excellent and compassionate care and are always looking for feedback. If you get a survey from the clinic please complete this.    Diabetes Mellitus Basics  Diabetes mellitus, or diabetes, is a long-term (chronic) disease. It occurs when the body does not properly use sugar (glucose) that is released from food after you eat. Diabetes mellitus may be caused by one or both of these problems: Your pancreas does not make enough of a hormone called insulin. Your body does not react in a normal way to the insulin that it makes. Insulin lets glucose enter cells in your body. This gives you energy. If you have diabetes, glucose cannot get into cells. This causes high blood glucose (hyperglycemia). How to treat and manage diabetes You may need to take insulin or other diabetes medicines daily to keep your glucose in balance. If you are prescribed insulin, you will learn how to give yourself insulin by injection. You may need to adjust the amount of  insulin you take based on the foods that you eat. You will need to check your blood glucose levels using a glucose monitor as told by your health care provider. The readings can help determine if you have low or high blood glucose. Generally, you should have these blood glucose levels: Before meals (preprandial): 80-130 mg/dL (9.6-0.4 mmol/L). After meals (postprandial): below 180 mg/dL (10 mmol/L). Hemoglobin A1c (HbA1c) level: less than 7%. Your health care provider will set treatment goals for you. Keep all follow-up visits. This is important. Follow these instructions at home: Diabetes medicines Take your diabetes medicines every day as told by your health care provider. List your diabetes medicines here: Name of medicine: ______________________________ Amount (dose): _______________ Time (a.m./p.m.): _______________ Notes: ___________________________________ Name of medicine: ______________________________ Amount (dose): _______________ Time (a.m./p.m.): _______________ Notes: ___________________________________ Name of medicine: ______________________________ Amount (dose): _______________ Time (a.m./p.m.): _______________ Notes: ___________________________________ Insulin If you use insulin, list the types of insulin you use here: Insulin type: ______________________________ Amount (dose): _______________ Time (a.m./p.m.): _______________Notes: ___________________________________ Insulin type: ______________________________ Amount (dose): _______________ Time (a.m./p.m.): _______________ Notes: ___________________________________ Insulin type: ______________________________ Amount (dose): _______________ Time (a.m./p.m.): _______________ Notes: ___________________________________ Insulin type: ______________________________ Amount (dose): _______________ Time (a.m./p.m.): _______________ Notes: ___________________________________ Insulin type: ______________________________ Amount  (dose): _______________ Time (a.m./p.m.): _______________ Notes: ___________________________________ Managing blood glucose  Check your blood glucose levels using a glucose monitor as told by your health care provider. Write down the times that you check your glucose levels here: Time: _______________ Notes: ___________________________________ Time: _______________ Notes: ___________________________________ Time: _______________ Notes: ___________________________________ Time: _______________  Notes: ___________________________________ Time: _______________ Notes: ___________________________________ Time: _______________ Notes: ___________________________________  Low blood glucose Low blood glucose (hypoglycemia) is when glucose is at or below 70 mg/dL (3.9 mmol/L). Symptoms may include: Feeling: Hungry. Sweaty and clammy. Irritable or easily upset. Dizzy. Sleepy. Having: A fast heartbeat. A headache. A change in your vision. Numbness around the mouth, lips, or tongue. Having trouble with: Moving (coordination). Sleeping. Treating low blood glucose To treat low blood glucose, eat or drink something containing sugar right away. If you can think clearly and swallow safely, follow the 15:15 rule: Take 15 grams of a fast-acting carb (carbohydrate), as told by your health care provider. Some fast-acting carbs are: Glucose tablets: take 3-4 tablets. Hard candy: eat 3-5 pieces. Fruit juice: drink 4 oz (120 mL). Regular (not diet) soda: drink 4-6 oz (120-180 mL). Honey or sugar: eat 1 Tbsp (15 mL). Check your blood glucose levels 15 minutes after you take the carb. If your glucose is still at or below 70 mg/dL (3.9 mmol/L), take 15 grams of a carb again. If your glucose does not go above 70 mg/dL (3.9 mmol/L) after 3 tries, get help right away. After your glucose goes back to normal, eat a meal or a snack within 1 hour. Treating very low blood glucose If your glucose is at or below  54 mg/dL (3 mmol/L), you have very low blood glucose (severe hypoglycemia). This is an emergency. Do not wait to see if the symptoms will go away. Get medical help right away. Call your local emergency services (911 in the [place] Do not drive yourself to the hospital. Questions to ask your health care provider Should I talk with a diabetes educator? What equipment will I need to care for myself at home? What diabetes medicines do I need? When should I take them? How often do I need to check my blood glucose levels? What number can I call if I have questions? When is my follow-up visit? Where can I find a support group for people with diabetes? Where to find more information American Diabetes Association: www.diabetes.org Association of Diabetes Care and Education Specialists: www.diabeteseducator.org Contact a health care provider if: Your blood glucose is at or above 240 mg/dL (16.1 mmol/L) for 2 days in a row. You have been sick or have had a fever for 2 days or more, and you are not getting better. You have any of these problems for more than 6 hours: You cannot eat or drink. You feel nauseous. You vomit. You have diarrhea. Get help right away if: Your blood glucose is lower than 54 mg/dL (3 mmol/L). You get confused. You have trouble thinking clearly. You have trouble breathing. These symptoms may represent a serious problem that is an emergency. Do not wait to see if the symptoms will go away. Get medical help right away. Call your local emergency services (911 in the [place] Do not drive yourself to the hospital. Summary Diabetes mellitus is a chronic disease that occurs when the body does not properly use sugar (glucose) that is released from food after you eat. Take insulin and diabetes medicines as told. Check your blood glucose every day, as often as told. Keep all follow-up visits. This is important. This information is not intended to replace advice given to you by your  health care provider. Make sure you discuss any questions you have with your health care provider. Document Revised: 04/01/2020 Document Reviewed: 04/01/2020 Elsevier Patient Education  2023 [employer].

## 2023-04-06 ENCOUNTER — Ambulatory Visit: Payer: Managed Care, Other (non HMO) | Admitting: Nurse Practitioner

## 2023-04-06 ENCOUNTER — Encounter: Payer: Self-pay | Admitting: Nurse Practitioner

## 2023-04-06 DIAGNOSIS — E1159 Type 2 diabetes mellitus with other circulatory complications: Secondary | ICD-10-CM

## 2023-04-06 DIAGNOSIS — E559 Vitamin D deficiency, unspecified: Secondary | ICD-10-CM

## 2023-04-06 DIAGNOSIS — E1169 Type 2 diabetes mellitus with other specified complication: Secondary | ICD-10-CM | POA: Diagnosis not present

## 2023-04-06 DIAGNOSIS — E538 Deficiency of other specified B group vitamins: Secondary | ICD-10-CM

## 2023-04-06 DIAGNOSIS — M2550 Pain in unspecified joint: Secondary | ICD-10-CM | POA: Insufficient documentation

## 2023-04-06 DIAGNOSIS — I479 Paroxysmal tachycardia, unspecified: Secondary | ICD-10-CM | POA: Diagnosis not present

## 2023-04-06 DIAGNOSIS — E785 Hyperlipidemia, unspecified: Secondary | ICD-10-CM

## 2023-04-06 DIAGNOSIS — I152 Hypertension secondary to endocrine disorders: Secondary | ICD-10-CM

## 2023-04-06 DIAGNOSIS — E039 Hypothyroidism, unspecified: Secondary | ICD-10-CM

## 2023-04-06 LAB — MICROALBUMIN, URINE WAIVED
Creatinine, Urine Waived: 200 mg/dL (ref 10–300)
Microalb, Ur Waived: 30 mg/L — ABNORMAL HIGH (ref 0–19)
Microalb/Creat Ratio: <30 mg/g (ref <30)

## 2023-04-06 LAB — BAYER DCA HB A1C WAIVED: HB A1C (BAYER DCA - WAIVED): 6.3 % — ABNORMAL HIGH (ref 4.8–5.6)

## 2023-04-06 NOTE — Assessment & Plan Note (Signed)
Stable, continue Bystolic as ordered by Dr. Gollan, cardiology, is tolerating well.  Will not increase at this time, although per cardiology note can consider increase to 5 MG BID or 10 MG nightly if needed in future.  Overall her HR is remaining stable at this time with no symptoms. 

## 2023-04-06 NOTE — Assessment & Plan Note (Signed)
For months, recommend reduce Rosuvastatin to 3 days a week and see if improvement with this.  Check uric acid level today.  If ongoing then consider obtaining ANA.

## 2023-04-06 NOTE — Progress Notes (Addendum)
BP 109/72   Pulse 73   Temp 98.1 F (36.7 C) (Oral)   Ht 5' 2.01" (1.575 m)   Wt (!) 335 lb 12.8 oz (152.3 kg)   SpO2 97%   BMI 61.40 kg/m    Subjective:    Patient ID: Amy [name], female    DOB: 24-Nov-1976, 47 y.o.   MRN: [id number]  HPI: Amy [name] is a 47 y.o. female  Chief Complaint  Patient presents with   Diabetes   Hypertension   Hyperlipidemia   Vitamin D   DIABETES Last A1c 5.9% in October.  Taking Metformin 500 MG at night.   Hypoglycemic episodes:no Polydipsia/polyuria: no Visual disturbance: no Chest pain: no Paresthesias: no Glucose Monitoring: yes             Accucheck frequency: Daily             Fasting glucose: 98 to 120 range             Post prandial:             Evening:             Before meals: Taking Insulin?: no             Long acting insulin:             Short acting insulin: Blood Pressure Monitoring: daily Retinal Examination: Up to Date -- Dr. Alvester Morin goes every 6 months Foot Exam: Up to Date Pneumovax: Up to Date Influenza: Up to Date Aspirin: no    HYPERTENSION / HYPERLIPIDEMIA Taking Losartan 25 MG daily, Bystolic 5 MG daily, and Crestor 10 MG daily. Follows with Dr. Mariah Milling with cardiology due to tachycardia -- last saw 09/10/22 with no changes. Satisfied with current treatment? yes Duration of hypertension: chronic BP monitoring frequency: daily BP range: 95/50 to 110/78 BP medication side effects: no Past BP meds: as above Duration of hyperlipidemia: chronic Cholesterol medication side effects: no Cholesterol supplements: none Past cholesterol medications: none Medication compliance: good compliance Aspirin: no Recent stressors: no Recurrent headaches: no Visual changes: no Palpitations: no Dyspnea: no Chest pain: no Lower extremity edema: no Dizzy/lightheaded: no  The ASCVD Risk score (Arnett DK, et al., 2019) failed to calculate for the following reasons:   The valid total cholesterol range is 130  to 320 mg/dL  HYPOTHYROIDISM Continues on Levothyroxine 50 MCG.  Taking Vitamin D weekly with history of low levels.  Taking B12 supplement daily.    Having all over joint for several months -- feels this from head to toe.  Some days hurts so bad you do not want to walk.  Hit lower right shin against trailer hitch on Valentine's and has been healing slowly.  Her mother has gout.   Thyroid control status:stable Satisfied with current treatment? yes Medication side effects: no Medication compliance: good compliance Etiology of hypothyroidism:  Recent dose adjustment:no Fatigue: no Cold intolerance: no Heat intolerance: no Weight gain: no Weight loss: no Constipation: no Diarrhea/loose stools: no Palpitations: no Lower extremity edema: no Anxiety/depressed mood: no   Relevant past medical, surgical, family and social history reviewed and updated as indicated. Interim medical history since our last visit reviewed. Allergies and medications reviewed and updated.  Review of Systems  Constitutional:  Negative for activity change, appetite change, diaphoresis, fatigue and fever.  Respiratory:  Negative for cough, chest tightness and shortness of breath.   Cardiovascular:  Negative for chest pain, palpitations and leg swelling.  Gastrointestinal: Negative.  Endocrine: Negative for cold intolerance, heat intolerance, polydipsia, polyphagia and polyuria.  Neurological:  Negative for dizziness, syncope, weakness, light-headedness, numbness and headaches.  Psychiatric/Behavioral: Negative.      Per HPI unless specifically indicated above     Objective:    BP 109/72   Pulse 73   Temp 98.1 F (36.7 C) (Oral)   Ht 5' 2.01" (1.575 m)   Wt (!) 335 lb 12.8 oz (152.3 kg)   SpO2 97%   BMI 61.40 kg/m   Wt Readings from Last 3 Encounters:  04/06/23 (!) 335 lb 12.8 oz (152.3 kg)  11/17/22 (!) 317 lb (143.8 kg)  10/04/22 (!) 322 lb (146.1 kg)    Physical Exam Vitals and nursing note  reviewed.  Constitutional:      General: She is awake. She is not in acute distress.    Appearance: She is well-developed and well-groomed. She is morbidly obese. She is not ill-appearing.  HENT:     Head: Normocephalic.     Right Ear: Hearing normal.     Left Ear: Hearing normal.  Eyes:     General: Lids are normal.        Right eye: No discharge.        Left eye: No discharge.     Conjunctiva/sclera: Conjunctivae normal.     Pupils: Pupils are equal, round, and reactive to light.  Neck:     Thyroid: No thyromegaly.     Vascular: No carotid bruit.  Cardiovascular:     Rate and Rhythm: Normal rate and regular rhythm.     Heart sounds: Normal heart sounds. No murmur heard.    No gallop.  Pulmonary:     Effort: Pulmonary effort is normal. No accessory muscle usage or respiratory distress.     Breath sounds: Normal breath sounds.  Abdominal:     General: Bowel sounds are normal.     Palpations: Abdomen is soft.  Musculoskeletal:     Cervical back: Normal range of motion and neck supple.     Right lower leg: No edema.     Left lower leg: No edema.  Skin:    General: Skin is warm and dry.  Neurological:     Mental Status: She is alert and oriented to person, place, and time.  Psychiatric:        Attention and Perception: Attention normal.        Mood and Affect: Mood normal.        Speech: Speech normal.        Behavior: Behavior normal. Behavior is cooperative.        Thought Content: Thought content normal.    Diabetic Foot Exam - Simple   Simple Foot Form Visual Inspection No deformities, no ulcerations, no other skin breakdown bilaterally: Yes Sensation Testing Intact to touch and monofilament testing bilaterally: Yes Pulse Check Posterior Tibialis and Dorsalis pulse intact bilaterally: Yes Comments    Results for orders placed or performed in visit on 04/06/23  Bayer DCA Hb A1c Waived  Result Value Ref Range   HB A1C (BAYER DCA - WAIVED) 6.3 (H) 4.8 - 5.6 %   Microalbumin, Urine Waived  Result Value Ref Range   Microalb, Ur Waived 30 (H) 0 - 19 mg/L   Creatinine, Urine Waived 200 10 - 300 mg/dL   Microalb/Creat Ratio <30 <30 mg/g      Assessment & Plan:   Problem List Items Addressed This Visit       Cardiovascular and  Mediastinum   Hypertension associated with diabetes    Chronic, stable.  BP well below goal in office today and on home readings.  Continue Losartan 50 MG daily and Bystolic 5 MG to take at night. Tolerating medications well, may need to reduce in future if levels get too low.  Will continue collaboration with cardiology. Recommend she monitor BP at least a few mornings a week at home and document.  DASH diet at home. Labs: CMP and urine ALB.  Urine ALB 12 April 2023.       Relevant Orders   Bayer DCA Hb A1c Waived (Completed)   Microalbumin, Urine Waived (Completed)   Comprehensive metabolic panel   Paroxysmal tachycardia    Stable, continue Bystolic as ordered by Dr. Mariah Milling, cardiology, is tolerating well.  Will not increase at this time, although per cardiology note can consider increase to 5 MG BID or 10 MG nightly if needed in future.  Overall her HR is remaining stable at this time with no symptoms.        Endocrine   Hyperlipidemia associated with type 2 diabetes mellitus    Chronic, stable.  Continue Rosuvastatin, but reduce to 3 days a week dosing to see if reduction in joint pain with this.  Lipid panel obtained today.      Relevant Orders   Bayer DCA Hb A1c Waived (Completed)   Comprehensive metabolic panel   Lipid Panel w/o Chol/HDL Ratio   Hypothyroid    Chronic, stable.  Continue current medication regimen and adjust as needed. Thyroid labs up to date.      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  Diagnosed in October 2021 -- A1c 6.3% at this time and urine ALB 12 April 2023.  Continue current medication regimen and adjust as needed + continue diet focus.  Continue  to monitor BS daily.  Consider trial Ozempic, discussed with her as she is interested in weight loss and this would offer benefit to that.  She will review and let provider know if she wishes to trial.  Would stop Metformin if Ozempic brought on board.      Relevant Orders   Bayer DCA Hb A1c Waived (Completed)   Microalbumin, Urine Waived (Completed)   Comprehensive metabolic panel     Other   B12 deficiency    Noted on past labs, is taking supplement, continue this and check level today.      Morbid obesity    BMI 61.40 with T2DM, HTN/HLD.  Recommended eating smaller high protein, low fat meals more frequently and exercising 30 mins a day 5 times a week with a goal of 10-15lb weight loss in the next 3 months. Patient voiced their understanding and motivation to adhere to these recommendations. Referral to weight management placed per request.       Relevant Orders   Amb Ref to Medical Weight Management   Multiple joint pain    For months, recommend reduce Rosuvastatin to 3 days a week and see if improvement with this.  Check uric acid level today.  If ongoing then consider obtaining ANA.      Relevant Orders   Uric acid   Vitamin D deficiency    Ongoing, continue weekly supplement and recheck level today.  Could consider changing to daily Vitamin D3 if level improving.        Follow up plan: Return in about 6 months (around 10/06/2023) for Annual physical after 10/05/23.

## 2023-04-06 NOTE — Assessment & Plan Note (Signed)
Chronic, stable.  Continue current medication regimen and adjust as needed.  Thyroid labs up to date. 

## 2023-04-06 NOTE — Assessment & Plan Note (Signed)
Ongoing, continue weekly supplement and recheck level today.  Could consider changing to daily Vitamin D3 if level improving. 

## 2023-04-06 NOTE — Assessment & Plan Note (Signed)
Noted on past labs, is taking supplement, continue this and check level today. 

## 2023-04-06 NOTE — Assessment & Plan Note (Signed)
BMI 61.40 with T2DM, HTN/HLD.  Recommended eating smaller high protein, low fat meals more frequently and exercising 30 mins a day 5 times a week with a goal of 10-15lb weight loss in the next 3 months. Patient voiced their understanding and motivation to adhere to these recommendations. Referral to weight management placed per request.

## 2023-04-06 NOTE — Assessment & Plan Note (Addendum)
Chronic, ongoing.  Diagnosed in October 2021 -- A1c 6.3% at this time and urine ALB 12 April 2023.  Continue current medication regimen and adjust as needed + continue diet focus.  Continue to monitor BS daily.  Consider trial Ozempic, discussed with her as she is interested in weight loss and this would offer benefit to that.  She will review and let provider know if she wishes to trial.  Would stop Metformin if Ozempic brought on board.

## 2023-04-06 NOTE — Assessment & Plan Note (Addendum)
Chronic, stable.  Continue Rosuvastatin, but reduce to 3 days a week dosing to see if reduction in joint pain with this.  Lipid panel obtained today.

## 2023-04-06 NOTE — Assessment & Plan Note (Signed)
Chronic, stable.  BP well below goal in office today and on home readings.  Continue Losartan 50 MG daily and Bystolic 5 MG to take at night. Tolerating medications well, may need to reduce in future if levels get too low.  Will continue collaboration with cardiology. Recommend she monitor BP at least a few mornings a week at home and document.  DASH diet at home. Labs: CMP and urine ALB.  Urine ALB 12 April 2023.

## 2023-04-07 LAB — LIPID PANEL W/O CHOL/HDL RATIO
Cholesterol, Total: 115 mg/dL (ref 100–199)
HDL: 45 mg/dL (ref >39)
LDL Chol Calc (NIH): 52 mg/dL (ref 0–99)
Triglycerides: 91 mg/dL (ref 0–149)
VLDL Cholesterol Cal: 18 mg/dL (ref 5–40)

## 2023-04-07 LAB — COMPREHENSIVE METABOLIC PANEL
ALT: 6 IU/L (ref 0–32)
AST: 15 IU/L (ref 0–40)
Albumin/Globulin Ratio: 1.2 (ref 1.2–2.2)
Albumin: 3.7 g/dL — ABNORMAL LOW (ref 3.9–4.9)
Alkaline Phosphatase: 90 IU/L (ref 44–121)
BUN/Creatinine Ratio: 17 (ref 9–23)
BUN: 10 mg/dL (ref 6–24)
Bilirubin Total: 0.8 mg/dL (ref 0.0–1.2)
CO2: 24 mmol/L (ref 20–29)
Calcium: 8.9 mg/dL (ref 8.7–10.2)
Chloride: 103 mmol/L (ref 96–106)
Creatinine, Ser: 0.59 mg/dL (ref 0.57–1.00)
Globulin, Total: 3 g/dL (ref 1.5–4.5)
Glucose: 108 mg/dL — ABNORMAL HIGH (ref 70–99)
Potassium: 4.1 mmol/L (ref 3.5–5.2)
Sodium: 141 mmol/L (ref 134–144)
Total Protein: 6.7 g/dL (ref 6.0–8.5)
eGFR: 112 mL/min/{1.73_m2} (ref >59)

## 2023-04-07 NOTE — Progress Notes (Signed)
Contacted via MyChart   Good evening Amy [name], your labs have returned: - Kidney function, creatinine and eGFR, remains normal, as is liver function, AST and ALT.  - Cholesterol levels are at goal.  Continue all medications.  Any questions? Keep being amazing!!  Thank you for allowing me to participate in your care.  I appreciate you. Kindest regards, Haidyn Kilburg

## 2023-05-03 ENCOUNTER — Ambulatory Visit: Payer: Managed Care, Other (non HMO) | Admitting: Physician Assistant

## 2023-05-03 ENCOUNTER — Encounter: Payer: Self-pay | Admitting: Physician Assistant

## 2023-05-03 VITALS — BP 132/80 | HR 84 | Temp 97.8°F | Resp 18 | Ht 62.0 in | Wt 335.0 lb

## 2023-05-03 DIAGNOSIS — R110 Nausea: Secondary | ICD-10-CM | POA: Diagnosis not present

## 2023-05-03 DIAGNOSIS — R519 Headache, unspecified: Secondary | ICD-10-CM

## 2023-05-03 MED ORDER — ONDANSETRON 4 MG PO TBDP: 4.0000 mg | ORAL_TABLET | Freq: Three times a day (TID) | ORAL | 0 refills | Status: DC | PRN

## 2023-05-03 NOTE — Patient Instructions (Addendum)
I recommend the following to help with your headache and head pressure:  You can take Tylenol for the pain You can use warm compresses around your neck and shoulders along with Voltaren gel to help with muscle aches and pain  I have sent in a medication called Zofran to help with nausea.  You can take this as needed up to every 8 hours   If your symptoms get worse, increased pain, slurring words, facial drooping, confusion, increased nausea, passing out, weakness on one side of the body please go to the ED for head imaging.

## 2023-05-03 NOTE — Progress Notes (Signed)
Acute Office Visit   Patient: Amy [name]   DOB: [birth year]   47 y.o. Female  MRN: [id number] Visit Date: 05/03/2023  Today's healthcare provider: Oswaldo Conroy Gaven Eugene, PA-C  Introduced myself to the patient as a Secondary school teacher and provided education on APPs in clinical practice.    Chief Complaint  Patient presents with   Nausea    With head pressure on right side and a little dizziness.  Onset 2 days   Subjective    HPI HPI     Nausea    Additional comments: With head pressure on right side and a little dizziness.  Onset 2 days      Last edited by Marcos Eke, CMA on 05/03/2023  1:12 PM.        She reports feeling a lot of pressure on the right side of her head  She states this started yesterday at work and she noticed her vision was fuzzy when it started She also reports persistent nausea but no vomiting   She denies falls or head trauma prior to this starting  She has checked her blood glucose and it has been "fine" (100-110)  She reports the pressure feels better when she closes her eyes   She reports the pain hurts along her right temple and into the right side of her face along her jaw  The pressure sensation radiates in the back of her head and the side of her neck also feels tight  She states the worst pain is at the back of her head on the right side She reports some pain with lateral flexion  She states putting her hands behind her head and pulling forward, keeping head pulled forward provides some relief She states running a cool water bottle over the area also helps it feel better.  She reports the pain/pressure seems to get worse with being mobile and active.  She denies pain with eating, sinus pressure or tooth pain  She denies previous hx of clotting disorder or cancer  Medications: Outpatient Medications Prior to Visit  Medication Sig   Blood Glucose Monitoring Suppl (ONETOUCH VERIO) w/Device KIT Use to check blood sugar once a day, first thing in  morning fasting, with goal blood sugar < 130.   glucose blood (ONETOUCH VERIO) test strip Use to check blood sugar once daily, fasting.   Lancets (ONETOUCH ULTRASOFT) lancets Use to check blood sugar once daily, fasting.   levocetirizine (XYZAL) 5 MG tablet Take 1 tablet by mouth once daily   levothyroxine (SYNTHROID) 50 MCG tablet Take 1 tablet (50 mcg total) by mouth daily.   losartan (COZAAR) 25 MG tablet Take 1 tablet (25 mg total) by mouth daily.   metFORMIN (GLUCOPHAGE) 500 MG tablet Take 1 tablet (500 mg total) by mouth at bedtime.   Multiple Vitamin (MULTIVITAMINS PO) Take 1 tablet by mouth daily.    nebivolol (BYSTOLIC) 5 MG tablet Take 1 tablet (5 mg total) by mouth daily. May take an extra 5 mg as needed for tachycardia   rosuvastatin (CRESTOR) 10 MG tablet Take 1 tablet (10 mg total) by mouth daily.   Vitamin D, Ergocalciferol, (DRISDOL) 1.25 MG (50000 UNIT) CAPS capsule Take 1 capsule by mouth once a week   No facility-administered medications prior to visit.    Review of Systems  Constitutional:  Positive for fatigue.  HENT:  Negative for congestion, dental problem, ear pain, rhinorrhea, sinus pressure, sinus pain, tinnitus and trouble swallowing.  Eyes:  Positive for photophobia and visual disturbance.  Gastrointestinal:  Positive for nausea. Negative for vomiting.  Neurological:  Positive for dizziness and headaches (right sided into jaw and along occipital area). Negative for facial asymmetry, speech difficulty, weakness and numbness.       Objective    BP 132/80   Pulse 84   Temp 97.8 F (36.6 C)   Resp 18   Ht 5\' 2"  (1.575 m)   Wt (!) 335 lb (152 kg)   SpO2 99%   BMI 61.27 kg/m    Physical Exam Vitals reviewed.  Constitutional:      General: She is awake.     Appearance: Normal appearance. She is well-developed.  HENT:     Head: Normocephalic and atraumatic.     Mouth/Throat:     Lips: Pink.     Mouth: Mucous membranes are moist.     Tongue: No  lesions. Tongue does not deviate from midline.     Pharynx: Oropharynx is clear. Uvula midline. No oropharyngeal exudate or posterior oropharyngeal erythema.  Eyes:     General: Lids are normal. Gaze aligned appropriately.     Extraocular Movements: Extraocular movements intact.     Conjunctiva/sclera: Conjunctivae normal.     Pupils: Pupils are equal, round, and reactive to light.  Pulmonary:     Effort: Pulmonary effort is normal.  Musculoskeletal:     Cervical back: Normal range of motion and neck supple. No rigidity. No pain with movement. Normal range of motion.     Right lower leg: No edema.     Left lower leg: No edema.  Lymphadenopathy:     Head:     Right side of head: No submental, submandibular or preauricular adenopathy.     Left side of head: No submental, submandibular or preauricular adenopathy.     Cervical: No cervical adenopathy.     Right cervical: No superficial or posterior cervical adenopathy.    Left cervical: No superficial or posterior cervical adenopathy.     Upper Body:     Right upper body: No supraclavicular adenopathy.     Left upper body: No supraclavicular adenopathy.  Neurological:     General: No focal deficit present.     Mental Status: She is alert and oriented to person, place, and time. Mental status is at baseline.     GCS: GCS eye subscore is 4. GCS verbal subscore is 5. GCS motor subscore is 6.     Cranial Nerves: Cranial nerves 2-12 are intact. No dysarthria or facial asymmetry.     Motor: Motor function is intact. No weakness, tremor or abnormal muscle tone.     Coordination: Coordination normal. Finger-Nose-Finger Test normal.     Gait: Gait is intact.     Comments: Comments: MENTAL STATUS: AAOx3, memory intact, fund of knowledge appropriate  LANG/SPEECH: Naming and repetition intact, fluent, no dysarthria, answers questions appropriately CRANIAL NERVES:   II: Pupils equal and reactive, no RAPD   III, IV, VI: EOM intact, no gaze  preference or deviation, no nystagmus.   VII: no asymmetry, no nasolabial fold flattening   VIII: normal hearing to speech   IX, X: normal palatal elevation, no uvular deviation   XI: 5/5 head turn and 5/5 shoulder shrug bilaterally   XII: midline tongue protrusion  MOTOR:  5/5 bilateral grip strength SENSORY:  Normal to light touch COORD: Normal finger to nose, no tremor, no dysmetria GAIT: Normal   Psychiatric:  Attention and Perception: Attention and perception normal.        Mood and Affect: Mood and affect normal.        Speech: Speech normal.        Behavior: Behavior is cooperative.        Thought Content: Thought content normal.        Cognition and Memory: Cognition normal.       No results found for any visits on 05/03/23.  Assessment & Plan      No follow-ups on file.      Problem List Items Addressed This Visit   None Visit Diagnoses     Acute nonintractable headache, unspecified headache type    -  Primary Acute, new concern Patient reports right-sided head pressure that extends from right temple and radiates into the back of her head on the right side.  She also states that her neck feels tight Physical exam and neurological exam are overall reassuring at this time. Reviewed patient's BP log from home- previous BP results over the last 2 days appear to be in her normal range.  Symptoms appear consistent with potential tension type headache or cluster headache but I cannot definitively rule out the following: TMJ disruption, sinus pressure, CVA, migraine At this time I recommend treating symptomatically with Tylenol, Taryn gel, warm compresses, gentle stretches as tolerated. Will provide Zofran for nausea.  I provided patient with strict return and emergency room precautions in the room and repeated in her AVS Follow-up as needed for persistent or progressing symptoms    Nausea       Relevant Medications   ondansetron (ZOFRAN-ODT) 4 MG  disintegrating tablet        No follow-ups on file.   I, Judeth Gilles E Carlous Olivares, PA-C, have reviewed all documentation for this visit. The documentation on 05/03/23 for the exam, diagnosis, procedures, and orders are all accurate and complete.   Jacquelin Hawking, MHS, PA-C [facility]

## 2023-06-18 NOTE — Patient Instructions (Signed)
Shoulder Pain Many things can cause shoulder pain, including: An injury. Moving the shoulder in the same way again and again (overuse). Joint pain (arthritis). Pain can come from: Swelling and irritation (inflammation) of any part of the shoulder. An injury to: The shoulder joint. Tissues that connect muscle to bone (tendons). Tissues that connect bones to each other (ligaments). Bones. Follow these instructions at home: Watch for changes in your symptoms. Let your doctor know about them. Follow these instructions to help with your pain. If you have a sling that can be taken off: Wear the sling as told by your doctor. Take it off only as told by your doctor. Check the skin around the sling every day. Tell your doctor if you see problems. Loosen the sling if your fingers: Tingle. Become numb. Become cold. Keep the sling clean. If the sling is not waterproof: Do not let it get wet. Take the sling off when you shower or bathe. Managing pain, stiffness, and swelling  If told, put ice on the painful area. Put ice in a plastic bag. Place a towel between your skin and the bag. Leave the ice on for 20 minutes, 2-3 times a day. Stop putting ice on if it does not help with the pain. If your skin turns bright red, take off the ice right away to prevent skin damage. The risk of damage is higher if you cannot feel pain, heat, or cold. Squeeze a soft ball or a foam pad as much as possible. This prevents swelling in the shoulder. It also helps to strengthen the arm. General instructions Take over-the-counter and prescription medicines only as told by your doctor. Keep all follow-up visits. This will help you avoid any type of permanent shoulder problems. Contact a doctor if: Your pain gets worse. Medicine does not help your pain. You have new pain in your arm, hand, or fingers. You loosen your sling and your arm, hand, or fingers: Tingle. Are numb. Are swollen. Get help right away  if: Your arm, hand, or fingers turn white or blue. This information is not intended to replace advice given to you by your health care provider. Make sure you discuss any questions you have with your health care provider. Document Revised: 07/02/2022 Document Reviewed: 07/02/2022 Elsevier Patient Education  2024 [employer]vier Inc[employer]  

## 2023-06-20 ENCOUNTER — Encounter: Payer: Self-pay | Admitting: Nurse Practitioner

## 2023-06-20 ENCOUNTER — Ambulatory Visit: Payer: Managed Care, Other (non HMO) | Admitting: Nurse Practitioner

## 2023-06-20 VITALS — BP 109/71 | HR 71 | Temp 97.9°F | Ht 62.01 in | Wt 333.4 lb

## 2023-06-20 DIAGNOSIS — M542 Cervicalgia: Secondary | ICD-10-CM | POA: Diagnosis not present

## 2023-06-20 MED ORDER — MELOXICAM 7.5 MG PO TABS: 7.5000 mg | ORAL_TABLET | Freq: Every day | ORAL | 0 refills | Status: DC

## 2023-06-20 MED ORDER — ROSUVASTATIN CALCIUM 10 MG PO TABS: 10.0000 mg | ORAL_TABLET | ORAL | 4 refills | Status: DC

## 2023-06-20 MED ORDER — METHOCARBAMOL 500 MG PO TABS: 500.0000 mg | ORAL_TABLET | Freq: Three times a day (TID) | ORAL | 0 refills | Status: DC | PRN

## 2023-06-20 MED ORDER — METHYLPREDNISOLONE SODIUM SUCC 40 MG IJ SOLR
40.0000 mg | Freq: Once | INTRAMUSCULAR | Status: AC
  Administered 2023-06-20: 40 mg via INTRAMUSCULAR

## 2023-06-20 NOTE — Assessment & Plan Note (Signed)
Acute for 4 weeks with no benefit from current regimen.  No red flags.  At this time will trial Meloxicam 7.5 MG daily + change to Robaxin which may make her less fatigued.  Solu Medrol injection in office today for radiculopathy -- tolerates this but not steroid taper.  Recommend she monitor sugars closely with this.  Plan for return in 3 weeks, if ongoing pain then obtain imaging.

## 2023-06-20 NOTE — Progress Notes (Signed)
BP 109/71   Pulse 71   Temp 97.9 F (36.6 C) (Oral)   Ht 5' 2.01" (1.575 m)   Wt (!) 333 lb 5.8 oz (151.2 kg)   SpO2 97%   BMI 60.96 kg/m    Subjective:    Patient ID: Amy [name], female    DOB: [birth year], 47 y.o.   MRN: [id number]  HPI: Amy [name] is a 47 y.o. female  Chief Complaint  Patient presents with   Neck Pain    Started about a month ago, radiates to the left arm   NECK PAIN  Started one month ago with neck pain -- went to provider at her workplace was provided muscle relaxer + Tramadol -- this offers some benefit, eases it but does not make it go away.  Last A1c was 6.3% -- sugar levels have been good at home.  No recent injuries.  Goes to chiropractor, but this is not helping.  Does not do well with steroid taper. Treatments attempted: Flexeril, Tramadol, ice, Tylenol Compliant with recommended treatment: yes Relief with NSAIDs?:  No NSAIDs Taken Location:Left and midline Duration:weeks Severity: 8/10 Quality: dull, aching, pressure-like, and throbbing Frequency: intermittent Radiation: L arm all the way to fingers Aggravating factors: none Alleviating factors: ice Weakness:  no Paresthesias / decreased sensation:  no  Fevers:  no   Relevant past medical, surgical, family and social history reviewed and updated as indicated. Interim medical history since our last visit reviewed. Allergies and medications reviewed and updated.  Review of Systems  Constitutional:  Negative for activity change, appetite change, diaphoresis, fatigue and fever.  Respiratory:  Negative for cough, chest tightness and shortness of breath.   Cardiovascular:  Negative for chest pain, palpitations and leg swelling.  Gastrointestinal: Negative.   Endocrine: Negative for cold intolerance, heat intolerance, polydipsia, polyphagia and polyuria.  Musculoskeletal:  Positive for neck pain. Negative for neck stiffness.  Neurological:  Negative for dizziness, syncope,  weakness, light-headedness, numbness and headaches.  Psychiatric/Behavioral: Negative.     Per HPI unless specifically indicated above     Objective:    BP 109/71   Pulse 71   Temp 97.9 F (36.6 C) (Oral)   Ht 5' 2.01" (1.575 m)   Wt (!) 333 lb 5.8 oz (151.2 kg)   SpO2 97%   BMI 60.96 kg/m   Wt Readings from Last 3 Encounters:  06/20/23 (!) 333 lb 5.8 oz (151.2 kg)  05/03/23 (!) 335 lb (152 kg)  04/06/23 (!) 335 lb 12.8 oz (152.3 kg)    Physical Exam Vitals and nursing note reviewed.  Constitutional:      General: She is awake. She is not in acute distress.    Appearance: She is well-developed and well-groomed. She is morbidly obese. She is not ill-appearing.  HENT:     Head: Normocephalic.     Right Ear: Hearing normal.     Left Ear: Hearing normal.  Eyes:     General: Lids are normal.        Right eye: No discharge.        Left eye: No discharge.     Conjunctiva/sclera: Conjunctivae normal.     Pupils: Pupils are equal, round, and reactive to light.  Neck:     Thyroid: No thyromegaly.     Vascular: No carotid bruit.  Cardiovascular:     Rate and Rhythm: Normal rate and regular rhythm.     Heart sounds: Normal heart sounds. No murmur heard.  No gallop.  Pulmonary:     Effort: Pulmonary effort is normal. No accessory muscle usage or respiratory distress.     Breath sounds: Normal breath sounds.  Abdominal:     General: Bowel sounds are normal.     Palpations: Abdomen is soft.  Musculoskeletal:     Right shoulder: Normal.     Left shoulder: Tenderness (alng posterior aspect) present. No swelling, laceration or crepitus. Normal range of motion. Decreased strength (some mild weakness when arm outward with discomfort). Normal pulse.     Cervical back: Normal range of motion and neck supple. No edema, torticollis or crepitus. Pain with movement (to left mild) and muscular tenderness (along left shoulder posterior) present. No spinous process tenderness. Normal range  of motion.     Right lower leg: No edema.     Left lower leg: No edema.  Skin:    General: Skin is warm and dry.  Neurological:     Mental Status: She is alert and oriented to person, place, and time.  Psychiatric:        Attention and Perception: Attention normal.        Mood and Affect: Mood normal.        Speech: Speech normal.        Behavior: Behavior normal. Behavior is cooperative.        Thought Content: Thought content normal.     Results for orders placed or performed in visit on 04/06/23  Bayer DCA Hb A1c Waived  Result Value Ref Range   HB A1C (BAYER DCA - WAIVED) 6.3 (H) 4.8 - 5.6 %  Microalbumin, Urine Waived  Result Value Ref Range   Microalb, Ur Waived 30 (H) 0 - 19 mg/L   Creatinine, Urine Waived 200 10 - 300 mg/dL   Microalb/Creat Ratio <30 <30 mg/g  Comprehensive metabolic panel  Result Value Ref Range   Glucose 108 (H) 70 - 99 mg/dL   BUN 10 6 - 24 mg/dL   Creatinine, Ser 4.09 0.57 - 1.00 mg/dL   eGFR 811 >91 YN/WGN/5.62   BUN/Creatinine Ratio 17 9 - 23   Sodium 141 134 - 144 mmol/L   Potassium 4.1 3.5 - 5.2 mmol/L   Chloride 103 96 - 106 mmol/L   CO2 24 20 - 29 mmol/L   Calcium 8.9 8.7 - 10.2 mg/dL   Total Protein 6.7 6.0 - 8.5 g/dL   Albumin 3.7 (L) 3.9 - 4.9 g/dL   Globulin, Total 3.0 1.5 - 4.5 g/dL   Albumin/Globulin Ratio 1.2 1.2 - 2.2   Bilirubin Total 0.8 0.0 - 1.2 mg/dL   Alkaline Phosphatase 90 44 - 121 IU/L   AST 15 0 - 40 IU/L   ALT 6 0 - 32 IU/L  Lipid Panel w/o Chol/HDL Ratio  Result Value Ref Range   Cholesterol, Total 115 100 - 199 mg/dL   Triglycerides 91 0 - 149 mg/dL   HDL 45 >13 mg/dL   VLDL Cholesterol Cal 18 5 - 40 mg/dL   LDL Chol Calc (NIH) 52 0 - 99 mg/dL      Assessment & Plan:   Problem List Items Addressed This Visit       Other   Neck pain - Primary    Acute for 4 weeks with no benefit from current regimen.  No red flags.  At this time will trial Meloxicam 7.5 MG daily + change to Robaxin which may make her  less fatigued.  Solu Medrol injection in office  today for radiculopathy -- tolerates this but not steroid taper.  Recommend she monitor sugars closely with this.  Plan for return in 3 weeks, if ongoing pain then obtain imaging.      Relevant Medications   methylPREDNISolone sodium succinate (SOLU-MEDROL) 40 mg/mL injection 40 mg     Follow up plan: Return in about 3 weeks (around 07/11/2023) for NECK PAIN.

## 2023-06-28 ENCOUNTER — Ambulatory Visit: Payer: Managed Care, Other (non HMO) | Admitting: Nurse Practitioner

## 2023-07-09 NOTE — Patient Instructions (Signed)
Be Involved in Caring For Your Health:  Taking Medications When medications are taken as directed, they can greatly improve your health. But if they are not taken as prescribed, they may not work. In some cases, not taking them correctly can be harmful. To help ensure your treatment remains effective and safe, understand your medications and how to take them. Bring your medications to each visit for review by your provider.  Your lab results, notes, and after visit summary will be available on My Chart. We strongly encourage you to use this feature. If lab results are abnormal the clinic will contact you with the appropriate steps. If the clinic does not contact you assume the results are satisfactory. You can always view your results on My Chart. If you have questions regarding your health or results, please contact the clinic during office hours. You can also ask questions on My Chart.  We at [facility] are grateful that you chose [place] to provide your care. We strive to provide evidence-based and compassionate care and are always looking for feedback. If you get a survey from the clinic please complete this so we can hear your opinions.  Cervical Radiculopathy  Cervical radiculopathy happens when a nerve in the neck (a cervical nerve) is pinched or bruised. This condition can happen because of an injury to the cervical spine (vertebrae) in the neck, or as part of the normal aging process. Pressure on the cervical nerves can cause pain or numbness that travels from the neck all the way down to the arm and fingers. This condition usually gets better with rest. Treatment may be needed if the condition does not improve. What are the causes? This condition may be caused by: A neck injury. A bulging (herniated) disk. Muscle spasms. Muscle tightness in the neck due to overuse. Arthritis. Breakdown or degeneration in the bones and joints of the spine (spondylosis) due to aging. Bone spurs  that may develop near the cervical nerves. What are the signs or symptoms? Symptoms of this condition include: Pain. The pain may travel from the neck to the arm and hand. The pain can be severe or irritating. It may get worse when you move your neck. Numbness or tingling in your arm or hand. Weakness in the affected arm and hand, in severe cases. How is this diagnosed? This condition may be diagnosed based on your symptoms, your medical history, and a physical exam. You may also have tests, including: X-rays. CT scan. MRI. Electromyogram (EMG). Nerve conduction tests. How is this treated? In many cases, treatment is not needed for this condition. With rest, the condition usually gets better over time. If treatment is needed, options may include: Wearing a soft neck collar (cervical collar) for short periods of time. Doing physical therapy to strengthen your neck muscles. Taking medicines. These may include NSAIDs, such as ibuprofen, or oral corticosteroids. Having spinal injections, in severe cases. Having surgery. This may be needed if other treatments do not help. Different types of surgery may be done depending on the cause of this condition. Follow these instructions at home: If you have a cervical collar: Wear it as told by your health care provider. Remove it only as told by your health care provider. Ask your health care provider if you can remove the cervical collar for cleaning and bathing. If you are allowed to remove the collar for cleaning or bathing: Follow instructions from your health care provider about how to remove the collar safely. Clean the  collar by wiping it with mild soap and water and drying it completely. Take out any removable pads in the collar every 1-2 days, and wash them by hand with soap and water. Let them air-dry completely before you put them back in the collar. Check your skin under the collar for irritation or sores. If you see any, tell your health  care provider. Managing pain     Take over-the-counter and prescription medicines only as told by your health care provider. If directed, put ice on the affected area. To do this: If you have a soft neck collar, remove it as told by your health care provider. Put ice in a plastic bag. Place a towel between your skin and the bag. Leave the ice on for 20 minutes, 2-3 times a day. Remove the ice if your skin turns bright red. This is very important. If you cannot feel pain, heat, or cold, you have a greater risk of damage to the area. If applying ice does not help, you can try using heat. Use the heat source that your health care provider recommends, such as a moist heat pack or a heating pad. Place a towel between your skin and the heat source. Leave the heat on for 20-30 minutes. Remove the heat if your skin turns bright red. This is especially important if you are unable to feel pain, heat, or cold. You have a greater risk of getting burned. Try a gentle neck and shoulder massage to help relieve symptoms. Activity Rest as needed. Return to your normal activities as told by your health care provider. Ask your health care provider what activities are safe for you. Do stretching and strengthening exercises as told by your health care provider or your physical therapist. You may have to avoid lifting. Ask your health care provider how much you can safely lift. General instructions Use a flat pillow when you sleep. Do not drive while wearing a cervical collar. If you do not have a cervical collar, ask your health care provider if it is safe to drive while your neck heals. Ask your health care provider if the medicine prescribed to you requires you to avoid driving or using machinery. Do not use any products that contain nicotine or tobacco. These products include cigarettes, chewing tobacco, and vaping devices, such as e-cigarettes. If you need help quitting, ask your health care provider. Keep  all follow-up visits. This is important. Contact a health care provider if: Your condition does not improve with treatment. Get help right away if: Your pain gets much worse and is not controlled with medicines. You have weakness or numbness in your hand, arm, face, or leg. You have a high fever. You have a stiff, rigid neck. You lose control of your bowels or your bladder (have incontinence). You have trouble with walking, balance, or speaking. Summary Cervical radiculopathy happens when a nerve in the neck is pinched or bruised. A nerve can get pinched from a bulging disk, arthritis, muscle spasms, or an injury to the neck. Symptoms include pain, tingling, or numbness radiating from the neck to the arm or hand. Weakness can also occur in severe cases. Treatment may include rest, wearing a cervical collar, and physical therapy. Medicines may be prescribed to help with pain. In severe cases, injections or surgery may be needed. This information is not intended to replace advice given to you by your health care provider. Make sure you discuss any questions you have with your health care provider. Document  Revised: 06/04/2021 Document Reviewed: 06/04/2021 Elsevier Patient Education  2024 [employer].

## 2023-07-11 ENCOUNTER — Ambulatory Visit: Payer: Managed Care, Other (non HMO) | Admitting: Nurse Practitioner

## 2023-07-11 ENCOUNTER — Encounter: Payer: Self-pay | Admitting: Nurse Practitioner

## 2023-07-11 VITALS — BP 127/73 | HR 69 | Temp 97.6°F | Ht 62.01 in | Wt 332.2 lb

## 2023-07-11 DIAGNOSIS — M542 Cervicalgia: Secondary | ICD-10-CM

## 2023-07-11 NOTE — Assessment & Plan Note (Addendum)
Ongoing with no improvement.  No red flags.  She wishes to continue simple treatments at home, but we will get imaging of cervical spine and left shoulder to further assess.  Avoid Prednisone, she does not tolerate.  Discussed with patient, dependent on imaging may consider PT -- she wishes to wait until imaging returns.  If ongoing after PT performed, then consider MRI and Ortho referral.

## 2023-07-11 NOTE — Progress Notes (Signed)
BP 127/73   Pulse 69   Temp 97.6 F (36.4 C) (Oral)   Ht 5' 2.01" (1.575 m)   Wt (!) 332 lb 3.2 oz (150.7 kg)   BMI 60.74 kg/m    Subjective:    Patient ID: Amy [name], female    DOB: 01-Apr-1976, 47 y.o.   MRN: [id number]  HPI: Amy [name] is a 47 y.o. female  Chief Complaint  Patient presents with   Neck Pain    Patient states that her neck pain is about the same, has not seen any improvement in pain   NECK PAIN  Follow-up today for neck pain.  Was seen on 06/20/23 and started Meloxicam + provided Solu Medrol in office.  These are not offering no benefit.  Started two months ago with neck pain -- went to provider at her workplace was provided muscle relaxer + Tramadol -- this helped a little but did not make it go away.  Last A1c was 6.3% -- sugar levels have been stable at home.  No recent injuries.  Goes to chiropractor, but this is not helping.  Does not do well with steroid taper -- have not ordered this. Treatments attempted: Flexeril, Tramadol, ice, Tylenol, Meloxicam, Solu Medrol, and chiropractor Compliant with recommended treatment: yes Relief with NSAIDs?:  no difference Location:Left and midline Duration:weeks Severity: 8/10 at worst, 5/10 at present Quality: dull, aching, pressure-like, and throbbing Frequency: always there, but not always severe Radiation: L arm all the way to fingers Aggravating factors: none Alleviating factors: ice Weakness:  no Paresthesias / decreased sensation:  no  Fevers:  no   Relevant past medical, surgical, family and social history reviewed and updated as indicated. Interim medical history since our last visit reviewed. Allergies and medications reviewed and updated.  Review of Systems  Constitutional:  Negative for activity change, appetite change, diaphoresis, fatigue and fever.  Respiratory:  Negative for cough, chest tightness and shortness of breath.   Cardiovascular:  Negative for chest pain, palpitations and  leg swelling.  Gastrointestinal: Negative.   Endocrine: Negative for cold intolerance, heat intolerance, polydipsia, polyphagia and polyuria.  Musculoskeletal:  Positive for neck pain. Negative for neck stiffness.  Neurological:  Negative for dizziness, syncope, weakness, light-headedness, numbness and headaches.  Psychiatric/Behavioral: Negative.     Per HPI unless specifically indicated above     Objective:    BP 127/73   Pulse 69   Temp 97.6 F (36.4 C) (Oral)   Ht 5' 2.01" (1.575 m)   Wt (!) 332 lb 3.2 oz (150.7 kg)   BMI 60.74 kg/m   Wt Readings from Last 3 Encounters:  07/11/23 (!) 332 lb 3.2 oz (150.7 kg)  06/20/23 (!) 333 lb 5.8 oz (151.2 kg)  05/03/23 (!) 335 lb (152 kg)    Physical Exam Vitals and nursing note reviewed.  Constitutional:      General: She is awake. She is not in acute distress.    Appearance: She is well-developed and well-groomed. She is morbidly obese. She is not ill-appearing.  HENT:     Head: Normocephalic.     Right Ear: Hearing normal.     Left Ear: Hearing normal.  Eyes:     General: Lids are normal.        Right eye: No discharge.        Left eye: No discharge.     Conjunctiva/sclera: Conjunctivae normal.     Pupils: Pupils are equal, round, and reactive to light.  Neck:  Thyroid: No thyromegaly.     Vascular: No carotid bruit.  Cardiovascular:     Rate and Rhythm: Normal rate and regular rhythm.     Heart sounds: Normal heart sounds. No murmur heard.    No gallop.  Pulmonary:     Effort: Pulmonary effort is normal. No accessory muscle usage or respiratory distress.     Breath sounds: Normal breath sounds.  Abdominal:     General: Bowel sounds are normal.     Palpations: Abdomen is soft.  Musculoskeletal:     Right shoulder: Normal.     Left shoulder: Tenderness (along posterior aspect up to neck) present. No swelling, laceration or crepitus. Normal range of motion. Decreased strength (some mild weakness when arm outward).  Normal pulse.     Cervical back: Normal range of motion and neck supple. No edema, torticollis or crepitus. Pain with movement (to left mild) and muscular tenderness (along left shoulder posterior) present. No spinous process tenderness. Normal range of motion.     Right lower leg: No edema.     Left lower leg: No edema.  Skin:    General: Skin is warm and dry.  Neurological:     Mental Status: She is alert and oriented to person, place, and time.  Psychiatric:        Attention and Perception: Attention normal.        Mood and Affect: Mood normal.        Speech: Speech normal.        Behavior: Behavior normal. Behavior is cooperative.        Thought Content: Thought content normal.     Results for orders placed or performed in visit on 04/06/23  Bayer DCA Hb A1c Waived  Result Value Ref Range   HB A1C (BAYER DCA - WAIVED) 6.3 (H) 4.8 - 5.6 %  Microalbumin, Urine Waived  Result Value Ref Range   Microalb, Ur Waived 30 (H) 0 - 19 mg/L   Creatinine, Urine Waived 200 10 - 300 mg/dL   Microalb/Creat Ratio <30 <30 mg/g  Comprehensive metabolic panel  Result Value Ref Range   Glucose 108 (H) 70 - 99 mg/dL   BUN 10 6 - 24 mg/dL   Creatinine, Ser 4.13 0.57 - 1.00 mg/dL   eGFR 244 >01 UU/VOZ/3.66   BUN/Creatinine Ratio 17 9 - 23   Sodium 141 134 - 144 mmol/L   Potassium 4.1 3.5 - 5.2 mmol/L   Chloride 103 96 - 106 mmol/L   CO2 24 20 - 29 mmol/L   Calcium 8.9 8.7 - 10.2 mg/dL   Total Protein 6.7 6.0 - 8.5 g/dL   Albumin 3.7 (L) 3.9 - 4.9 g/dL   Globulin, Total 3.0 1.5 - 4.5 g/dL   Albumin/Globulin Ratio 1.2 1.2 - 2.2   Bilirubin Total 0.8 0.0 - 1.2 mg/dL   Alkaline Phosphatase 90 44 - 121 IU/L   AST 15 0 - 40 IU/L   ALT 6 0 - 32 IU/L  Lipid Panel w/o Chol/HDL Ratio  Result Value Ref Range   Cholesterol, Total 115 100 - 199 mg/dL   Triglycerides 91 0 - 149 mg/dL   HDL 45 >44 mg/dL   VLDL Cholesterol Cal 18 5 - 40 mg/dL   LDL Chol Calc (NIH) 52 0 - 99 mg/dL      Assessment &  Plan:   Problem List Items Addressed This Visit       Other   Neck pain - Primary  Ongoing with no improvement.  No red flags.  She wishes to continue simple treatments at home, but we will get imaging of cervical spine and left shoulder to further assess.  Avoid Prednisone, she does not tolerate.  Discussed with patient, dependent on imaging may consider PT -- she wishes to wait until imaging returns.  If ongoing after PT performed, then consider MRI and Ortho referral.      Relevant Orders   DG Cervical Spine Complete   DG Shoulder Left      Follow up plan: Return in about 4 weeks (around 08/08/2023) for NECK PAIN.

## 2023-07-12 ENCOUNTER — Ambulatory Visit
Admission: RE | Admit: 2023-07-12 | Discharge: 2023-07-12 | Disposition: A | Discharge status: Home / Self Care | Payer: Managed Care, Other (non HMO) | Attending: Nurse Practitioner | Admitting: Nurse Practitioner

## 2023-07-12 ENCOUNTER — Ambulatory Visit
Admission: RE | Admit: 2023-07-12 | Discharge: 2023-07-12 | Disposition: A | Discharge status: Home / Self Care | Payer: Managed Care, Other (non HMO) | Source: Ambulatory Visit | Attending: Nurse Practitioner | Admitting: Nurse Practitioner

## 2023-07-12 DIAGNOSIS — M542 Cervicalgia: Secondary | ICD-10-CM | POA: Diagnosis present

## 2023-07-13 NOTE — Progress Notes (Signed)
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  I would recommend we get you into physical therapy.  Do you want to pursue this? Let me know. Keep being amazing!!  Thank you for allowing me to participate in your care.  I appreciate you. Kindest regards, Jalaine Riggenbach

## 2023-07-19 NOTE — Progress Notes (Signed)
Contacted via MyChart   Shoulder imaging is stable, suspect this is more from neck and do recommend PT:)

## 2023-08-06 NOTE — Patient Instructions (Signed)
Cervical Radiculopathy  Cervical radiculopathy means that a nerve in the neck (a cervical nerve) is pinched or bruised. This can happen because of an injury to the cervical spine (vertebrae) in the neck, or as a normal part of getting older. This condition can cause pain or loss of feeling (numbness) that runs from your neck all the way down to your arm and fingers. Often, this condition gets better with rest. Treatment may be needed if the condition does not get better. What are the causes? A neck injury. A bulging disk in your spine. Sudden muscle tightening (muscle spasms). Tight muscles in your neck due to overuse. Arthritis. Breakdown in the bones and joints of the spine (spondylosis) due to getting older. Bone spurs that form near the nerves in the neck. What are the signs or symptoms? Pain. The pain may: Run from the neck to the arm and hand. Be very bad or irritating. Get worse when you move your neck. Loss of feeling or tingling in your arm or hand. Weakness in your arm or hand, in very bad cases. How is this treated? In many cases, treatment is not needed for this condition. With rest, the condition often gets better over time. If treatment is needed, options may include: Wearing a soft neck collar (cervical collar) for short periods of time. Doing exercises (physical therapy) to strengthen your neck muscles. Taking medicines. Having shots (injections) in your spine, in very bad cases. Having surgery. This may be needed if other treatments do not help. The type of surgery that is used will depend on the cause of your condition. Follow these instructions at home: If you have a soft neck collar: Wear it as told by your doctor. Take it off only as told by your doctor. Ask your doctor if you can take the collar off for cleaning and bathing. If you are allowed to take the collar off for cleaning or bathing: Follow instructions from your doctor about how to take off the collar  safely. Clean the collar by wiping it with mild soap and water and drying it completely. Take out any removable pads in the collar every 1-2 days. Wash them by hand with soap and water. Let them air-dry completely before you put them back in the collar. Check your skin under the collar for redness or sores. If you see any, tell your doctor. Managing pain     Take over-the-counter and prescription medicines only as told by your doctor. If told, put ice on the painful area. To do this: If you have a soft neck collar, take if off as told by your doctor. Put ice in a plastic bag. Place a towel between your skin and the bag. Leave the ice on for 20 minutes, 2-3 times a day. Take off the ice if your skin turns bright red. This is very important. If you cannot feel pain, heat, or cold, you have a greater risk of damage to the area. If using ice does not help, you can try using heat. Use the heat source that your doctor recommends, such as a moist heat pack or a heating pad. Place a towel between your skin and the heat source. Leave the heat on for 20-30 minutes. Take off the heat if your skin turns bright red. This is very important. If you cannot feel pain, heat, or cold, you have a greater risk of getting burned. You may try a gentle neck and shoulder rub (massage). Activity Rest as needed. Return  to your normal activities when your doctor says that it is safe. Do exercises as told by your doctor or physical therapist. You may have to avoid lifting. Ask your doctor how much you can safely lift. General instructions Use a flat pillow when you sleep. Do not drive while wearing a soft neck collar. If you do not have a soft neck collar, ask your doctor if it is safe to drive while your neck heals. Ask your doctor if you should avoid driving or using machines while you are taking your medicine. Do not smoke or use any products that contain nicotine or tobacco. If you need help quitting, ask your  doctor. Keep all follow-up visits. Contact a doctor if: Your condition does not get better with treatment. Get help right away if: Your pain gets worse and medicine does not help. You lose feeling or feel weak in your hand, arm, face, or leg. You have a high fever. Your neck is stiff. You cannot control when you poop or pee (have incontinence). You have trouble with walking, balance, or talking. Summary Cervical radiculopathy means that a nerve in the neck is pinched or bruised. A nerve can get pinched from a bulging disk, arthritis, an injury to the neck, or other causes. Symptoms include pain, tingling, or loss of feeling that goes from the neck to the arm or hand. Weakness in your arm or hand can happen in very bad cases. Treatment may include resting, wearing a soft neck collar, and doing exercises. You might need to take medicines for pain. In very bad cases, shots or surgery may be needed. This information is not intended to replace advice given to you by your health care provider. Make sure you discuss any questions you have with your health care provider. Document Revised: 06/04/2021 Document Reviewed: 06/04/2021 Elsevier Patient Education  2024 [employer].

## 2023-08-09 ENCOUNTER — Ambulatory Visit: Payer: Managed Care, Other (non HMO) | Admitting: Nurse Practitioner

## 2023-08-09 ENCOUNTER — Encounter: Payer: Self-pay | Admitting: Nurse Practitioner

## 2023-08-09 VITALS — BP 94/55 | HR 80 | Temp 97.9°F | Wt 334.6 lb

## 2023-08-09 DIAGNOSIS — M542 Cervicalgia: Secondary | ICD-10-CM

## 2023-08-09 DIAGNOSIS — Z23 Encounter for immunization: Secondary | ICD-10-CM | POA: Diagnosis not present

## 2023-08-09 NOTE — Assessment & Plan Note (Signed)
Ongoing, improving with ice and chiropractor adjustments.  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  No red flags.  Recommend she continue current treatment regimen as is offering benefit.  Avoid Prednisone, she does not tolerate.  If ongoing after PT performed, then consider MRI and Ortho referral.

## 2023-08-09 NOTE — Progress Notes (Signed)
BP (!) 94/55   Pulse 80   Temp 97.9 F (36.6 C)   Wt (!) 334 lb 9.6 oz (151.8 kg)   LMP 06/21/2023 (Approximate)   SpO2 96%   BMI 61.18 kg/m    Subjective:    Patient ID: Amy [name], female    DOB: 20-Dec-1975, 47 y.o.   MRN: [id number]  HPI: Amy [name] is a 47 y.o. female  Chief Complaint  Patient presents with   Neck Pain    4 week F/U   NECK PAIN  Follow-up today for neck pain, on 07/12/23 had imaging noting moderate degenerative to cervical spine.  Started two months ago with neck pain -- went to provider at her workplace was provided muscle relaxer + Tramadol, which helped some.  Was seen on 06/20/23 initially and started Meloxicam + provided Solu Medrol in office.  These did not offer much benefit.  No recent injuries. Goes to chiropractor, this is helping a little with neck pain.  Treatments attempted: Flexeril, Tramadol, ice, Tylenol, Meloxicam, Solu Medrol, and chiropractor Compliant with recommended treatment: yes Relief with NSAIDs?:  moderate with Meloxicam Location:Left and midline Duration:weeks Severity: 5/10 at worst, 0/10 at present Quality: dull, aching, pressure-like, and throbbing Frequency: intermittent Radiation: L arm all the way to fingers, but improving Aggravating factors: certain movements Alleviating factors: ice and chiropractor Weakness:  no Paresthesias / decreased sensation:  no  Fevers:  no   Relevant past medical, surgical, family and social history reviewed and updated as indicated. Interim medical history since our last visit reviewed. Allergies and medications reviewed and updated.  Review of Systems  Constitutional:  Negative for activity change, appetite change, diaphoresis, fatigue and fever.  Respiratory:  Negative for cough, chest tightness and shortness of breath.   Cardiovascular:  Negative for chest pain, palpitations and leg swelling.  Gastrointestinal: Negative.   Endocrine: Negative for cold intolerance,  heat intolerance, polydipsia, polyphagia and polyuria.  Musculoskeletal:  Positive for neck pain. Negative for neck stiffness.  Neurological:  Negative for dizziness, syncope, weakness, light-headedness, numbness and headaches.  Psychiatric/Behavioral: Negative.     Per HPI unless specifically indicated above     Objective:    BP (!) 94/55   Pulse 80   Temp 97.9 F (36.6 C)   Wt (!) 334 lb 9.6 oz (151.8 kg)   LMP 06/21/2023 (Approximate)   SpO2 96%   BMI 61.18 kg/m   Wt Readings from Last 3 Encounters:  08/09/23 (!) 334 lb 9.6 oz (151.8 kg)  07/11/23 (!) 332 lb 3.2 oz (150.7 kg)  06/20/23 (!) 333 lb 5.8 oz (151.2 kg)    Physical Exam Vitals and nursing note reviewed.  Constitutional:      General: She is awake. She is not in acute distress.    Appearance: She is well-developed and well-groomed. She is morbidly obese. She is not ill-appearing.  HENT:     Head: Normocephalic.     Right Ear: Hearing normal.     Left Ear: Hearing normal.  Eyes:     General: Lids are normal.        Right eye: No discharge.        Left eye: No discharge.     Conjunctiva/sclera: Conjunctivae normal.     Pupils: Pupils are equal, round, and reactive to light.  Neck:     Thyroid: No thyromegaly.     Vascular: No carotid bruit.  Cardiovascular:     Rate and Rhythm: Normal rate and regular  rhythm.     Heart sounds: Normal heart sounds. No murmur heard.    No gallop.  Pulmonary:     Effort: Pulmonary effort is normal. No accessory muscle usage or respiratory distress.     Breath sounds: Normal breath sounds.  Abdominal:     General: Bowel sounds are normal.     Palpations: Abdomen is soft.  Musculoskeletal:     Cervical back: Normal range of motion and neck supple. No edema, torticollis or crepitus. Pain with movement (to left mild, improving) and muscular tenderness (along left shoulder posterior) present. No spinous process tenderness. Normal range of motion.     Right lower leg: No edema.      Left lower leg: No edema.  Skin:    General: Skin is warm and dry.  Neurological:     Mental Status: She is alert and oriented to person, place, and time.  Psychiatric:        Attention and Perception: Attention normal.        Mood and Affect: Mood normal.        Speech: Speech normal.        Behavior: Behavior normal. Behavior is cooperative.        Thought Content: Thought content normal.     Results for orders placed or performed in visit on 04/06/23  Bayer DCA Hb A1c Waived  Result Value Ref Range   HB A1C (BAYER DCA - WAIVED) 6.3 (H) 4.8 - 5.6 %  Microalbumin, Urine Waived  Result Value Ref Range   Microalb, Ur Waived 30 (H) 0 - 19 mg/L   Creatinine, Urine Waived 200 10 - 300 mg/dL   Microalb/Creat Ratio <30 <30 mg/g  Comprehensive metabolic panel  Result Value Ref Range   Glucose 108 (H) 70 - 99 mg/dL   BUN 10 6 - 24 mg/dL   Creatinine, Ser 0.27 0.57 - 1.00 mg/dL   eGFR 253 >66 YQ/IHK/7.42   BUN/Creatinine Ratio 17 9 - 23   Sodium 141 134 - 144 mmol/L   Potassium 4.1 3.5 - 5.2 mmol/L   Chloride 103 96 - 106 mmol/L   CO2 24 20 - 29 mmol/L   Calcium 8.9 8.7 - 10.2 mg/dL   Total Protein 6.7 6.0 - 8.5 g/dL   Albumin 3.7 (L) 3.9 - 4.9 g/dL   Globulin, Total 3.0 1.5 - 4.5 g/dL   Albumin/Globulin Ratio 1.2 1.2 - 2.2   Bilirubin Total 0.8 0.0 - 1.2 mg/dL   Alkaline Phosphatase 90 44 - 121 IU/L   AST 15 0 - 40 IU/L   ALT 6 0 - 32 IU/L  Lipid Panel w/o Chol/HDL Ratio  Result Value Ref Range   Cholesterol, Total 115 100 - 199 mg/dL   Triglycerides 91 0 - 149 mg/dL   HDL 45 >59 mg/dL   VLDL Cholesterol Cal 18 5 - 40 mg/dL   LDL Chol Calc (NIH) 52 0 - 99 mg/dL      Assessment & Plan:   Problem List Items Addressed This Visit       Other   Neck pain - Primary    Ongoing, improving with ice and chiropractor adjustments.  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  No red flags.  Recommend she continue current treatment regimen as is offering  benefit.  Avoid Prednisone, she does not tolerate.  If ongoing after PT performed, then consider MRI and Ortho referral.      Other Visit Diagnoses  Need for influenza vaccination       Flu shot today after consent provided.   Relevant Orders   Flu vaccine trivalent PF, 6mos and older(Flulaval,Afluria,Fluarix,Fluzone) (Completed)         Follow up plan: Return for as scheduled October 25th.

## 2023-10-02 NOTE — Patient Instructions (Signed)
 Be Involved in Caring For Your Health:  Taking Medications When medications are taken as directed, they can greatly improve your health. But if they are not taken as prescribed, they may not work. In some cases, not taking them correctly can be harmful. To help ensure your treatment remains effective and safe, understand your medications and how to take them. Bring your medications to each visit for review by your provider.  Your lab results, notes, and after visit summary will be available on My Chart. We strongly encourage you to use this feature. If lab results are abnormal the clinic will contact you with the appropriate steps. If the clinic does not contact you assume the results are satisfactory. You can always view your results on My Chart. If you have questions regarding your health or results, please contact the clinic during office hours. You can also ask questions on My Chart.  We at [facility] are grateful that you chose [place] to provide your care. We strive to provide evidence-based and compassionate care and are always looking for feedback. If you get a survey from the clinic please complete this so we can hear your opinions.  Diabetes Mellitus and Nutrition, Adult When you have diabetes, or diabetes mellitus, it is very important to have healthy eating habits because your blood sugar (glucose) levels are greatly affected by what you eat and drink. Eating healthy foods in the right amounts, at about the same times every day, can help you: Manage your blood glucose. Lower your risk of heart disease. Improve your blood pressure. Reach or maintain a healthy weight. What can affect my meal plan? Every person with diabetes is different, and each person has different needs for a meal plan. Your health care provider may recommend that you work with a dietitian to make a meal plan that is best for you. Your meal plan may vary depending on factors such as: The calories you need. The  medicines you take. Your weight. Your blood glucose, blood pressure, and cholesterol levels. Your activity level. Other health conditions you have, such as heart or kidney disease. How do carbohydrates affect me? Carbohydrates, also called carbs, affect your blood glucose level more than any other type of food. Eating carbs raises the amount of glucose in your blood. It is important to know how many carbs you can safely have in each meal. This is different for every person. Your dietitian can help you calculate how many carbs you should have at each meal and for each snack. How does alcohol affect me? Alcohol can cause a decrease in blood glucose (hypoglycemia), especially if you use insulin or take certain diabetes medicines by mouth. Hypoglycemia can be a life-threatening condition. Symptoms of hypoglycemia, such as sleepiness, dizziness, and confusion, are similar to symptoms of having too much alcohol. Do not drink alcohol if: Your health care provider tells you not to drink. You are pregnant, may be pregnant, or are planning to become pregnant. If you drink alcohol: Limit how much you have to: 0-1 drink a day for women. 0-2 drinks a day for men. Know how much alcohol is in your drink. In the [place] one drink equals one 12 oz bottle of beer (355 mL), one 5 oz glass of wine (148 mL), or one 1 oz glass of hard liquor (44 mL). Keep yourself hydrated with water, diet soda, or unsweetened iced tea. Keep in mind that regular soda, juice, and other mixers may contain a lot of sugar and must  be counted as carbs. What are tips for following this plan?  Reading food labels Start by checking the serving size on the Nutrition Facts label of packaged foods and drinks. The number of calories and the amount of carbs, fats, and other nutrients listed on the label are based on one serving of the item. Many items contain more than one serving per package. Check the total grams (g) of carbs in one  serving. Check the number of grams of saturated fats and trans fats in one serving. Choose foods that have a low amount or none of these fats. Check the number of milligrams (mg) of salt (sodium) in one serving. Most people should limit total sodium intake to less than 2,300 mg per day. Always check the nutrition information of foods labeled as "low-fat" or "nonfat." These foods may be higher in added sugar or refined carbs and should be avoided. Talk to your dietitian to identify your daily goals for nutrients listed on the label. Shopping Avoid buying canned, pre-made, or processed foods. These foods tend to be high in fat, sodium, and added sugar. Shop around the outside edge of the grocery store. This is where you will most often find fresh fruits and vegetables, bulk grains, fresh meats, and fresh dairy products. Cooking Use low-heat cooking methods, such as baking, instead of high-heat cooking methods, such as deep frying. Cook using healthy oils, such as olive, canola, or sunflower oil. Avoid cooking with butter, cream, or high-fat meats. Meal planning Eat meals and snacks regularly, preferably at the same times every day. Avoid going long periods of time without eating. Eat foods that are high in fiber, such as fresh fruits, vegetables, beans, and whole grains. Eat 4-6 oz (112-168 g) of lean protein each day, such as lean meat, chicken, fish, eggs, or tofu. One ounce (oz) (28 g) of lean protein is equal to: 1 oz (28 g) of meat, chicken, or fish. 1 egg.  cup (62 g) of tofu. Eat some foods each day that contain healthy fats, such as avocado, nuts, seeds, and fish. What foods should I eat? Fruits Berries. Apples. Oranges. Peaches. Apricots. Plums. Grapes. Mangoes. Papayas. Pomegranates. Kiwi. Cherries. Vegetables Leafy greens, including lettuce, spinach, kale, chard, collard greens, mustard greens, and cabbage. Beets. Cauliflower. Broccoli. Carrots. Green beans. Tomatoes. Peppers.  Onions. Cucumbers. Brussels sprouts. Grains Whole grains, such as whole-wheat or whole-grain bread, crackers, tortillas, cereal, and pasta. Unsweetened oatmeal. Quinoa. Brown or wild rice. Meats and other proteins Seafood. Poultry without skin. Lean cuts of poultry and beef. Tofu. Nuts. Seeds. Dairy Low-fat or fat-free dairy products such as milk, yogurt, and cheese. The items listed above may not be a complete list of foods and beverages you can eat and drink. Contact a dietitian for more information. What foods should I avoid? Fruits Fruits canned with syrup. Vegetables Canned vegetables. Frozen vegetables with butter or cream sauce. Grains Refined white flour and flour products such as bread, pasta, snack foods, and cereals. Avoid all processed foods. Meats and other proteins Fatty cuts of meat. Poultry with skin. Breaded or fried meats. Processed meat. Avoid saturated fats. Dairy Full-fat yogurt, cheese, or milk. Beverages Sweetened drinks, such as soda or iced tea. The items listed above may not be a complete list of foods and beverages you should avoid. Contact a dietitian for more information. Questions to ask a health care provider Do I need to meet with a certified diabetes care and education specialist? Do I need to meet with a  dietitian? What number can I call if I have questions? When are the best times to check my blood glucose? Where to find more information: American Diabetes Association: diabetes.org Academy of Nutrition and Dietetics: eatright[employer] of Diabetes and Digestive and Kidney Diseases: [email or web address] Association of Diabetes Care & Education Specialists: diabeteseducator.org Summary It is important to have healthy eating habits because your blood sugar (glucose) levels are greatly affected by what you eat and drink. It is important to use alcohol carefully. A healthy meal plan will help you manage your blood glucose and lower your risk of  heart disease. Your health care provider may recommend that you work with a dietitian to make a meal plan that is best for you. This information is not intended to replace advice given to you by your health care provider. Make sure you discuss any questions you have with your health care provider. Document Revised: 07/02/2020 Document Reviewed: 07/02/2020 Elsevier Patient Education  2024 [employer].

## 2023-10-07 ENCOUNTER — Ambulatory Visit (INDEPENDENT_AMBULATORY_CARE_PROVIDER_SITE_OTHER): Payer: Managed Care, Other (non HMO) | Admitting: Nurse Practitioner

## 2023-10-07 ENCOUNTER — Encounter: Payer: Self-pay | Admitting: Nurse Practitioner

## 2023-10-07 DIAGNOSIS — Z0000 Encounter for general adult medical examination without abnormal findings: Secondary | ICD-10-CM

## 2023-10-07 DIAGNOSIS — E039 Hypothyroidism, unspecified: Secondary | ICD-10-CM

## 2023-10-07 DIAGNOSIS — E1169 Type 2 diabetes mellitus with other specified complication: Secondary | ICD-10-CM | POA: Diagnosis not present

## 2023-10-07 DIAGNOSIS — I479 Paroxysmal tachycardia, unspecified: Secondary | ICD-10-CM | POA: Diagnosis not present

## 2023-10-07 DIAGNOSIS — Z7984 Long term (current) use of oral hypoglycemic drugs: Secondary | ICD-10-CM

## 2023-10-07 DIAGNOSIS — E785 Hyperlipidemia, unspecified: Secondary | ICD-10-CM

## 2023-10-07 DIAGNOSIS — E1159 Type 2 diabetes mellitus with other circulatory complications: Secondary | ICD-10-CM

## 2023-10-07 DIAGNOSIS — I152 Hypertension secondary to endocrine disorders: Secondary | ICD-10-CM

## 2023-10-07 DIAGNOSIS — E538 Deficiency of other specified B group vitamins: Secondary | ICD-10-CM

## 2023-10-07 DIAGNOSIS — E559 Vitamin D deficiency, unspecified: Secondary | ICD-10-CM

## 2023-10-07 LAB — BAYER DCA HB A1C WAIVED: HB A1C (BAYER DCA - WAIVED): 5.8 % — ABNORMAL HIGH (ref 4.8–5.6)

## 2023-10-07 MED ORDER — ROSUVASTATIN CALCIUM 10 MG PO TABS: 10.0000 mg | ORAL_TABLET | ORAL | 4 refills | Status: DC

## 2023-10-07 MED ORDER — NEBIVOLOL HCL 5 MG PO TABS: 5.0000 mg | ORAL_TABLET | Freq: Every day | ORAL | 4 refills | Status: DC

## 2023-10-07 MED ORDER — METFORMIN HCL 500 MG PO TABS: 500.0000 mg | ORAL_TABLET | Freq: Every day | ORAL | 4 refills | Status: DC

## 2023-10-07 MED ORDER — VITAMIN D (ERGOCALCIFEROL) 1.25 MG (50000 UNIT) PO CAPS: 50000.0000 [IU] | ORAL_CAPSULE | ORAL | 4 refills | Status: DC

## 2023-10-07 MED ORDER — LEVOTHYROXINE SODIUM 50 MCG PO TABS: 50.0000 ug | ORAL_TABLET | Freq: Every day | ORAL | 4 refills | Status: DC

## 2023-10-07 MED ORDER — LOSARTAN POTASSIUM 25 MG PO TABS: 25.0000 mg | ORAL_TABLET | Freq: Every day | ORAL | 4 refills | Status: DC

## 2023-10-07 NOTE — Progress Notes (Signed)
BP 130/80   Pulse 71   Temp 97.7 F (36.5 C) (Oral)   Ht 5\' 2"  (1.575 m)   Wt (!) 333 lb (151 kg)   LMP 10/02/2023 (Exact Date)   SpO2 97%   BMI 60.91 kg/m    Subjective:    Patient ID: Amy [name], female    DOB: [birth year], 47 y.o.   MRN: [id number]  HPI: Amy [name] is a 47 y.o. female presenting on 10/07/2023 for comprehensive medical examination. Current medical complaints include:none  She currently lives with: husband Menopausal Symptoms: no  DIABETES Last A1c April 6.3%.  Taking Metformin 500 MG at night.   Hypoglycemic episodes:no Polydipsia/polyuria: no Visual disturbance: no Chest pain: no Paresthesias: no Glucose Monitoring: yes             Accucheck frequency: Daily             Fasting glucose: 81 to 120 range             Post prandial:             Evening:             Before meals: Taking Insulin?: no             Long acting insulin:             Short acting insulin: Blood Pressure Monitoring: daily Retinal Examination: Up to Date -- Dr. Alvester Morin goes every 6 months Foot Exam: Up to Date Pneumovax: Up to Date Influenza: Up to Date Aspirin: no    HYPERTENSION / HYPERLIPIDEMIA Continues Losartan 25 MG daily, Bystolic 5 MG daily, and Crestor 10 MG daily. Has visits with Dr. Mariah Milling, cardiology, due to tachycardia -- last saw 09/10/22, no changes.  Has visit on 10/14/23. Satisfied with current treatment? yes Duration of hypertension: chronic BP monitoring frequency: daily BP range: 99/47 to 116/61 BP medication side effects: no Past BP meds: as above Duration of hyperlipidemia: chronic Cholesterol medication side effects: no Cholesterol supplements: none Past cholesterol medications: none Medication compliance: good compliance Aspirin: no Recent stressors: no Recurrent headaches: no Visual changes: no Palpitations: no Dyspnea: no Chest pain: no Lower extremity edema: no Dizzy/lightheaded: no  The ASCVD Risk score (Arnett DK, et al.,  2019) failed to calculate for the following reasons:   The valid total cholesterol range is 130 to 320 mg/dL  HYPOTHYROIDISM Taking Levothyroxine 50 MCG.  Taking Vitamin D weekly with history of low levels.   Thyroid control status:stable Satisfied with current treatment? yes Medication side effects: no Medication compliance: good compliance Etiology of hypothyroidism:  Recent dose adjustment:no Fatigue: no Cold intolerance: no Heat intolerance: no Weight gain: no Weight loss: no Constipation: no Diarrhea/loose stools: no Palpitations: no Lower extremity edema: no Anxiety/depressed mood: no   Depression Screen done today and results listed below:     10/07/2023    8:24 AM 07/11/2023   10:25 AM 06/20/2023    9:50 AM 05/03/2023    1:12 PM 04/06/2023    8:35 AM  Depression screen PHQ 2/9  Decreased Interest 0 0 0 0 0  Down, Depressed, Hopeless 0 0 0 0 0  PHQ - 2 Score 0 0 0 0 0  Altered sleeping 0 0 0 0 0  Tired, decreased energy 0 0 0 0 1  Change in appetite 0 0 0 0 0  Feeling bad or failure about yourself  0 0 0 0 0  Trouble concentrating 0 0 0 0 0  Moving slowly or fidgety/restless 0 0 0 0 0  Suicidal thoughts 0 0 0 0 0  PHQ-9 Score 0 0 0 0 1  Difficult doing work/chores Not difficult at all Not difficult at all Not difficult at all Not difficult at all Somewhat difficult      10/07/2023    8:24 AM 07/11/2023   10:25 AM 06/20/2023    9:50 AM 04/06/2023    8:36 AM  GAD 7 : Generalized Anxiety Score  Nervous, Anxious, on Edge 0 0 0 0  Control/stop worrying 0 0 0 0  Worry too much - different things 0 0 0 0  Trouble relaxing 0 0 0 0  Restless 0 0 0 0  Easily annoyed or irritable 0 0 0 0  Afraid - awful might happen 0 0 0 0  Total GAD 7 Score 0 0 0 0  Anxiety Difficulty Not difficult at all Not difficult at all Not difficult at all Not difficult at all      11/17/2022    9:12 AM 05/03/2023    1:06 PM 06/20/2023    9:50 AM 07/11/2023   10:25 AM 10/07/2023    8:24 AM   Fall Risk  Falls in the past year?  0 0 0 0  Was there an injury with Fall?  0 0 0 0  Fall Risk Category Calculator  0 0 0 0  (RETIRED) Patient Fall Risk Level Moderate fall risk      Patient at Risk for Falls Due to   No Fall Risks No Fall Risks No Fall Risks  Fall risk Follow up   Falls evaluation completed Falls evaluation completed Falls evaluation completed    Past Medical History:  Past Medical History:  Diagnosis Date   Allergy    Asthma    Diabetes mellitus without complication (HCC)    Essential hypertension    Obesity    Psoriasis    Sinus tachycardia    Thyroid disease 2021    Surgical History:  Past Surgical History:  Procedure Laterality Date   CESAREAN SECTION     X 2   COLONOSCOPY WITH PROPOFOL N/A 11/17/2022   Procedure: COLONOSCOPY WITH PROPOFOL;  Surgeon: Toney Reil, MD;  Location: [facility] ENDOSCOPY;  Service: Gastroenterology;  Laterality: N/A;   HAND SURGERY Right    THUMB   HERNIA REPAIR     right thumb surgery      Medications:  Current Outpatient Medications on File Prior to Visit  Medication Sig   levocetirizine (XYZAL) 5 MG tablet Take 1 tablet by mouth once daily   meloxicam (MOBIC) 7.5 MG tablet Take 1 tablet (7.5 mg total) by mouth daily.   methocarbamol (ROBAXIN) 500 MG tablet Take 1 tablet (500 mg total) by mouth every 8 (eight) hours as needed for muscle spasms.   Multiple Vitamin (MULTIVITAMINS PO) Take 1 tablet by mouth daily.    traMADol (ULTRAM) 50 MG tablet Take 50 mg by mouth 3 (three) times daily as needed.   No current facility-administered medications on file prior to visit.    Allergies:  Allergies  Allergen Reactions   Bee Venom Anaphylaxis   Biaxin [Clarithromycin] Nausea And Vomiting    Social History:  Social History   Socioeconomic History   Marital status: Married    Spouse name: Not on file   Number of children: Not on file   Years of education: Not on file   Highest education level: Associate degree:  occupational, Scientist, product/process development, or vocational  program  Occupational History   Not on file  Tobacco Use   Smoking status: Former    Current packs/day: 0.00    Average packs/day: 0.5 packs/day for 5.0 years (2.5 ttl pk-yrs)    Types: Cigarettes    Start date: 01/05/2000    Quit date: 01/04/2005    Years since quitting: 18.7   Smokeless tobacco: Never  Vaping Use   Vaping status: Never Used  Substance and Sexual Activity   Alcohol use: No   Drug use: No   Sexual activity: Yes    Comment: Tubal ligation  Other Topics Concern   Not on file  Social History Narrative   Not on file   Social Determinants of Health   Financial Resource Strain: Patient Declined (04/02/2023)   Overall Financial Resource Strain (CARDIA)    Difficulty of Paying Living Expenses: Patient declined  Food Insecurity: No Food Insecurity (04/02/2023)   Hunger Vital Sign    Worried About Running Out of Food in the Last Year: Never true    Ran Out of Food in the Last Year: Never true  Transportation Needs: No Transportation Needs (04/02/2023)   PRAPARE - Administrator, Civil Service (Medical): No    Lack of Transportation (Non-Medical): No  Physical Activity: Unknown (04/02/2023)   Exercise Vital Sign    Days of Exercise per Week: Patient declined    Minutes of Exercise per Session: Not on file  Stress: No Stress Concern Present (04/02/2023)   [employer] of Occupational Health - Occupational Stress Questionnaire    Feeling of Stress : Not at all  Social Connections: Socially Integrated (04/02/2023)   Social Connection and Isolation Panel [NHANES]    Frequency of Communication with Friends and Family: More than three times a week    Frequency of Social Gatherings with Friends and Family: Twice a week    Attends Religious Services: More than 4 times per year    Active Member of [employer] or Organizations: Yes    Attends Engineer, structural: Not on file    Marital Status: Married  Catering manager  Violence: Not on file   Social History   Tobacco Use  Smoking Status Former   Current packs/day: 0.00   Average packs/day: 0.5 packs/day for 5.0 years (2.5 ttl pk-yrs)   Types: Cigarettes   Start date: 01/05/2000   Quit date: 01/04/2005   Years since quitting: 18.7  Smokeless Tobacco Never   Social History   Substance and Sexual Activity  Alcohol Use No    Family History:  Family History  Problem Relation Age of Onset   Hypertension Mother    Thyroid disease Mother    Autoimmune disease Mother    Anemia Mother    Cirrhosis Father    Glaucoma Father    Thyroid disease Maternal Grandmother    Osteoporosis Maternal Grandmother    Hypertension Maternal Grandmother    Diabetes Maternal Grandmother    Congestive Heart Failure Paternal Grandmother    Appendicitis Paternal Grandfather    Asthma Daughter    Allergies Daughter    ADD / ADHD Son    Sleep apnea Neg Hx     Past medical history, surgical history, medications, allergies, family history and social history reviewed with patient today and changes made to appropriate areas of the chart.   ROS All other ROS negative except what is listed above and in the HPI.      Objective:    BP 130/80  Pulse 71   Temp 97.7 F (36.5 C) (Oral)   Ht 5\' 2"  (1.575 m)   Wt (!) 333 lb (151 kg)   LMP 10/02/2023 (Exact Date)   SpO2 97%   BMI 60.91 kg/m   Wt Readings from Last 3 Encounters:  10/07/23 (!) 333 lb (151 kg)  08/09/23 (!) 334 lb 9.6 oz (151.8 kg)  07/11/23 (!) 332 lb 3.2 oz (150.7 kg)    Physical Exam Vitals and nursing note reviewed. Exam conducted with a chaperone present.  Constitutional:      General: She is awake. She is not in acute distress.    Appearance: Normal appearance. She is well-developed and well-groomed. She is obese. She is not ill-appearing or toxic-appearing.  HENT:     Head: Normocephalic and atraumatic.     Right Ear: Hearing, tympanic membrane, ear canal and external ear normal. No  drainage.     Left Ear: Hearing, tympanic membrane, ear canal and external ear normal. No drainage.     Nose: Nose normal.     Right Sinus: No maxillary sinus tenderness or frontal sinus tenderness.     Left Sinus: No maxillary sinus tenderness or frontal sinus tenderness.     Mouth/Throat:     Mouth: Mucous membranes are moist.     Pharynx: Oropharynx is clear. Uvula midline. No pharyngeal swelling, oropharyngeal exudate or posterior oropharyngeal erythema.  Eyes:     General: Lids are normal.        Right eye: No discharge.        Left eye: No discharge.     Extraocular Movements: Extraocular movements intact.     Conjunctiva/sclera: Conjunctivae normal.     Pupils: Pupils are equal, round, and reactive to light.     Visual Fields: Right eye visual fields normal and left eye visual fields normal.  Neck:     Thyroid: No thyromegaly.     Vascular: No carotid bruit.     Trachea: Trachea normal.  Cardiovascular:     Rate and Rhythm: Normal rate and regular rhythm.     Heart sounds: Normal heart sounds. No murmur heard.    No gallop.  Pulmonary:     Effort: Pulmonary effort is normal. No accessory muscle usage or respiratory distress.     Breath sounds: Normal breath sounds.  Chest:  Breasts:    Right: Normal.     Left: Normal.  Abdominal:     General: Bowel sounds are normal.     Palpations: Abdomen is soft. There is no hepatomegaly or splenomegaly.     Tenderness: There is no abdominal tenderness.  Musculoskeletal:        General: Normal range of motion.     Cervical back: Normal range of motion and neck supple.     Right lower leg: No edema.     Left lower leg: No edema.  Lymphadenopathy:     Head:     Right side of head: No submental, submandibular, tonsillar, preauricular or posterior auricular adenopathy.     Left side of head: No submental, submandibular, tonsillar, preauricular or posterior auricular adenopathy.     Cervical: No cervical adenopathy.     Upper Body:      Right upper body: No supraclavicular, axillary or pectoral adenopathy.     Left upper body: No supraclavicular, axillary or pectoral adenopathy.  Skin:    General: Skin is warm and dry.     Capillary Refill: Capillary refill takes less than 2 seconds.  Findings: No rash.  Neurological:     Mental Status: She is alert and oriented to person, place, and time.     Gait: Gait is intact.     Deep Tendon Reflexes: Reflexes are normal and symmetric.     Reflex Scores:      Brachioradialis reflexes are 2+ on the right side and 2+ on the left side.      Patellar reflexes are 2+ on the right side and 2+ on the left side. Psychiatric:        Attention and Perception: Attention normal.        Mood and Affect: Mood normal.        Speech: Speech normal.        Behavior: Behavior normal. Behavior is cooperative.        Thought Content: Thought content normal.        Judgment: Judgment normal.    Results for orders placed or performed in visit on 04/06/23  Bayer DCA Hb A1c Waived  Result Value Ref Range   HB A1C (BAYER DCA - WAIVED) 6.3 (H) 4.8 - 5.6 %  Microalbumin, Urine Waived  Result Value Ref Range   Microalb, Ur Waived 30 (H) 0 - 19 mg/L   Creatinine, Urine Waived 200 10 - 300 mg/dL   Microalb/Creat Ratio <30 <30 mg/g  Comprehensive metabolic panel  Result Value Ref Range   Glucose 108 (H) 70 - 99 mg/dL   BUN 10 6 - 24 mg/dL   Creatinine, Ser 1.32 0.57 - 1.00 mg/dL   eGFR 440 >10 UV/OZD/6.64   BUN/Creatinine Ratio 17 9 - 23   Sodium 141 134 - 144 mmol/L   Potassium 4.1 3.5 - 5.2 mmol/L   Chloride 103 96 - 106 mmol/L   CO2 24 20 - 29 mmol/L   Calcium 8.9 8.7 - 10.2 mg/dL   Total Protein 6.7 6.0 - 8.5 g/dL   Albumin 3.7 (L) 3.9 - 4.9 g/dL   Globulin, Total 3.0 1.5 - 4.5 g/dL   Albumin/Globulin Ratio 1.2 1.2 - 2.2   Bilirubin Total 0.8 0.0 - 1.2 mg/dL   Alkaline Phosphatase 90 44 - 121 IU/L   AST 15 0 - 40 IU/L   ALT 6 0 - 32 IU/L  Lipid Panel w/o Chol/HDL Ratio  Result  Value Ref Range   Cholesterol, Total 115 100 - 199 mg/dL   Triglycerides 91 0 - 149 mg/dL   HDL 45 >40 mg/dL   VLDL Cholesterol Cal 18 5 - 40 mg/dL   LDL Chol Calc (NIH) 52 0 - 99 mg/dL      Assessment & Plan:   Problem List Items Addressed This Visit       Cardiovascular and Mediastinum   Hypertension associated with diabetes (HCC)    Chronic, stable.  BP at goal in office today and well below goal on home readings.  Continue Losartan 50 MG daily and Bystolic 5 MG to take at night. Tolerating medications well, may need to reduce in future if levels get too low.  Will continue collaboration with cardiology. Recommend she monitor BP at least a few mornings a week at home and document.  DASH diet at home. Labs: CBC, CMP, TSH.  Urine ALB 12 April 2023. Refills sent in.      Relevant Medications   losartan (COZAAR) 25 MG tablet   metFORMIN (GLUCOPHAGE) 500 MG tablet   nebivolol (BYSTOLIC) 5 MG tablet   rosuvastatin (CRESTOR) 10 MG tablet (Start on 10/10/2023)  Other Relevant Orders   CBC with Differential/Platelet   Paroxysmal tachycardia (HCC)    Stable, continue Bystolic as ordered by Dr. Mariah Milling, cardiology, is tolerating well.  Will not increase at this time, although per cardiology note can consider increase to 5 MG BID or 10 MG nightly if needed in future.  Overall her HR is remaining stable at this time with no symptoms.      Relevant Medications   losartan (COZAAR) 25 MG tablet   nebivolol (BYSTOLIC) 5 MG tablet   rosuvastatin (CRESTOR) 10 MG tablet (Start on 10/10/2023)     Endocrine   Hyperlipidemia associated with type 2 diabetes mellitus (HCC)    Chronic, stable.  Continue Rosuvastatin at 2 days a week which she is tolerating.  Lipid panel obtained today.  Refill sent in.      Relevant Medications   losartan (COZAAR) 25 MG tablet   metFORMIN (GLUCOPHAGE) 500 MG tablet   nebivolol (BYSTOLIC) 5 MG tablet   rosuvastatin (CRESTOR) 10 MG tablet (Start on 10/10/2023)    Other Relevant Orders   Comprehensive metabolic panel   Lipid Panel w/o Chol/HDL Ratio   Hypothyroid    Chronic, stable.  Continue current medication regimen and adjust as needed. Thyroid labs today.      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  Diagnosed in October 2021 -- A1c 5.8% at this time and urine ALB 12 April 2023.  Continue current medication regimen and adjust as needed + continue diet focus.  Continue to monitor BS daily.  Consider trial Ozempic, discussed with her as she is interested in weight loss and this would offer benefit to that.  She will review and let provider know if she wishes to trial.  Would stop Metformin if Ozempic brought on board.      Relevant Medications   losartan (COZAAR) 25 MG tablet   metFORMIN (GLUCOPHAGE) 500 MG tablet   rosuvastatin (CRESTOR) 10 MG tablet (Start on 10/10/2023)   Other Relevant Orders   Bayer DCA Hb A1c Waived (STAT)     Other   B12 deficiency    History of lows, but recent was stable.  Noted on past labs and is not taking supplement at present.  Will check level today.      Relevant Orders   Vitamin B12   Morbid obesity (HCC)    BMI 61.91 with T2DM, HTN/HLD.  Recommended eating smaller high protein, low fat meals more frequently and exercising 30 mins a day 5 times a week with a goal of 10-15lb weight loss in the next 3 months. Patient voiced their understanding and motivation to adhere to these recommendations. Referral to weight management placed per request, she did not hear from previous referral.       Relevant Medications   metFORMIN (GLUCOPHAGE) 500 MG tablet   Other Relevant Orders   Amb Ref to Medical Weight Management   Vitamin D deficiency    Ongoing, continue weekly supplement and recheck level today.  Could consider changing to  daily Vitamin D3 if level improving.      Relevant Orders   VITAMIN D 25 Hydroxy (Vit-D Deficiency, Fractures)   Other Visit Diagnoses     Encounter for annual physical exam       Annual physical  today with labs and health maintenance reviewed, discussed with patient.        Follow up plan: Return in about 6 months (around 04/06/2024) for T2DM, HTN/HLD.   LABORATORY TESTING:  - Pap smear: up to date  IMMUNIZATIONS:   - Tdap: Tetanus vaccination status reviewed: last tetanus booster within 10 years. - Influenza: Up to date - Pneumovax: Not applicable - Prevnar: Not applicable - COVID: Refused - HPV: Not applicable - Shingrix vaccine: Not applicable  SCREENING: -Mammogram: Up to date  - Colonoscopy: Up to date  - Bone Density: Not applicable  -Hearing Test: Not applicable  -Spirometry: Not applicable   PATIENT COUNSELING:   Advised to take 1 mg of folate supplement per day if capable of pregnancy.   Sexuality: Discussed sexually transmitted diseases, partner selection, use of condoms, avoidance of unintended pregnancy  and contraceptive alternatives.   Advised to avoid cigarette smoking.  I discussed with the patient that most people either abstain from alcohol or drink within safe limits (<=14/week and <=4 drinks/occasion for males, <=7/weeks and <= 3 drinks/occasion for females) and that the risk for alcohol disorders and other health effects rises proportionally with the number of drinks per week and how often a drinker exceeds daily limits.  Discussed cessation/primary prevention of drug use and availability of treatment for abuse.   Diet: Encouraged to adjust caloric intake to maintain  or achieve ideal body weight, to reduce intake of dietary saturated fat and total fat, to limit sodium intake by avoiding high sodium foods and not adding table salt, and to maintain adequate dietary potassium and calcium preferably from fresh fruits, vegetables, and low-fat dairy  products.    Stressed the importance of regular exercise  Injury prevention: Discussed safety belts, safety helmets, smoke detector, smoking near bedding or upholstery.   Dental health: Discussed importance of regular tooth brushing, flossing, and dental visits.    NEXT PREVENTATIVE PHYSICAL DUE IN 1 YEAR. Return in about 6 months (around 04/06/2024) for T2DM, HTN/HLD.

## 2023-10-07 NOTE — Assessment & Plan Note (Signed)
Stable, continue Bystolic as ordered by Dr. Gollan, cardiology, is tolerating well.  Will not increase at this time, although per cardiology note can consider increase to 5 MG BID or 10 MG nightly if needed in future.  Overall her HR is remaining stable at this time with no symptoms. 

## 2023-10-07 NOTE — Assessment & Plan Note (Signed)
Chronic, ongoing.  Diagnosed in October 2021 -- A1c 5.8% at this time and urine ALB 12 April 2023.  Continue current medication regimen and adjust as needed + continue diet focus.  Continue to monitor BS daily.  Consider trial Ozempic, discussed with her as she is interested in weight loss and this would offer benefit to that.  She will review and let provider know if she wishes to trial.  Would stop Metformin if Ozempic brought on board.

## 2023-10-07 NOTE — Assessment & Plan Note (Signed)
Ongoing, continue weekly supplement and recheck level today.  Could consider changing to daily Vitamin D3 if level improving.

## 2023-10-07 NOTE — Assessment & Plan Note (Signed)
History of lows, but recent was stable.  Noted on past labs and is not taking supplement at present.  Will check level today.

## 2023-10-07 NOTE — Assessment & Plan Note (Addendum)
BMI 61.91 with T2DM, HTN/HLD.  Recommended eating smaller high protein, low fat meals more frequently and exercising 30 mins a day 5 times a week with a goal of 10-15lb weight loss in the next 3 months. Patient voiced their understanding and motivation to adhere to these recommendations. Referral to weight management placed per request, she did not hear from previous referral.

## 2023-10-07 NOTE — Assessment & Plan Note (Addendum)
Chronic, stable.  Continue Rosuvastatin at 2 days a week which she is tolerating.  Lipid panel obtained today.  Refill sent in.

## 2023-10-07 NOTE — Assessment & Plan Note (Signed)
Chronic, stable.  Continue current medication regimen and adjust as needed.  Thyroid labs today. 

## 2023-10-07 NOTE — Assessment & Plan Note (Signed)
Chronic, stable.  BP at goal in office today and well below goal on home readings.  Continue Losartan 50 MG daily and Bystolic 5 MG to take at night. Tolerating medications well, may need to reduce in future if levels get too low.  Will continue collaboration with cardiology. Recommend she monitor BP at least a few mornings a week at home and document.  DASH diet at home. Labs: CBC, CMP, TSH.  Urine ALB 12 April 2023. Refills sent in.

## 2023-10-08 LAB — COMPREHENSIVE METABOLIC PANEL
ALT: 5 [IU]/L (ref 0–32)
AST: 16 [IU]/L (ref 0–40)
Albumin: 3.7 g/dL — ABNORMAL LOW (ref 3.9–4.9)
Alkaline Phosphatase: 94 [IU]/L (ref 44–121)
BUN/Creatinine Ratio: 12 (ref 9–23)
BUN: 7 mg/dL (ref 6–24)
Bilirubin Total: 0.7 mg/dL (ref 0.0–1.2)
CO2: 27 mmol/L (ref 20–29)
Calcium: 9 mg/dL (ref 8.7–10.2)
Chloride: 101 mmol/L (ref 96–106)
Creatinine, Ser: 0.59 mg/dL (ref 0.57–1.00)
Globulin, Total: 2.7 g/dL (ref 1.5–4.5)
Glucose: 95 mg/dL (ref 70–99)
Potassium: 4.1 mmol/L (ref 3.5–5.2)
Sodium: 140 mmol/L (ref 134–144)
Total Protein: 6.4 g/dL (ref 6.0–8.5)
eGFR: 112 mL/min/{1.73_m2} (ref >59)

## 2023-10-08 LAB — TSH: TSH: 2.64 u[IU]/mL (ref 0.450–4.500)

## 2023-10-08 LAB — CBC WITH DIFFERENTIAL/PLATELET
Basophils Absolute: 0.1 10*3/uL (ref 0.0–0.2)
Basos: 1 %
EOS (ABSOLUTE): 0.2 10*3/uL (ref 0.0–0.4)
Eos: 2 %
Hematocrit: 41.1 % (ref 34.0–46.6)
Hemoglobin: 12.9 g/dL (ref 11.1–15.9)
Immature Grans (Abs): 0 10*3/uL (ref 0.0–0.1)
Immature Granulocytes: 0 %
Lymphocytes Absolute: 2.5 10*3/uL (ref 0.7–3.1)
Lymphs: 28 %
MCH: 28.5 pg (ref 26.6–33.0)
MCHC: 31.4 g/dL — ABNORMAL LOW (ref 31.5–35.7)
MCV: 91 fL (ref 79–97)
Monocytes Absolute: 0.7 10*3/uL (ref 0.1–0.9)
Monocytes: 7 %
Neutrophils Absolute: 5.4 10*3/uL (ref 1.4–7.0)
Neutrophils: 62 %
Platelets: 199 10*3/uL (ref 150–450)
RBC: 4.53 x10E6/uL (ref 3.77–5.28)
RDW: 13 % (ref 11.7–15.4)
WBC: 8.9 10*3/uL (ref 3.4–10.8)

## 2023-10-08 LAB — T4, FREE: Free T4: 1.52 ng/dL (ref 0.82–1.77)

## 2023-10-08 LAB — LIPID PANEL W/O CHOL/HDL RATIO
Cholesterol, Total: 140 mg/dL (ref 100–199)
HDL: 45 mg/dL (ref >39)
LDL Chol Calc (NIH): 79 mg/dL (ref 0–99)
Triglycerides: 81 mg/dL (ref 0–149)
VLDL Cholesterol Cal: 16 mg/dL (ref 5–40)

## 2023-10-08 LAB — VITAMIN D 25 HYDROXY (VIT D DEFICIENCY, FRACTURES): Vit D, 25-Hydroxy: 63.7 ng/mL (ref 30.0–100.0)

## 2023-10-08 LAB — VITAMIN B12: Vitamin B-12: 399 pg/mL (ref 232–1245)

## 2023-10-08 NOTE — Progress Notes (Signed)
Contacted via MyChart   Good morning Iyla, your labs have returned: - CBC shows no anemia or infection. - Kidney function, creatinine and eGFR, remains normal, as is liver function, AST and ALT.  - Thyroid levels stable, no changes in medication needed. - Cholesterol levels overall stable, mild trend up in LDL -- ensure you are taking Rosuvastatin as ordered:) - Vitamin D level is stable, continue supplement.  B12 level is on lower side of normal, a supplement 3 days a week of 1000 MCG over the counter B12 would be beneficial.  Any questions? Keep being amazing!!  Thank you for allowing me to participate in your care.  I appreciate you. Kindest regards, Umaiza Matusik

## 2023-10-10 ENCOUNTER — Telehealth: Payer: Self-pay

## 2023-10-10 NOTE — Telephone Encounter (Signed)
PA for Nebivolol initiated and submitted via Cover My Meds. Key: [id number]

## 2023-10-12 NOTE — Telephone Encounter (Signed)
PA approved for medication

## 2023-10-14 ENCOUNTER — Ambulatory Visit: Payer: Managed Care, Other (non HMO) | Attending: Cardiovascular Disease | Admitting: Cardiovascular Disease

## 2023-10-14 ENCOUNTER — Encounter: Payer: Self-pay | Admitting: Cardiovascular Disease

## 2023-10-14 VITALS — BP 110/80 | HR 78 | Ht 62.0 in | Wt 333.0 lb

## 2023-10-14 DIAGNOSIS — I152 Hypertension secondary to endocrine disorders: Secondary | ICD-10-CM

## 2023-10-14 DIAGNOSIS — I10 Essential (primary) hypertension: Secondary | ICD-10-CM

## 2023-10-14 DIAGNOSIS — I479 Paroxysmal tachycardia, unspecified: Secondary | ICD-10-CM

## 2023-10-14 DIAGNOSIS — E1169 Type 2 diabetes mellitus with other specified complication: Secondary | ICD-10-CM

## 2023-10-14 DIAGNOSIS — E1159 Type 2 diabetes mellitus with other circulatory complications: Secondary | ICD-10-CM | POA: Diagnosis not present

## 2023-10-14 DIAGNOSIS — E785 Hyperlipidemia, unspecified: Secondary | ICD-10-CM

## 2023-10-14 DIAGNOSIS — F419 Anxiety disorder, unspecified: Secondary | ICD-10-CM

## 2023-10-14 MED ORDER — NEBIVOLOL HCL 5 MG PO TABS: 5.0000 mg | ORAL_TABLET | Freq: Every day | ORAL | 3 refills | Status: DC

## 2023-10-14 MED ORDER — LOSARTAN POTASSIUM 25 MG PO TABS: 25.0000 mg | ORAL_TABLET | Freq: Every day | ORAL | 3 refills | Status: DC

## 2023-10-14 NOTE — Progress Notes (Signed)
To be good cardiology Office Note  Date:  10/14/2023   ID:  Amy [name], DOB Oct 23, 1976, MRN [id number]  PCP:  Marjie Skiff, NP   Chief Complaint  Patient presents with   12 month follow up     "Doing well." Medications reviewed by the patient verbally.     HPI:  [name] is a 47 year old woman with past medical history of  morbid obesity Hypertension Diabetes Exertional tachycardia Who presents for follow-up of her tachycardia, cardiac risk factors  Last seen in clinic by myself September 2023 Seen by one of our providers March 2023  Starting weight management in [place]  Lots of travel, has been to [place] on a cruise, has been to [place] Plan trip to see [place] in [place]  No regular exercise program Difficulty staying away from carbohydrates  No significant chest pain or shortness of breath on exertion Tachycardia symptoms well-controlled on beta-blocker Difficulty tolerating Crestor daily secondary to myalgias, decreased down to reduced dose 3 days a week  Labs reviewed: Total chol 140,  LDL 79 on crestor 10 mg three days a week A1C 5.8   EKG personally reviewed by myself on todays visit EKG Interpretation Date/Time:  Friday October 14 2023 09:22:17 EDT Ventricular Rate:  78 PR Interval:  216 QRS Duration:  98 QT Interval:  370 QTC Calculation: 421 R Axis:   -9  Text Interpretation: Sinus rhythm with 1st degree A-V block No previous ECGs available Confirmed by Julien Nordmann [id number]) on 10/14/2023 9:30:38 AM    PMH:   has a past medical history of Allergy, Asthma, Diabetes mellitus without complication (HCC), Essential hypertension, Obesity, Psoriasis, Sinus tachycardia, and Thyroid disease (2021).  PSH:    Past Surgical History:  Procedure Laterality Date   CESAREAN SECTION     X 2   COLONOSCOPY WITH PROPOFOL N/A 11/17/2022   Procedure: COLONOSCOPY WITH PROPOFOL;  Surgeon: Toney Reil, MD;  Location: [facility] ENDOSCOPY;  Service:  Gastroenterology;  Laterality: N/A;   HAND SURGERY Right    THUMB   HERNIA REPAIR     right thumb surgery      Current Outpatient Medications  Medication Sig Dispense Refill   levocetirizine (XYZAL) 5 MG tablet Take 1 tablet by mouth once daily 90 tablet 4   levothyroxine (SYNTHROID) 50 MCG tablet Take 1 tablet (50 mcg total) by mouth daily. 90 tablet 4   losartan (COZAAR) 25 MG tablet Take 1 tablet (25 mg total) by mouth daily. 90 tablet 4   meloxicam (MOBIC) 7.5 MG tablet Take 1 tablet (7.5 mg total) by mouth daily. (Patient taking differently: Take 7.5 mg by mouth daily as needed.) 30 tablet 0   metFORMIN (GLUCOPHAGE) 500 MG tablet Take 1 tablet (500 mg total) by mouth at bedtime. 90 tablet 4   methocarbamol (ROBAXIN) 500 MG tablet Take 1 tablet (500 mg total) by mouth every 8 (eight) hours as needed for muscle spasms. 60 tablet 0   Multiple Vitamin (MULTIVITAMINS PO) Take 1 tablet by mouth daily.      nebivolol (BYSTOLIC) 5 MG tablet Take 1 tablet (5 mg total) by mouth daily. May take an extra 5 mg as needed for tachycardia 100 tablet 4   rosuvastatin (CRESTOR) 10 MG tablet Take 1 tablet (10 mg total) by mouth 2 (two) times a week. 24 tablet 4   traMADol (ULTRAM) 50 MG tablet Take 50 mg by mouth 3 (three) times daily as needed.     Vitamin D, Ergocalciferol, (  DRISDOL) 1.25 MG (50000 UNIT) CAPS capsule Take 1 capsule (50,000 Units total) by mouth once a week. 12 capsule 4   No current facility-administered medications for this visit.     Allergies:   Bee venom and Biaxin [clarithromycin]   Social History:  The patient  reports that she quit smoking about 18 years ago. Her smoking use included cigarettes. She started smoking about 23 years ago. She has a 2.5 pack-year smoking history. She has never used smokeless tobacco. She reports that she does not drink alcohol and does not use drugs.   Family History:   family history includes ADD / ADHD in her son; Allergies in her daughter;  Anemia in her mother; Appendicitis in her paternal grandfather; Asthma in her daughter; Autoimmune disease in her mother; Cirrhosis in her father; Congestive Heart Failure in her paternal grandmother; Diabetes in her maternal grandmother; Glaucoma in her father; Hypertension in her maternal grandmother and mother; Osteoporosis in her maternal grandmother; Thyroid disease in her maternal grandmother and mother.   Review of Systems: Review of Systems  Constitutional: Negative.   HENT: Negative.    Respiratory: Negative.    Cardiovascular: Negative.   Gastrointestinal: Negative.   Musculoskeletal: Negative.   Neurological: Negative.   Psychiatric/Behavioral: Negative.    All other systems reviewed and are negative.   PHYSICAL EXAM: VS:  BP 110/80 (BP Location: Left Wrist, Patient Position: Sitting, Cuff Size: Normal)   Pulse 78   Ht 5\' 2"  (1.575 m)   Wt (!) 333 lb (151 kg)   LMP 10/02/2023 (Exact Date)   SpO2 98%   BMI 60.91 kg/m  , BMI Body mass index is 60.91 kg/m. Constitutional:  oriented to person, place, and time. No distress.  HENT:  Head: Grossly normal Eyes:  no discharge. No scleral icterus.  Neck: No JVD, no carotid bruits  Cardiovascular: Regular rate and rhythm, no murmurs appreciated Pulmonary/Chest: Clear to auscultation bilaterally, no wheezes or rails Abdominal: Soft.  no distension.  no tenderness.  Musculoskeletal: Normal range of motion Neurological:  normal muscle tone. Coordination normal. No atrophy Skin: Skin warm and dry Psychiatric: normal affect, pleasant  Recent Labs: 10/07/2023: ALT 5; BUN 7; Creatinine, Ser 0.59; Hemoglobin 12.9; Platelets 199; Potassium 4.1; Sodium 140; TSH 2.640    Lipid Panel Lab Results  Component Value Date   CHOL 140 10/07/2023   HDL 45 10/07/2023   LDLCALC 79 10/07/2023   TRIG 81 10/07/2023      Wt Readings from Last 3 Encounters:  10/14/23 (!) 333 lb (151 kg)  10/07/23 (!) 333 lb (151 kg)  08/09/23 (!) 334 lb  9.6 oz (151.8 kg)     ASSESSMENT AND PLAN:  Problem List Items Addressed This Visit       Cardiology Problems   Hyperlipidemia associated with type 2 diabetes mellitus (HCC)   Hypertension associated with diabetes (HCC)   Relevant Orders   EKG 12-Lead (Completed)   Paroxysmal tachycardia (HCC) - Primary   Relevant Orders   EKG 12-Lead (Completed)     Other   Type 2 diabetes mellitus with morbid obesity (HCC)   Morbid obesity (HCC)   Other Visit Diagnoses     Essential hypertension       Relevant Orders   EKG 12-Lead (Completed)   Anxiety           Essential hypertension Blood pressure is well controlled on today's visit. No changes made to the medications.  Tachycardia Continue  Bystolic 5 mg daily,  Symptoms well-controlled  Morbid obesity Trouble with carbohydrates, scheduled to attend meetings in [place] at weight loss clinic  Anxiety Orts things are stable  Prediabetes A1C relatively well-controlled less than 6  Hyperlipidemia Reports she is able to tolerate Crestor 10 mg 3 days a week Had myalgias on higher dose Crestor daily   Signed, Dossie Arbour, M.D., Ph.D. [facility] [place], [place] [phone]

## 2023-10-14 NOTE — Patient Instructions (Signed)

## 2023-11-22 ENCOUNTER — Ambulatory Visit (INDEPENDENT_AMBULATORY_CARE_PROVIDER_SITE_OTHER): Payer: Managed Care, Other (non HMO) | Admitting: Adult Health

## 2023-11-22 ENCOUNTER — Encounter (INDEPENDENT_AMBULATORY_CARE_PROVIDER_SITE_OTHER): Payer: Self-pay | Admitting: Adult Health

## 2023-11-22 DIAGNOSIS — E1169 Type 2 diabetes mellitus with other specified complication: Secondary | ICD-10-CM

## 2023-11-22 DIAGNOSIS — I152 Hypertension secondary to endocrine disorders: Secondary | ICD-10-CM | POA: Diagnosis not present

## 2023-11-22 DIAGNOSIS — E1159 Type 2 diabetes mellitus with other circulatory complications: Secondary | ICD-10-CM | POA: Diagnosis not present

## 2023-11-22 DIAGNOSIS — Z0289 Encounter for other administrative examinations: Secondary | ICD-10-CM

## 2023-11-22 DIAGNOSIS — Z6841 Body Mass Index (BMI) 40.0 and over, adult: Secondary | ICD-10-CM

## 2023-11-22 DIAGNOSIS — Z7984 Long term (current) use of oral hypoglycemic drugs: Secondary | ICD-10-CM

## 2023-11-22 NOTE — Progress Notes (Signed)
Office: [phone]  /  Fax: [phone]   Initial Visit  Amy [name] was seen in clinic today to evaluate for obesity. She is interested in losing weight to improve overall health and reduce the risk of weight related complications. She presents today to review program treatment options, initial physical assessment, and evaluation.     She was referred by: Self-Referral  When asked what else they would like to accomplish? She states: Adopt healthier eating patterns, Improve energy levels and physical activity, Improve existing medical conditions, Improve quality of life, and fit better in seats during travel.  Weight history: She reports weight is an issue since puberty.  When asked how has your weight affected you? She states: Contributed to medical problems, Contributed to orthopedic problems or mobility issues, Having fatigue, and Having poor endurance  Some associated conditions: Hypertension, Hyperlipidemia, Prediabetes, and Vitamin D Deficiency  Contributing factors: Family history of obesity, Reduced physical activity, and Eating patterns  Weight promoting medications identified: Beta-blockers  Current nutrition plan: None  Current level of physical activity: None  Current or previous pharmacotherapy: Phentermine ??? She is unsure of name of Rx  Response to medication: Ineffective so it was discontinued   Past medical history includes:   Past Medical History:  Diagnosis Date   Allergy    Asthma    Diabetes mellitus without complication (HCC)    Essential hypertension    Obesity    Psoriasis    Sinus tachycardia    Thyroid disease 2021     Objective:   Ht 5\' 2"  (1.575 m)   LMP  (LMP Unknown)   BMI 60.91 kg/m  She was weighed on the bioimpedance scale: Body mass index is 60.91 kg/m.  Peak Weight: 338 , Body Fat%:60.2, Visceral Fat Rating:27, Weight trend over the last 12 months: Increasing/Decreasing  General:  Alert, oriented and cooperative.  Patient is in no acute distress.  Respiratory: Normal respiratory effort, no problems with respiration noted   Gait: able to ambulate independently  Mental Status: Normal mood and affect. Normal behavior. Normal judgment and thought content.   DIAGNOSTIC DATA REVIEWED:  BMET    Component Value Date/Time   NA 140 10/07/2023 0830   K 4.1 10/07/2023 0830   CL 101 10/07/2023 0830   CO2 27 10/07/2023 0830   GLUCOSE 95 10/07/2023 0830   BUN 7 10/07/2023 0830   CREATININE 0.59 10/07/2023 0830   CALCIUM 9.0 10/07/2023 0830   GFRNONAA 109 12/23/2020 0902   GFRAA 126 12/23/2020 0902   Lab Results  Component Value Date   HGBA1C 5.8 (H) 10/07/2023   HGBA1C 6.0 (H) 09/05/2017   No results found for: "INSULIN" CBC    Component Value Date/Time   WBC 8.9 10/07/2023 0830   RBC 4.53 10/07/2023 0830   HGB 12.9 10/07/2023 0830   HCT 41.1 10/07/2023 0830   PLT 199 10/07/2023 0830   MCV 91 10/07/2023 0830   MCH 28.5 10/07/2023 0830   MCHC 31.4 (L) 10/07/2023 0830   RDW 13.0 10/07/2023 0830   Iron/TIBC/Ferritin/ %Sat    Component Value Date/Time   IRON 81 09/05/2017 0850   Lipid Panel     Component Value Date/Time   CHOL 140 10/07/2023 0830   TRIG 81 10/07/2023 0830   HDL 45 10/07/2023 0830   CHOLHDL 3.5 09/05/2017 0850   LDLCALC 79 10/07/2023 0830   Hepatic Function Panel     Component Value Date/Time   PROT 6.4 10/07/2023 0830   ALBUMIN 3.7 (L)  10/07/2023 0830   AST 16 10/07/2023 0830   ALT 5 10/07/2023 0830   ALKPHOS 94 10/07/2023 0830   BILITOT 0.7 10/07/2023 0830      Component Value Date/Time   TSH 2.640 10/07/2023 0830     Assessment and Plan:   Type 2 diabetes mellitus with morbid obesity (HCC)  Hypertension associated with diabetes (HCC)  Morbid obesity (HCC)  ESTABLISH WITH HWW   Obesity Treatment / Action Plan:  Patient will work on garnering support from family and friends to begin weight loss journey. Will work on eliminating or reducing the  presence of highly palatable, calorie dense foods in the home. Will complete provided nutritional and psychosocial assessment questionnaire before the next appointment. Will be scheduled for indirect calorimetry to determine resting energy expenditure in a fasting state.  This will allow [place] to create a reduced calorie, high-protein meal plan to promote loss of fat mass while preserving muscle mass. Counseled on the health benefits of losing 5%-15% of total body weight. Was counseled on nutritional approaches to weight loss and benefits of reducing processed foods and consuming plant-based foods and high quality protein as part of nutritional weight management. Was counseled on pharmacotherapy and role as an adjunct in weight management.   Obesity Education Performed Today:  She was weighed on the bioimpedance scale and results were discussed and documented in the synopsis.  We discussed obesity as a disease and the importance of a more detailed evaluation of all the factors contributing to the disease.  We discussed the importance of long term lifestyle changes which include nutrition, exercise and behavioral modifications as well as the importance of customizing this to her specific health and social needs.  We discussed the benefits of reaching a healthier weight to alleviate the symptoms of existing conditions and reduce the risks of the biomechanical, metabolic and psychological effects of obesity.  Amy [name] appears to be in the action stage of change and states they are ready to start intensive lifestyle modifications and behavioral modifications.  30 minutes was spent today on this visit including the above counseling, pre-visit chart review, and post-visit documentation.  Reviewed by clinician on day of visit: allergies, medications, problem list, medical history, surgical history, family history, social history, and previous encounter notes pertinent to obesity  diagnosis.   Amy [name] d. Merissa Renwick, NP-C

## 2023-12-22 ENCOUNTER — Ambulatory Visit (INDEPENDENT_AMBULATORY_CARE_PROVIDER_SITE_OTHER): Payer: Managed Care, Other (non HMO) | Admitting: Physician Assistant

## 2023-12-28 ENCOUNTER — Encounter (INDEPENDENT_AMBULATORY_CARE_PROVIDER_SITE_OTHER): Payer: Self-pay | Admitting: Internal Medicine

## 2023-12-28 ENCOUNTER — Ambulatory Visit (INDEPENDENT_AMBULATORY_CARE_PROVIDER_SITE_OTHER): Payer: Managed Care, Other (non HMO) | Admitting: Internal Medicine

## 2023-12-28 VITALS — BP 124/83 | HR 70 | Temp 98.0°F | Ht 62.0 in | Wt 332.0 lb

## 2023-12-28 DIAGNOSIS — Z1331 Encounter for screening for depression: Secondary | ICD-10-CM | POA: Diagnosis not present

## 2023-12-28 DIAGNOSIS — R0602 Shortness of breath: Secondary | ICD-10-CM | POA: Diagnosis not present

## 2023-12-28 DIAGNOSIS — I152 Hypertension secondary to endocrine disorders: Secondary | ICD-10-CM | POA: Diagnosis not present

## 2023-12-28 DIAGNOSIS — E785 Hyperlipidemia, unspecified: Secondary | ICD-10-CM

## 2023-12-28 DIAGNOSIS — R5383 Other fatigue: Secondary | ICD-10-CM

## 2023-12-28 DIAGNOSIS — E1169 Type 2 diabetes mellitus with other specified complication: Secondary | ICD-10-CM

## 2023-12-28 DIAGNOSIS — E66813 Obesity, class 3: Secondary | ICD-10-CM

## 2023-12-28 DIAGNOSIS — E1159 Type 2 diabetes mellitus with other circulatory complications: Secondary | ICD-10-CM

## 2023-12-28 DIAGNOSIS — Z6841 Body Mass Index (BMI) 40.0 and over, adult: Secondary | ICD-10-CM

## 2023-12-28 DIAGNOSIS — Z7984 Long term (current) use of oral hypoglycemic drugs: Secondary | ICD-10-CM

## 2023-12-28 NOTE — Assessment & Plan Note (Signed)
 HgbA1c is at goal for age and comorbid conditions. Denies symptoms of hypoglycemia or hyperglycemia. On metformin  500 mg once daily with good adherence and no side effects.   Counseled on goals of care, monitoring for complications and importance of staying updated on immunizations and diabetes preventive measures. Continue with reduced calorie meal plan low on processed crabs and simple sugars. Ongoing weight loss will improve insulin  resistance and glycemic control  Lab Results  Component Value Date   HGBA1C 5.8 (H) 10/07/2023   HGBA1C 6.3 (H) 04/06/2023   HGBA1C 5.9 (H) 10/04/2022   Lab Results  Component Value Date   MICROALBUR 30 (H) 04/06/2023   LDLCALC 79 10/07/2023   CREATININE 0.59 10/07/2023   She will continue metformin .  I feel that patient may also be a good candidate for GLP-1 therapy to assist with the management of her obesity and comorbidities.  Patient has also been educated on the carb insulin  model obesity and is aware of the importance of maintaining a diet with a low glycemic load.

## 2023-12-28 NOTE — Assessment & Plan Note (Signed)
 LDL is close to goal. Elevated LDL may be secondary to nutrition, genetics and spillover effect from excess adiposity. Recommended LDL goal is <70 to reduce the risk of fatty streaks and the progression to obstructive ASCVD in the future.   She is currently on rosuvastatin   Her 10 year risk is: The 10-year ASCVD risk score (Arnett DK, et al., 2019) is: 1.8%  Lab Results  Component Value Date   CHOL 140 10/07/2023   HDL 45 10/07/2023   LDLCALC 79 10/07/2023   TRIG 81 10/07/2023   CHOLHDL 3.5 09/05/2017    Continue weight loss therapy, losing 10% or more of body weight may improve condition. Also advised to reduce saturated fats in diet to less than 10% of daily calories.  She may benefit from further reductions in LDL cholesterol to less than 70.

## 2023-12-28 NOTE — Assessment & Plan Note (Signed)
 See obesity treatment plan

## 2023-12-28 NOTE — Progress Notes (Signed)
 Office: [phone]  /  Fax: [phone]   Subjective   Initial Visit  Amy [name] (MR# [id number]) is an 48 y.o. female who presents for evaluation and treatment of obesity and related comorbidities. Current BMI is Body mass index is 60.72 kg/m. Amy [name] has been struggling with her weight for many years and has been unsuccessful in either losing weight, maintaining weight loss, or reaching her healthy weight goal.  Amy [name] is currently in the action stage of change and ready to dedicate time achieving and maintaining a healthier weight. Amy [name] is interested in becoming our patient and working on intensive lifestyle modifications including (but not limited to) diet and exercise for weight loss.  When asked how their weight has affected their life and health, she states: Contributed to medical problems, Contributed to orthopedic problems or mobility issues, Having fatigue, and Having poor endurance  When asked what else they would like to accomplish? She states: Adopt healthier eating patterns, Improve energy levels and physical activity, Improve existing medical conditions, Reduce number of medications, and Improve quality of life  Weight history:  She starting to note weight gain during : teens and adulthood.  Life events associated with weight gain include : starting a medication depo several years ago increased weight and was not able to loose. .   Other contributing factors: Family history of obesity, Consumption of processed foods, Moderate to high levels of stress, Reduced physical activity, and Slow metabolism for age.  Their highest weight has been:  345 lbs.  Desired weight: 300  Previous weight-loss programs : None.  Their maximum weight loss was:  NA lbs.  Their greatest challenge with dieting: never tried dieting before.  Weight promoting medications identified: Contraceptives or hormonal therapy in past  Current or previous pharmacotherapy:  Phentermine.  Response to medication: Ineffective so it was discontinued  Nutritional History:  Current nutrition plan: None.  How many times do you eat outside the home: 2-4 per week  How often do they eat breakfast : none, they skip breakfast. Skips Lunch sometimes  Number of times they eat per day: 2  What beverages do they drink: caffeinated beverages.   Use of artificial sweetners : Yes  Food intolerances or dislikes: seafood.  Food triggers: Boredom and Help stay awake.  Food cravings: Sugary and Salty  Do they struggle with excessive hunger or portion control : No   Current level of physical activity: None  Past medical history includes:   Past Medical History:  Diagnosis Date   Allergy     Asthma    Back pain    Diabetes mellitus without complication (HCC)    Essential hypertension    Heart burn    Hypothyroidism    Joint pain    Obesity    Prediabetes    Psoriasis    Sinus tachycardia    Thyroid disease 2021   Vitamin B 12 deficiency    Vitamin D  deficiency      Objective   BP 124/83   Pulse 70   Temp 98 F (36.7 C)   Ht 5\' 2"  (1.575 m)   Wt (!) 332 lb (150.6 kg)   LMP 11/04/2023   SpO2 98%   BMI 60.72 kg/m  She was weighed on the bioimpedance scale: Body mass index is 60.72 kg/m.    Anthropometrics:  Vitals Temp: 98 F (36.7 C) BP: 124/83 Pulse Rate: 70 SpO2: 98 %   Anthropometric Measurements Height: 5\' 2"  (1.575 m) Weight: (!) 332 lb (150.6 kg)  BMI (Calculated): 60.71 Starting Weight: 332 lb Peak Weight: 338 lb Waist Measurement : 62 inches   Body Composition  Body Fat %: 59.4 % Fat Mass (lbs): 197.6 lbs Muscle Mass (lbs): 128.2 lbs Total Body Water (lbs): 101.2 lbs Visceral Fat Rating : 26   Other Clinical Data RMR: [id number] Fasting: Yes Labs: Yes Today's Visit #: 1 Starting Date: 12/27/22    Physical Exam:  General: She is overweight, cooperative, alert, well developed, and in no acute distress. PSYCH: Has  normal mood, affect and thought process.   HEENT: EOMI, sclerae are anicteric. Lungs: Normal breathing effort, no conversational dyspnea. Extremities: No edema.  Neurologic: No gross sensory or motor deficits. No tremors or fasciculations noted.    Diagnostic Data Reviewed  EKG: Normal sinus rhythm, rate 78. No conduction abnormalities, abnormal Q waves or chamber enlargement.  Indirect Calorimeter completed today shows a VO2 of 258 and a REE of 1786.  Her calculated basal metabolic rate is 0981 thus her resting energy expenditure slower than calculated.  Depression Screen  Lester's PHQ-9 score was: 5.     10/07/2023    8:24 AM  Depression screen PHQ 2/9  Decreased Interest 0  Down, Depressed, Hopeless 0  PHQ - 2 Score 0  Altered sleeping 0  Tired, decreased energy 0  Change in appetite 0  Feeling bad or failure about yourself  0  Trouble concentrating 0  Moving slowly or fidgety/restless 0  Suicidal thoughts 0  PHQ-9 Score 0  Difficult doing work/chores Not difficult at all    Screening for Sleep Related Breathing Disorders  Sharonann admits to daytime somnolence and admits to waking up still tired. Patient has a history of symptoms of daytime fatigue. Amy [name] generally gets 6 or 7 hours of sleep per night, and states that she does not sleep well most nights. Snoring is present. Apneic episodes are not present. Epworth Sleepiness Score is 3.   BMET    Component Value Date/Time   NA 140 10/07/2023 0830   K 4.1 10/07/2023 0830   CL 101 10/07/2023 0830   CO2 27 10/07/2023 0830   GLUCOSE 95 10/07/2023 0830   BUN 7 10/07/2023 0830   CREATININE 0.59 10/07/2023 0830   CALCIUM  9.0 10/07/2023 0830   GFRNONAA 109 12/23/2020 0902   GFRAA 126 12/23/2020 0902   Lab Results  Component Value Date   HGBA1C 5.8 (H) 10/07/2023   HGBA1C 6.0 (H) 09/05/2017   No results found for: "INSULIN " CBC    Component Value Date/Time   WBC 8.9 10/07/2023 0830   RBC 4.53 10/07/2023 0830   HGB  12.9 10/07/2023 0830   HCT 41.1 10/07/2023 0830   PLT 199 10/07/2023 0830   MCV 91 10/07/2023 0830   MCH 28.5 10/07/2023 0830   MCHC 31.4 (L) 10/07/2023 0830   RDW 13.0 10/07/2023 0830   Iron/TIBC/Ferritin/ %Sat    Component Value Date/Time   IRON 81 09/05/2017 0850   Lipid Panel     Component Value Date/Time   CHOL 140 10/07/2023 0830   TRIG 81 10/07/2023 0830   HDL 45 10/07/2023 0830   CHOLHDL 3.5 09/05/2017 0850   LDLCALC 79 10/07/2023 0830   Hepatic Function Panel     Component Value Date/Time   PROT 6.4 10/07/2023 0830   ALBUMIN 3.7 (L) 10/07/2023 0830   AST 16 10/07/2023 0830   ALT 5 10/07/2023 0830   ALKPHOS 94 10/07/2023 0830   BILITOT 0.7 10/07/2023 0830      Component  Value Date/Time   TSH 2.640 10/07/2023 0830     Assessment and Plan   TREATMENT PLAN FOR OBESITY:  Recommended Dietary Goals  Christina is currently in the action stage of change. As such, her goal is to implement medically supervised weight loss plan.  She has agreed to implement: the Category 2 plan - 1200 kcal per day and +100 cal for a total of 1300 kcal/day  Behavioral Intervention  We discussed the following Behavioral Modification Strategies today: increasing lean protein intake to established goals, decreasing simple carbohydrates , increasing vegetables, increasing lower glycemic fruits, increasing fiber rich foods, avoiding skipping meals, increasing water intake, work on meal planning and preparation, reading food labels , keeping healthy foods at home, identifying sources and decreasing liquid calories, decreasing eating out or consumption of processed foods, and making healthy choices when eating convenient foods, planning for success, and better snacking choices  Additional resources provided today:  Category 2 packet  Recommended Physical Activity Goals  Neri has been advised to work up to 150 minutes of moderate intensity aerobic activity a week and strengthening exercises  2-3 times per week for cardiovascular health, weight loss maintenance and preservation of muscle mass.   She has agreed to :  Think about enjoyable ways to increase daily physical activity and overcoming barriers to exercise and Increase physical activity in their day and reduce sedentary time (increase NEAT).  Pharmacotherapy We will work on building a Therapist, art and behavioral strategies. We will discuss the role of pharmacotherapy as an adjunct at subsequent visits.   ASSOCIATED CONDITIONS ADDRESSED TODAY  Other Fatigue Dimond admits to daytime somnolence and admits to waking up still tired. Patient has a history of symptoms of daytime fatigue. Wynter generally gets 6 or 7 hours of sleep per night, and states that she does not sleep well most nights. Snoring is present. Apneic episodes are not present. Epworth Sleepiness Score is 3. . Necole does feel that her weight is causing her energy to be lower than it should be. Fatigue may be related to obesity, depression or many other causes. Labs will be ordered, and in the meanwhile, Jaime will focus on self care including making healthy food choices, increasing physical activity and focusing on stress reduction.  Shortness of Breath Wiletta notes increasing shortness of breath with exercising and seems to be worsening over time with weight gain. She notes getting out of breath sooner with activity than she used to. This has not gotten worse recently. Caeli denies shortness of breath at rest or orthopnea.Sheniah notes increasing shortness of breath with exercising and seems to be worsening over time with weight gain. She notes getting out of breath sooner with activity than she used to. This has not gotten worse recently. Earnie denies shortness of breath at rest or orthopnea.  Other fatigue -     EKG 12-Lead  SOB (shortness of breath) on exertion  Depression screen  Type 2 diabetes mellitus with morbid obesity (HCC) Assessment  & Plan: HgbA1c is at goal for age and comorbid conditions. Denies symptoms of hypoglycemia or hyperglycemia. On metformin  500 mg once daily with good adherence and no side effects.   Counseled on goals of care, monitoring for complications and importance of staying updated on immunizations and diabetes preventive measures. Continue with reduced calorie meal plan low on processed crabs and simple sugars. Ongoing weight loss will improve insulin  resistance and glycemic control  Lab Results  Component Value Date   HGBA1C 5.8 (H) 10/07/2023  HGBA1C 6.3 (H) 04/06/2023   HGBA1C 5.9 (H) 10/04/2022   Lab Results  Component Value Date   MICROALBUR 30 (H) 04/06/2023   LDLCALC 79 10/07/2023   CREATININE 0.59 10/07/2023   She will continue metformin .  I feel that patient may also be a good candidate for GLP-1 therapy to assist with the management of her obesity and comorbidities.  Patient has also been educated on the carb insulin  model obesity and is aware of the importance of maintaining a diet with a low glycemic load.   Orders: -     Insulin , random  Class 3 severe obesity with serious comorbidity and body mass index (BMI) of 60.0 to 69.9 in adult, unspecified obesity type [facility]) Assessment & Plan: See obesity treatment plan   Hypertension associated with diabetes [facility]) Assessment & Plan: Blood pressure at goal for age and risk category.  On on losartan  and Bystolic  without adverse effects.  Most recent renal parameters reviewed which showed normal electrolytes and kidney function.  Continue with weight loss therapy. Losing 10% may improve blood pressure control. Monitor for symptoms of orthostasis while losing weight. Continue current regimen and home monitoring for a goal blood pressure of 120/80.     Hyperlipidemia associated with type 2 diabetes mellitus (HCC) Assessment & Plan: LDL is close to goal. Elevated LDL may be secondary to nutrition, genetics and spillover effect from  excess adiposity. Recommended LDL goal is <70 to reduce the risk of fatty streaks and the progression to obstructive ASCVD in the future.   She is currently on rosuvastatin   Her 10 year risk is: The 10-year ASCVD risk score (Arnett DK, et al., 2019) is: 1.8%  Lab Results  Component Value Date   CHOL 140 10/07/2023   HDL 45 10/07/2023   LDLCALC 79 10/07/2023   TRIG 81 10/07/2023   CHOLHDL 3.5 09/05/2017    Continue weight loss therapy, losing 10% or more of body weight may improve condition. Also advised to reduce saturated fats in diet to less than 10% of daily calories.  She may benefit from further reductions in LDL cholesterol to less than 70.        Follow-up  She was informed of the importance of frequent follow-up visits to maximize her success with intensive lifestyle modifications for her multiple health conditions. She was informed we would discuss her lab results at her next visit unless there is a critical issue that needs to be addressed sooner. Senia agreed to keep her next visit at the agreed upon time to discuss these results.  Attestation Statement  This is the patient's intake visit at [facility]. The patient's Health Questionnaire was reviewed at length. Included in the packet: current and past health history, medications, allergies, ROS, gynecologic history (women only), surgical history, family history, social history, weight history, weight loss surgery history (for those that have had weight loss surgery), nutritional evaluation, mood and food questionnaire, PHQ9, Epworth questionnaire, sleep habits questionnaire, patient life and health improvement goals questionnaire. These will all be scanned into the patient's chart under media.   During the visit, I independently reviewed the patient's, previous labs, bioimpedance scale results, and indirect calorimetry results. I used this information to medically tailor a meal plan for the patient that will  help her to lose weight and will improve her obesity-related conditions. I performed a medically necessary appropriate examination and/or evaluation. I discussed the assessment and treatment plan with the patient. The patient was provided an opportunity to ask  questions and all were answered. The patient agreed with the plan and demonstrated an understanding of the instructions. Labs were ordered at this visit and will be reviewed at the next visit unless critical results need to be addressed immediately. Clinical information was updated and documented in the EMR.   In addition, they received basic education on identification of processed foods and reduction of these, different sources of lean proteins and complex carbohydrates and how to eat balanced by incorporation of whole foods.  Reviewed by clinician on day of visit: allergies, medications, problem list, medical history, surgical history, family history, social history, and previous encounter notes.  I have spent 60 minutes in the care of the patient today including: preparing to see patient (e.g. review and interpretation of tests, old notes ), obtaining and/or reviewing separately obtained history, performing a medically appropriate examination or evaluation, counseling and educating the patient, ordering medications, test or procedures, documenting clinical information in the electronic or other health care record, and independently interpreting results and communicating results to the patient, family, or caregiver      Ladd Picker, MD

## 2023-12-28 NOTE — Assessment & Plan Note (Signed)
 Blood pressure at goal for age and risk category.  On on losartan and Bystolic without adverse effects.  Most recent renal parameters reviewed which showed normal electrolytes and kidney function.  Continue with weight loss therapy. Losing 10% may improve blood pressure control. Monitor for symptoms of orthostasis while losing weight. Continue current regimen and home monitoring for a goal blood pressure of 120/80.

## 2023-12-29 LAB — INSULIN, RANDOM: INSULIN: 24.9 u[IU]/mL (ref 2.6–24.9)

## 2024-01-11 ENCOUNTER — Ambulatory Visit (INDEPENDENT_AMBULATORY_CARE_PROVIDER_SITE_OTHER): Payer: Managed Care, Other (non HMO) | Admitting: Internal Medicine

## 2024-01-11 ENCOUNTER — Encounter (INDEPENDENT_AMBULATORY_CARE_PROVIDER_SITE_OTHER): Payer: Self-pay | Admitting: Internal Medicine

## 2024-01-11 VITALS — BP 121/74 | HR 74 | Temp 98.2°F | Ht 62.0 in | Wt 329.0 lb

## 2024-01-11 DIAGNOSIS — E1159 Type 2 diabetes mellitus with other circulatory complications: Secondary | ICD-10-CM

## 2024-01-11 DIAGNOSIS — E1169 Type 2 diabetes mellitus with other specified complication: Secondary | ICD-10-CM

## 2024-01-11 DIAGNOSIS — Z6841 Body Mass Index (BMI) 40.0 and over, adult: Secondary | ICD-10-CM

## 2024-01-11 DIAGNOSIS — E785 Hyperlipidemia, unspecified: Secondary | ICD-10-CM

## 2024-01-11 DIAGNOSIS — Z7984 Long term (current) use of oral hypoglycemic drugs: Secondary | ICD-10-CM

## 2024-01-11 DIAGNOSIS — E66813 Obesity, class 3: Secondary | ICD-10-CM

## 2024-01-11 DIAGNOSIS — I152 Hypertension secondary to endocrine disorders: Secondary | ICD-10-CM

## 2024-01-11 NOTE — Progress Notes (Signed)
Office: [phone]  /  Fax: [phone]  Weight Summary And Biometrics  Vitals Temp: 98.2 F (36.8 C) BP: 121/74 Pulse Rate: 74 SpO2: 97 %   Anthropometric Measurements Height: 5\' 2"  (1.575 m) Weight: (!) 329 lb (149.2 kg) BMI (Calculated): 60.16 Weight at Last Visit: 332 lb Weight Lost Since Last Visit: 3 lb Weight Gained Since Last Visit: 0 lb Starting Weight: 332 lb Total Weight Loss (lbs): 3 lb (1.361 kg) Peak Weight: 338 lb   Body Composition  Body Fat %: 60.2 % Fat Mass (lbs): 198.2 lbs Muscle Mass (lbs): 124.6 lbs Visceral Fat Rating : 26   The 10-year ASCVD risk score (Arnett DK, et al., 2019) is: 1.7%  RMR: 1786  Today's Visit #: 2  Starting Date: 12/27/22   Subjective   Chief Complaint: Obesity  Amy [name] is here to discuss her progress with her obesity treatment plan. She is on the the Category 2 Plan and states she is following her eating plan approximately 85 % of the time. She states she is not exercising.  Weight Progress Since Last Visit:  Discussed the use of AI scribe software for clinical note transcription with the patient, who gave verbal consent to proceed.  History of Present Illness   The patient, with obesity, type 2 diabetes, hypertension, and hyperlipidemia, presents for medical weight management.  She is attending her second visit for medical weight management and has lost three pounds since her last visit by adhering to a reduced-calorie nutrition plan 85% of the time. She finds it challenging to maintain a diet under 1300 calories but feels satisfied between meals. She craves sweet and salty foods but manages these cravings by consuming fruit, particularly strawberries, as she dislikes apples due to acid reflux.  She is mindful of her sodium intake, aiming to stay under 2000 mg per day. She avoids canned fruits due to their sweetness and texture, preferring fresh or frozen options. She is conscious of her protein intake,  including eggs, beans, and seafood in her diet, as she does not enjoy eating meat.  She is not currently engaging in regular exercise, citing time constraints and a general dislike for exercise as barriers. She mentions having a treadmill at home that is not in use. She is open to walking but has not incorporated it into her routine. She acknowledges being active throughout the day but does not engage in structured physical activity.  Her diabetes is well-controlled with a daily blood sugar around 100 mg/dL. She takes metformin twice daily. There are no significant changes in energy levels, sleep, or bowel movements.  She manages her hypertension by monitoring sodium intake and avoiding high-sodium foods. Her LDL cholesterol is at 79, which is within the target range. She takes rosuvastatin twice a week due to side effects when taken daily.        Orexigenic Control: Denies problems with appetite and hunger signals.  Denies problems with satiety and satiation.  Denies problems with eating patterns and portion control.  Denies abnormal cravings. Denies feeling deprived or restricted.   Pharmacotherapy for weight management: She is currently taking Metformin (off label use for incretin effect and / or insulin resistance and / or diabetes prevention) with adequate clinical response  and without side effects..   Assessment and Plan   Treatment Plan For Obesity:  Recommended Dietary Goals  Amy [name] is currently in the action stage of change. As such, her goal is to continue weight management plan. She has agreed to: continue  current plan  Behavioral Health and Counseling  We discussed the following behavioral modification strategies today: continue to work on maintaining a reduced calorie state, getting the recommended amount of protein, incorporating whole foods, making healthy choices, staying well hydrated and practicing mindfulness when eating..  Additional education and resources  provided today: None  Recommended Physical Activity Goals  Amy [name] has been advised to work up to 150 minutes of moderate intensity aerobic activity a week and strengthening exercises 2-3 times per week for cardiovascular health, weight loss maintenance and preservation of muscle mass.   She has agreed to :  Think about enjoyable ways to increase daily physical activity and overcoming barriers to exercise and Increase physical activity in their day and reduce sedentary time (increase NEAT).  Pharmacotherapy  We discussed various medication options to help Amy [name] with her weight loss efforts and we both agreed to : has declined pharmacotherapy and adequate clinical response to current dose, continue current regimen  Associated Conditions Impacted by Obesity Treatment  Hypertension associated with diabetes (HCC)  Hyperlipidemia associated with type 2 diabetes mellitus (HCC)  Type 2 diabetes mellitus with morbid obesity (HCC)  Class 3 severe obesity with serious comorbidity and body mass index (BMI) of 60.0 to 69.9 in adult, unspecified obesity type (HCC)   Assessment and Plan    Obesity Presents for medical weight management. Has lost three pounds since the last visit, following a reduced calorie nutrition plan 85% of the time. Not currently exercising. Discussed the importance of physical activity in addition to dietary changes for weight loss and overall health. Encouraged to incorporate small amounts of exercise, such as walking or using the treadmill at home, and to consider activities she enjoys, like swimming. Discussed the potential benefits of weight loss medications and surgery as additional tools if needed. Patient prefers to try lifestyle changes first before considering medications or surgery.  - Continue reduced calorie nutrition plan (1200 calories/day) - Encourage physical activity, aiming for 150 minutes/week - Provide information on exercise goals and types of exercises  (NEAT, strength training, aerobic) - Discuss potential use of weight loss medications if needed in the future  Type 2 Diabetes Mellitus Diabetes is well controlled with an A1c of 5.8%. However, fasting insulin levels are elevated, indicating insulin resistance. Discussed the importance of reducing carbohydrate intake to manage insulin levels and improve weight loss. Metformin was discussed as a potential aid for weight management and appetite suppression, providing a sense of fullness and cardiovascular benefits. Patient monitors blood sugar levels regularly and reports morning levels around 100. - Continue current diabetes management - Consider increasing metformin to twice daily for weight management and appetite suppression - Monitor blood sugar levels regularly  Hypertension Hypertension is well controlled. Discussed the importance of maintaining a low sodium diet to manage blood pressure. Patient is aware of the need to stay under 2000 mg of sodium per day and to avoid processed foods. - Continue current hypertension management - Maintain low sodium diet (<2000 mg/day) -Reviewed renal parameters are within normal limits.  Hyperlipidemia Cholesterol levels are within target range (LDL 79). Taking rosuvastatin twice a week due to side effects when taken daily. Discussed the importance of continuing to manage cholesterol levels through diet and medication. Encouraged to maintain a low saturated fat diet. - Continue rosuvastatin twice a week - Maintain low saturated fat diet  General Health Maintenance Up to date with cervical cancer screening (Pap smear) and breast cancer screening (mammogram). Does not qualify for lung cancer screening  due to smoking history. Discussed the importance of regular health screenings and maintaining cardiovascular risk management through diabetes, hypertension, and hyperlipidemia control. - Continue regular health screenings as recommended - Maintain  cardiovascular risk management through diabetes, hypertension, and hyperlipidemia control  Follow-up - Schedule follow-up  appointment in three months.        Objective   Physical Exam:  Blood pressure 121/74, pulse 74, temperature 98.2 F (36.8 C), height 5\' 2"  (1.575 m), weight (!) 329 lb (149.2 kg), last menstrual period 11/04/2023, SpO2 97%. Body mass index is 60.17 kg/m.  General: She is overweight, cooperative, alert, well developed, and in no acute distress. PSYCH: Has normal mood, affect and thought process.   HEENT: EOMI, sclerae are anicteric. Lungs: Normal breathing effort, no conversational dyspnea. Extremities: No edema.  Neurologic: No gross sensory or motor deficits. No tremors or fasciculations noted.    Diagnostic Data Reviewed:  BMET    Component Value Date/Time   NA 140 10/07/2023 0830   K 4.1 10/07/2023 0830   CL 101 10/07/2023 0830   CO2 27 10/07/2023 0830   GLUCOSE 95 10/07/2023 0830   BUN 7 10/07/2023 0830   CREATININE 0.59 10/07/2023 0830   CALCIUM 9.0 10/07/2023 0830   GFRNONAA 109 12/23/2020 0902   GFRAA 126 12/23/2020 0902   Lab Results  Component Value Date   HGBA1C 5.8 (H) 10/07/2023   HGBA1C 6.0 (H) 09/05/2017   Lab Results  Component Value Date   INSULIN 24.9 12/28/2023   Lab Results  Component Value Date   TSH 2.640 10/07/2023   CBC    Component Value Date/Time   WBC 8.9 10/07/2023 0830   RBC 4.53 10/07/2023 0830   HGB 12.9 10/07/2023 0830   HCT 41.1 10/07/2023 0830   PLT 199 10/07/2023 0830   MCV 91 10/07/2023 0830   MCH 28.5 10/07/2023 0830   MCHC 31.4 (L) 10/07/2023 0830   RDW 13.0 10/07/2023 0830   Iron Studies    Component Value Date/Time   IRON 81 09/05/2017 0850   Lipid Panel     Component Value Date/Time   CHOL 140 10/07/2023 0830   TRIG 81 10/07/2023 0830   HDL 45 10/07/2023 0830   CHOLHDL 3.5 09/05/2017 0850   LDLCALC 79 10/07/2023 0830   Hepatic Function Panel     Component Value Date/Time    PROT 6.4 10/07/2023 0830   ALBUMIN 3.7 (L) 10/07/2023 0830   AST 16 10/07/2023 0830   ALT 5 10/07/2023 0830   ALKPHOS 94 10/07/2023 0830   BILITOT 0.7 10/07/2023 0830      Component Value Date/Time   TSH 2.640 10/07/2023 0830   Nutritional Lab Results  Component Value Date   VD25OH 63.7 10/07/2023   VD25OH 51.5 10/04/2022   VD25OH 57.3 06/24/2021    Follow-Up   Return in about 3 weeks (around 02/01/2024) for For Weight Mangement with Dr. Rikki Spearing.Marland Kitchen She was informed of the importance of frequent follow up visits to maximize her success with intensive lifestyle modifications for her multiple health conditions.  Attestation Statement   Reviewed by clinician on day of visit: allergies, medications, problem list, medical history, surgical history, family history, social history, and previous encounter notes.   I have spent 40 minutes in the care of the patient today including: preparing to see patient (e.g. review and interpretation of tests, old notes ), obtaining and/or reviewing separately obtained history, performing a medically appropriate examination or evaluation, counseling and educating the patient, documenting clinical information in  the electronic or other health care record, and independently interpreting results and communicating results to the patient, family, or caregiver   Worthy Rancher, MD

## 2024-02-02 ENCOUNTER — Ambulatory Visit (INDEPENDENT_AMBULATORY_CARE_PROVIDER_SITE_OTHER): Payer: Managed Care, Other (non HMO) | Admitting: Internal Medicine

## 2024-02-28 ENCOUNTER — Ambulatory Visit (INDEPENDENT_AMBULATORY_CARE_PROVIDER_SITE_OTHER): Payer: Managed Care, Other (non HMO) | Admitting: Internal Medicine

## 2024-02-28 ENCOUNTER — Encounter (INDEPENDENT_AMBULATORY_CARE_PROVIDER_SITE_OTHER): Payer: Self-pay | Admitting: Internal Medicine

## 2024-02-28 VITALS — BP 128/75 | HR 66 | Temp 97.9°F | Ht 62.0 in | Wt 330.0 lb

## 2024-02-28 DIAGNOSIS — E66813 Obesity, class 3: Secondary | ICD-10-CM

## 2024-02-28 DIAGNOSIS — I152 Hypertension secondary to endocrine disorders: Secondary | ICD-10-CM

## 2024-02-28 DIAGNOSIS — Z6841 Body Mass Index (BMI) 40.0 and over, adult: Secondary | ICD-10-CM

## 2024-02-28 DIAGNOSIS — E1169 Type 2 diabetes mellitus with other specified complication: Secondary | ICD-10-CM | POA: Diagnosis not present

## 2024-02-28 DIAGNOSIS — E1159 Type 2 diabetes mellitus with other circulatory complications: Secondary | ICD-10-CM

## 2024-02-28 DIAGNOSIS — Z7984 Long term (current) use of oral hypoglycemic drugs: Secondary | ICD-10-CM

## 2024-02-28 MED ORDER — METFORMIN HCL 500 MG PO TABS: 500.0000 mg | ORAL_TABLET | Freq: Two times a day (BID) | ORAL | Status: DC

## 2024-02-28 NOTE — Progress Notes (Signed)
 Office: [phone]  /  Fax: [phone]  Weight Summary And Biometrics  Vitals Temp: 97.9 F (36.6 C) BP: 128/75 Pulse Rate: 66 SpO2: 97 %   Anthropometric Measurements Height: 5\' 2"  (1.575 m) Weight: (!) 330 lb (149.7 kg) BMI (Calculated): 60.34 Weight at Last Visit: 329 lb Weight Lost Since Last Visit: 0 Weight Gained Since Last Visit: 1 lb Starting Weight: 332 lb Total Weight Loss (lbs): 2 lb (0.907 kg) Peak Weight: 338 lb   Body Composition  Body Fat %: 60.4 % Fat Mass (lbs): 199.4 lbs Muscle Mass (lbs): 124 lbs Visceral Fat Rating : 26    RMR: 1786  Today's Visit #: 3  Starting Date: 12/28/23   Subjective   Chief Complaint: Obesity  Interval History Discussed the use of AI scribe software for clinical note transcription with the patient, who gave verbal consent to proceed.  History of Present Illness   Amy [name] is a 48 year old female with obesity, type 2 diabetes, hypertension, and hyperlipidemia who presents for medical weight management.  She is relatively new to the medical weight management program and has gained one pound since her last visit. She adheres to her reduced calorie plan 50-60% of the time, consumes fast food twice a week, and is not engaging in physical exercise. She experiences work-related stress and sleeps approximately seven hours per night.  She finds it challenging to maintain a low-carb diet, particularly in finding foods that are both low sodium and low carb. She can eat salads but reports gastrointestinal discomfort with frequent consumption of leafy greens like spinach. She is not currently tracking her calories or using an app for journaling her food intake.  Her current meal plan is a category two plan with a target of 1200 calories. She aims to lower her carbohydrate intake further due to high insulin levels, which are about three times the normal amount. Her blood sugar levels have also been elevated, and she  monitors them daily. Her last A1c was 5.8, down from 6.3, with an initial diagnosis A1c of 6.7.  She is currently taking metformin once daily and is open to adjusting her medication regimen to better manage her diabetes.  She is making dietary changes for her entire family, stating 'if I got to cook it, you're all eating it.'        Challenges affecting patient progress: having difficulty preparing healthy meals, having difficulty with meal prep and planning, having difficulty focusing on healthy eating, difficulty implementing reduced calorie nutrition plan, difficulty maintaining a reduced calorie state, limited food variation or intolerances, exposure to enticing environments and/or relationships, low volume of physical activity at present , and medical comorbidities.    Pharmacotherapy for weight management: She is currently taking Metformin with diabetes as primary indication with adequate clinical response  and without side effects..   Assessment and Plan   Treatment Plan For Obesity:  Recommended Dietary Goals  Amy [name] is currently in the action stage of change. As such, her goal is to continue weight management plan. She has agreed to: continue current plan  Behavioral Health and Counseling  We discussed the following behavioral modification strategies today: increasing lean protein intake to established goals, decreasing simple carbohydrates , increasing vegetables, increasing lower glycemic fruits, increasing fiber rich foods, increasing water intake , work on meal planning and preparation, avoiding temptations and identifying enticing environmental cues, and continue to work on implementation of reduced calorie nutritional plan.  Additional education and resources provided today: Provided  with personal guidance and instructions on how to use [employer] for healthy meal ideas and cooking in bulk.  Recommended Physical Activity Goals  Amy [name] has been advised to work up to 150  minutes of moderate intensity aerobic activity a week and strengthening exercises 2-3 times per week for cardiovascular health, weight loss maintenance and preservation of muscle mass.   She has agreed to :  Think about enjoyable ways to increase daily physical activity and overcoming barriers to exercise and Increase physical activity in their day and reduce sedentary time (increase NEAT).  Pharmacotherapy  We discussed various medication options to help Amy [name] with her weight loss efforts and we both agreed to : increase metformin XR 500 mg twice a day.  She has declined other antiobesity medications.  Associated Conditions Impacted by Obesity Treatment  Hypertension associated with diabetes [facility]) Assessment & Plan: Blood pressure at goal for age and risk category.  On on losartan and Bystolic without adverse effects.  Most recent renal parameters reviewed which showed normal electrolytes and kidney function.  Continue with weight loss therapy. Losing 10% may improve blood pressure control. Monitor for symptoms of orthostasis while losing weight. Continue current regimen and home monitoring for a goal blood pressure of 120/80.     Type 2 diabetes mellitus with morbid obesity (HCC) Assessment & Plan: Her last A1c was 5.8, down from 6.3, but insulin levels remain high at 24, indicating high degree of insulin resistance.  She checks her blood sugar daily, which has been high. Discussed the impact of high insulin levels on weight loss and the importance of dietary management in controlling blood sugar levels. Prefers to manage her condition without medication, but agreed to increase metformin  - Increase metformin to BID with meals.   - Consider GLP-1 therapy  -Continue to work on eating more whole foods.  Orders: -     metFORMIN HCl; Take 1 tablet (500 mg total) by mouth 2 (two) times daily with a meal.  Class 3 severe obesity with serious comorbidity and body mass index (BMI) of 60.0 to  69.9 in adult, unspecified obesity type [facility]) Assessment & Plan: She is relatively new to the medical weight management program and has gained one pound since the last visit. She follows her reduced calorie plan 50-60% of the time, consumes fast food twice a week, and is not exercising. She reports difficulty maintaining a low-carb diet and struggles with finding low sodium and low carb foods that she will eat. Emphasized the importance of balanced eating, reducing processed foods, and incorporating more whole foods into her diet. Encouraged to view certain foods as treats and to limit her frequency and portion size. Discussed the impact of processed foods on digestion and weight retention due to changes in gut bacteria.   - Encourage balanced eating with a focus on whole foods and reducing processed foods.   -Increase consumption of complex carbs versus simple carbs - Consider referral to a dietician for tailored dietary advice.   - Encourage tracking and journaling of dietary intake.   - Discuss the importance of physical activity and overcoming obstacles to exercise.        General Health Maintenance   Discussed the importance of a balanced diet, physical activity, and hydration as part of general health maintenance. Encouraged to incorporate more vegetables and whole foods into her diet and to drink more water.   - Encourage increased intake of vegetables and whole foods.   - Encourage adequate hydration.   -  Discuss the importance of regular physical activity.          Objective   Physical Exam:  Blood pressure 128/75, pulse 66, temperature 97.9 F (36.6 C), height 5\' 2"  (1.575 m), weight (!) 330 lb (149.7 kg), SpO2 97%. Body mass index is 60.36 kg/m.  General: She is overweight, cooperative, alert, well developed, and in no acute distress. PSYCH: Has normal mood, affect and thought process.   HEENT: EOMI, sclerae are anicteric. Lungs: Normal breathing effort, no conversational  dyspnea. Extremities: No edema.  Neurologic: No gross sensory or motor deficits. No tremors or fasciculations noted.    Diagnostic Data Reviewed:  BMET    Component Value Date/Time   NA 140 10/07/2023 0830   K 4.1 10/07/2023 0830   CL 101 10/07/2023 0830   CO2 27 10/07/2023 0830   GLUCOSE 95 10/07/2023 0830   BUN 7 10/07/2023 0830   CREATININE 0.59 10/07/2023 0830   CALCIUM 9.0 10/07/2023 0830   GFRNONAA 109 12/23/2020 0902   GFRAA 126 12/23/2020 0902   Lab Results  Component Value Date   HGBA1C 5.8 (H) 10/07/2023   HGBA1C 6.0 (H) 09/05/2017   Lab Results  Component Value Date   INSULIN 24.9 12/28/2023   Lab Results  Component Value Date   TSH 2.640 10/07/2023   CBC    Component Value Date/Time   WBC 8.9 10/07/2023 0830   RBC 4.53 10/07/2023 0830   HGB 12.9 10/07/2023 0830   HCT 41.1 10/07/2023 0830   PLT 199 10/07/2023 0830   MCV 91 10/07/2023 0830   MCH 28.5 10/07/2023 0830   MCHC 31.4 (L) 10/07/2023 0830   RDW 13.0 10/07/2023 0830   Iron Studies    Component Value Date/Time   IRON 81 09/05/2017 0850   Lipid Panel     Component Value Date/Time   CHOL 140 10/07/2023 0830   TRIG 81 10/07/2023 0830   HDL 45 10/07/2023 0830   CHOLHDL 3.5 09/05/2017 0850   LDLCALC 79 10/07/2023 0830   Hepatic Function Panel     Component Value Date/Time   PROT 6.4 10/07/2023 0830   ALBUMIN 3.7 (L) 10/07/2023 0830   AST 16 10/07/2023 0830   ALT 5 10/07/2023 0830   ALKPHOS 94 10/07/2023 0830   BILITOT 0.7 10/07/2023 0830      Component Value Date/Time   TSH 2.640 10/07/2023 0830   Nutritional Lab Results  Component Value Date   VD25OH 63.7 10/07/2023   VD25OH 51.5 10/04/2022   VD25OH 57.3 06/24/2021    Medications: Outpatient Encounter Medications as of 02/28/2024  Medication Sig   levocetirizine (XYZAL) 5 MG tablet Take 1 tablet by mouth once daily   levothyroxine (SYNTHROID) 50 MCG tablet Take 1 tablet (50 mcg total) by mouth daily.   losartan  (COZAAR) 25 MG tablet Take 1 tablet (25 mg total) by mouth daily.   meloxicam (MOBIC) 7.5 MG tablet Take 1 tablet (7.5 mg total) by mouth daily.   methocarbamol (ROBAXIN) 500 MG tablet Take 1 tablet (500 mg total) by mouth every 8 (eight) hours as needed for muscle spasms.   Multiple Vitamin (MULTIVITAMINS PO) Take 1 tablet by mouth daily.   nebivolol (BYSTOLIC) 5 MG tablet Take 1 tablet (5 mg total) by mouth daily. May take an extra 5 mg as needed for tachycardia   rosuvastatin (CRESTOR) 10 MG tablet Take 1 tablet (10 mg total) by mouth 2 (two) times a week.   traMADol (ULTRAM) 50 MG tablet Take 50 mg  by mouth 3 (three) times daily as needed.   Vitamin D, Ergocalciferol, (DRISDOL) 1.25 MG (50000 UNIT) CAPS capsule Take 1 capsule (50,000 Units total) by mouth once a week.   [DISCONTINUED] metFORMIN (GLUCOPHAGE) 500 MG tablet Take 1 tablet (500 mg total) by mouth at bedtime.   metFORMIN (GLUCOPHAGE) 500 MG tablet Take 1 tablet (500 mg total) by mouth 2 (two) times daily with a meal.   No facility-administered encounter medications on file as of 02/28/2024.     Follow-Up   Return in about 3 weeks (around 03/20/2024) for For Weight Mangement with Dr. Rikki Spearing.Marland Kitchen She was informed of the importance of frequent follow up visits to maximize her success with intensive lifestyle modifications for her multiple health conditions.  Attestation Statement   Reviewed by clinician on day of visit: allergies, medications, problem list, medical history, surgical history, family history, social history, and previous encounter notes.     Worthy Rancher, MD

## 2024-02-28 NOTE — Assessment & Plan Note (Signed)
 She is relatively new to the medical weight management program and has gained one pound since the last visit. She follows her reduced calorie plan 50-60% of the time, consumes fast food twice a week, and is not exercising. She reports difficulty maintaining a low-carb diet and struggles with finding low sodium and low carb foods that she will eat. Emphasized the importance of balanced eating, reducing processed foods, and incorporating more whole foods into her diet. Encouraged to view certain foods as treats and to limit her frequency and portion size. Discussed the impact of processed foods on digestion and weight retention due to changes in gut bacteria.   - Encourage balanced eating with a focus on whole foods and reducing processed foods.   -Increase consumption of complex carbs versus simple carbs - Consider referral to a dietician for tailored dietary advice.   - Encourage tracking and journaling of dietary intake.   - Discuss the importance of physical activity and overcoming obstacles to exercise.

## 2024-02-28 NOTE — Assessment & Plan Note (Signed)
 Her last A1c was 5.8, down from 6.3, but insulin levels remain high at 24, indicating high degree of insulin resistance.  She checks her blood sugar daily, which has been high. Discussed the impact of high insulin levels on weight loss and the importance of dietary management in controlling blood sugar levels. Prefers to manage her condition without medication, but agreed to increase metformin  - Increase metformin to BID with meals.   - Consider GLP-1 therapy  -Continue to work on eating more whole foods.

## 2024-02-28 NOTE — Assessment & Plan Note (Signed)
 Blood pressure at goal for age and risk category.  On on losartan and Bystolic without adverse effects.  Most recent renal parameters reviewed which showed normal electrolytes and kidney function.  Continue with weight loss therapy. Losing 10% may improve blood pressure control. Monitor for symptoms of orthostasis while losing weight. Continue current regimen and home monitoring for a goal blood pressure of 120/80.

## 2024-02-28 NOTE — Assessment & Plan Note (Deleted)
 She is relatively new to the medical weight management program and has gained one pound since the last visit. She follows her reduced calorie plan 50-60% of the time, consumes fast food twice a week, and is not exercising. She reports difficulty maintaining a low-carb diet and struggles with finding low sodium and low carb foods that she will eat. Emphasized the importance of balanced eating, reducing processed foods, and incorporating more whole foods into her diet. Encouraged to view certain foods as treats and to limit her frequency and portion size. Discussed the impact of processed foods on digestion and weight retention due to changes in gut bacteria.   - Encourage balanced eating with a focus on whole foods and reducing processed foods.   -Increase consumption of complex carbs versus simple carbs - Consider referral to a dietician for tailored dietary advice.   - Encourage tracking and journaling of dietary intake.   - Discuss the importance of physical activity and overcoming obstacles to exercise.

## 2024-04-01 NOTE — Patient Instructions (Signed)
Be Involved in Caring For Your Health:  Taking Medications When medications are taken as directed, they can greatly improve your health. But if they are not taken as prescribed, they may not work. In some cases, not taking them correctly can be harmful. To help ensure your treatment remains effective and safe, understand your medications and how to take them. Bring your medications to each visit for review by your provider.  Your lab results, notes, and after visit summary will be available on My Chart. We strongly encourage you to use this feature. If lab results are abnormal the clinic will contact you with the appropriate steps. If the clinic does not contact you assume the results are satisfactory. You can always view your results on My Chart. If you have questions regarding your health or results, please contact the clinic during office hours. You can also ask questions on My Chart.  We at [facility] are grateful that you chose [place] to provide your care. We strive to provide evidence-based and compassionate care and are always looking for feedback. If you get a survey from the clinic please complete this so we can hear your opinions.  Diabetes Mellitus and Exercise Regular exercise is important for your health, especially if you have diabetes mellitus. Exercise is not just about losing weight. It can also help you increase muscle strength and bone density and reduce body fat and stress. This can help your level of endurance and make you more fit and flexible. Why should I exercise if I have diabetes? Exercise has many benefits for people with diabetes. It can: Help lower and control your blood sugar (glucose). Help your body respond better and become more sensitive to the hormone insulin. Reduce how much insulin your body needs. Lower your risk for heart disease by: Lowering how much "bad" cholesterol and triglycerides you have in your body. Increasing how much "good" cholesterol  you have in your body. Lowering your blood pressure. Lowering your blood glucose levels. What is my activity plan? Your health care provider or an expert trained in diabetes care (certified diabetes educator) can help you make an activity plan. This plan can help you find the type of exercise that works for you. It may also tell you how often to exercise and for how long. Be sure to: Get at least 150 minutes of medium-intensity or high-intensity exercise each week. This may involve brisk walking, biking, or water aerobics. Do stretching and strengthening exercises at least 2 times a week. This may involve yoga or weight lifting. Spread out your activity over at least 3 days of the week. Get some form of physical activity each day. Do not go more than 2 days in a row without some kind of activity. Avoid being inactive for more than 30 minutes at a time. Take frequent breaks to walk or stretch. Choose activities that you enjoy. Set goals that you know you can accomplish. Start slowly and increase the intensity of your exercise over time. How do I manage my diabetes during exercise?  Monitor your blood glucose Check your blood glucose before and after you exercise. If your blood glucose is 240 mg/dL (40.9 mmol/L) or higher before you exercise, check your urine for ketones. These are chemicals created by the liver. If you have ketones in your urine, do not exercise until your blood glucose returns to normal. If your blood glucose is 100 mg/dL (5.6 mmol/L) or lower, eat a snack that has 15-20 grams of carbohydrate in  it. Check your blood glucose 15 minutes after the snack to make sure that your level is above 100 mg/dL (5.6 mmol/L) before you start to exercise. Your risk for low blood glucose (hypoglycemia) goes up during and after exercise. Know the symptoms of this condition and how to treat it. Follow these instructions at home: Keep a carbohydrate snack on hand for use before, during, and after  exercise. This can help prevent or treat hypoglycemia. Avoid injecting insulin into parts of your body that are going to be used during exercise. This may include: Your arms, when you are going to play tennis. Your legs, when you are about to go jogging. Keep track of your exercise habits. This can help you and your health care provider watch and adjust your activity plan. Write down: What you eat before and after you exercise. Blood glucose levels before and after you exercise. The type and amount of exercise you do. Talk to your health care provider before you start a new activity. They may need to: Make sure that the activity is safe for you. Adjust your insulin, other medicines, and food that you eat. Drink water while you exercise. This can stop you from losing too much water (dehydration). It can also prevent problems caused by having a lot of heat in your body (heat stroke). Where to find more information American Diabetes Association: diabetes.org Association of Diabetes Care & Education Specialists: diabeteseducator.org This information is not intended to replace advice given to you by your health care provider. Make sure you discuss any questions you have with your health care provider. Document Revised: 05/19/2022 Document Reviewed: 05/19/2022 Elsevier Patient Education  2024 [employer].

## 2024-04-03 ENCOUNTER — Encounter (INDEPENDENT_AMBULATORY_CARE_PROVIDER_SITE_OTHER): Payer: Self-pay | Admitting: Internal Medicine

## 2024-04-03 ENCOUNTER — Ambulatory Visit (INDEPENDENT_AMBULATORY_CARE_PROVIDER_SITE_OTHER): Admitting: Internal Medicine

## 2024-04-03 VITALS — BP 121/77 | HR 61 | Temp 98.0°F | Ht 62.0 in | Wt 333.0 lb

## 2024-04-03 DIAGNOSIS — I152 Hypertension secondary to endocrine disorders: Secondary | ICD-10-CM | POA: Diagnosis not present

## 2024-04-03 DIAGNOSIS — E1169 Type 2 diabetes mellitus with other specified complication: Secondary | ICD-10-CM

## 2024-04-03 DIAGNOSIS — E66813 Obesity, class 3: Secondary | ICD-10-CM | POA: Diagnosis not present

## 2024-04-03 DIAGNOSIS — E1159 Type 2 diabetes mellitus with other circulatory complications: Secondary | ICD-10-CM

## 2024-04-03 DIAGNOSIS — Z7984 Long term (current) use of oral hypoglycemic drugs: Secondary | ICD-10-CM

## 2024-04-03 DIAGNOSIS — Z6841 Body Mass Index (BMI) 40.0 and over, adult: Secondary | ICD-10-CM

## 2024-04-03 NOTE — Assessment & Plan Note (Signed)
 Obesity with recent weight gain. Initially lost three pounds but regained the weight. Current caloric intake is approximately 1500 calories, above the target of 1300 calories. Discussed importance of maintaining a calorie deficit for weight loss. She is hesitant about anti-obesity medications but open to lifestyle modifications. Discussed potential use of anti-obesity medications, emphasizing their effectiveness and side effect profile, but requiring concurrent lifestyle changes.   - Provided /created a 7-day meal plan with 1300 calories, high in protein and low in carbohydrates.   - Recommend using MyFitnessPal app for tracking calories and meals.   - Suggest intermittent fasting with a 16:8 fasting schedule.   - Advise using meal replacements like protein shakes for breakfast.   - Encourage physical activity, such as walking for 10-15 minutes after work or using a treadmill at home.   - Discuss potential use of anti-obesity medications if lifestyle changes are insufficient.

## 2024-04-03 NOTE — Assessment & Plan Note (Signed)
 Her last A1c was 5.8, down from 6.3, but insulin  levels remain high at 24, indicating high degree of insulin  resistance.  She is now on metformin  twice a day and is experiencing some heartburn advised to take with dinner instead of after and to avoid eating 2 to 3 hours before bedtime.

## 2024-04-03 NOTE — Progress Notes (Signed)
 Office: [phone]  /  Fax: [phone]  Weight Summary And Biometrics  Vitals Temp: 98 F (36.7 C) BP: 121/77 Pulse Rate: 61 SpO2: 99 %   Anthropometric Measurements Height: 5\' 2"  (1.575 m) Weight: (!) 333 lb (151 kg) BMI (Calculated): 60.89 Weight at Last Visit: 330 lb Weight Lost Since Last Visit: 0 lb Weight Gained Since Last Visit: 3 lb Starting Weight: 332 lb Total Weight Loss (lbs): 0 lb (0 kg) Peak Weight: 338 lb   Body Composition  Body Fat %: 62.5 % Fat Mass (lbs): 208.4 lbs Muscle Mass (lbs): 118.6 lbs Visceral Fat Rating : 27    RMR: [id number]  Today's Visit #: 4  Starting Date: 12/28/23   Subjective   Chief Complaint: Obesity  Interval History Discussed the use of AI scribe software for clinical note transcription with the patient, who gave verbal consent to proceed.  History of Present Illness    Discussed the use of AI scribe software for clinical note transcription with the patient, who gave verbal consent to proceed.  History of Present Illness   Amy [name] is a 48 year old female with type 2 diabetes and hypertension who presents for medical weight management.  She is accompanied by her husband  She is relatively new to the weight management program and has experienced a net weight gain of three pounds since her last visit. Initially, she lost three pounds but has regained the weight over the past three months. She has been tracking her calorie intake, aiming for 1500 calories per day, which is above the recommended 1200 calories. She acknowledges a lapse in her diet during a recent vacation.  Her blood sugar levels have been higher recently. She is currently taking metformin  twice a day but reports that it exacerbates her acid reflux, particularly with the evening dose. She takes it after dinner, which worsens her symptoms.  She has not been consistent with physical activity, citing time constraints as a barrier. She has attempted  to increase her activity by doing more at home in the evenings and has recently purchased a stand-up desk to help with her sciatic nerve pain.  She has declined anti-obesity medications in the past and wants to manage her weight through lifestyle changes.           Challenges affecting patient progress: multiple competing priorities, work schedule, limited food variation or intolerances, exposure to enticing environments and/or relationships, low volume of physical activity at present , medical comorbidities, slow metabolism for age, and menopause.    Pharmacotherapy for weight management: She is currently taking Metformin  with diabetes as primary indication with adequate clinical response  and without side effects..   Assessment and Plan   Treatment Plan For Obesity:  Recommended Dietary Goals  Amy [name] is currently in the action stage of change. As such, her goal is to continue weight management plan. She has agreed to: keep a food journal with a target of  1200 calories per day and 90-120 grams of protein per day or 30-40 grams per meal. and continue current plan  Behavioral Health and Counseling  We discussed the following behavioral modification strategies today: increasing lean protein intake to established goals, decreasing simple carbohydrates , increasing vegetables, increasing lower glycemic fruits, increasing fiber rich foods, avoiding skipping meals, increasing water intake , work on meal planning and preparation, work on tracking and journaling calories using tracking application, and continue to work on implementation of reduced calorie nutritional plan.  Additional education and resources provided today:  Handout and personalized instruction on tracking and journaling using Apps and Provided with personal guidance and instructions on how to use [employer] for healthy meal ideas and cooking in bulk.  Recommended Physical Activity Goals  Amy [name] has been advised to work up to  150 minutes of moderate intensity aerobic activity a week and strengthening exercises 2-3 times per week for cardiovascular health, weight loss maintenance and preservation of muscle mass.   She has agreed to :  Think about enjoyable ways to increase daily physical activity and overcoming barriers to exercise and Increase physical activity in their day and reduce sedentary time (increase NEAT).  Pharmacotherapy  We discussed various medication options to help Amy [name] with her weight loss efforts and we both agreed to : adequate clinical response to anti-obesity medication, continue current regimen  Associated Conditions Impacted by Obesity Treatment  Hypertension associated with diabetes Amy [name]) Assessment & Plan: Blood pressure at goal for age and risk category.  On on losartan  and Bystolic  without adverse effects.  Most recent renal parameters reviewed which showed normal electrolytes and kidney function.  Continue with weight loss therapy. Losing 10% may improve blood pressure control. Monitor for symptoms of orthostasis while losing weight. Continue current regimen and home monitoring for a goal blood pressure of 120/80.     Type 2 diabetes mellitus with morbid obesity (HCC) Assessment & Plan: Her last A1c was 5.8, down from 6.3, but insulin  levels remain high at 24, indicating high degree of insulin  resistance.  She is now on metformin  twice a day and is experiencing some heartburn advised to take with dinner instead of after and to avoid eating 2 to 3 hours before bedtime.    Class 3 severe obesity with serious comorbidity and body mass index (BMI) of 60.0 to 69.9 in adult, unspecified obesity type Amy [name]) Assessment & Plan: Obesity with recent weight gain. Initially lost three pounds but regained the weight. Current caloric intake is approximately 1500 calories, above the target of 1300 calories. Discussed importance of maintaining a calorie deficit for weight loss. She is hesitant about  anti-obesity medications but open to lifestyle modifications. Discussed potential use of anti-obesity medications, emphasizing their effectiveness and side effect profile, but requiring concurrent lifestyle changes.   - Provided /created a 7-day meal plan with 1300 calories, high in protein and low in carbohydrates.   - Recommend using MyFitnessPal app for tracking calories and meals.   - Suggest intermittent fasting with a 16:8 fasting schedule.   - Advise using meal replacements like protein shakes for breakfast.   - Encourage physical activity, such as walking for 10-15 minutes after work or using a treadmill at home.   - Discuss potential use of anti-obesity medications if lifestyle changes are insufficient.                Objective   Physical Exam:  Blood pressure 121/77, pulse 61, temperature 98 F (36.7 C), height 5\' 2"  (1.575 m), weight (!) 333 lb (151 kg), SpO2 99%. Body mass index is 60.91 kg/m.  General: She is overweight, cooperative, alert, well developed, and in no acute distress. PSYCH: Has normal mood, affect and thought process.   HEENT: EOMI, sclerae are anicteric. Lungs: Normal breathing effort, no conversational dyspnea. Extremities: No edema.  Neurologic: No gross sensory or motor deficits. No tremors or fasciculations noted.    Diagnostic Data Reviewed:  BMET    Component Value Date/Time   NA 140 10/07/2023 0830   K 4.1 10/07/2023 0830   CL  101 10/07/2023 0830   CO2 27 10/07/2023 0830   GLUCOSE 95 10/07/2023 0830   BUN 7 10/07/2023 0830   CREATININE 0.59 10/07/2023 0830   CALCIUM  9.0 10/07/2023 0830   GFRNONAA 109 12/23/2020 0902   GFRAA 126 12/23/2020 0902   Lab Results  Component Value Date   HGBA1C 5.8 (H) 10/07/2023   HGBA1C 6.0 (H) 09/05/2017   Lab Results  Component Value Date   INSULIN  24.9 12/28/2023   Lab Results  Component Value Date   TSH 2.640 10/07/2023   CBC    Component Value Date/Time   WBC 8.9 10/07/2023 0830    RBC 4.53 10/07/2023 0830   HGB 12.9 10/07/2023 0830   HCT 41.1 10/07/2023 0830   PLT 199 10/07/2023 0830   MCV 91 10/07/2023 0830   MCH 28.5 10/07/2023 0830   MCHC 31.4 (L) 10/07/2023 0830   RDW 13.0 10/07/2023 0830   Iron Studies    Component Value Date/Time   IRON 81 09/05/2017 0850   Lipid Panel     Component Value Date/Time   CHOL 140 10/07/2023 0830   TRIG 81 10/07/2023 0830   HDL 45 10/07/2023 0830   CHOLHDL 3.5 09/05/2017 0850   LDLCALC 79 10/07/2023 0830   Hepatic Function Panel     Component Value Date/Time   PROT 6.4 10/07/2023 0830   ALBUMIN 3.7 (L) 10/07/2023 0830   AST 16 10/07/2023 0830   ALT 5 10/07/2023 0830   ALKPHOS 94 10/07/2023 0830   BILITOT 0.7 10/07/2023 0830      Component Value Date/Time   TSH 2.640 10/07/2023 0830   Nutritional Lab Results  Component Value Date   VD25OH 63.7 10/07/2023   VD25OH 51.5 10/04/2022   VD25OH 57.3 06/24/2021    Medications: Outpatient Encounter Medications as of 04/03/2024  Medication Sig   levocetirizine (XYZAL ) 5 MG tablet Take 1 tablet by mouth once daily   levothyroxine  (SYNTHROID ) 50 MCG tablet Take 1 tablet (50 mcg total) by mouth daily.   losartan  (COZAAR ) 25 MG tablet Take 1 tablet (25 mg total) by mouth daily.   meloxicam  (MOBIC ) 7.5 MG tablet Take 1 tablet (7.5 mg total) by mouth daily.   metFORMIN  (GLUCOPHAGE ) 500 MG tablet Take 1 tablet (500 mg total) by mouth 2 (two) times daily with a meal.   methocarbamol  (ROBAXIN ) 500 MG tablet Take 1 tablet (500 mg total) by mouth every 8 (eight) hours as needed for muscle spasms.   Multiple Vitamin (MULTIVITAMINS PO) Take 1 tablet by mouth daily.   nebivolol  (BYSTOLIC ) 5 MG tablet Take 1 tablet (5 mg total) by mouth daily. May take an extra 5 mg as needed for tachycardia   rosuvastatin  (CRESTOR ) 10 MG tablet Take 1 tablet (10 mg total) by mouth 2 (two) times a week.   traMADol  (ULTRAM ) 50 MG tablet Take 50 mg by mouth 3 (three) times daily as needed.    Vitamin D , Ergocalciferol , (DRISDOL ) 1.25 MG (50000 UNIT) CAPS capsule Take 1 capsule (50,000 Units total) by mouth once a week.   No facility-administered encounter medications on file as of 04/03/2024.     Follow-Up   Return in about 5 weeks (around 05/08/2024) for For Weight Mangement with Dr. Allie Area.Aaron Aas She was informed of the importance of frequent follow up visits to maximize her success with intensive lifestyle modifications for her multiple health conditions.  Attestation Statement   Reviewed by clinician on day of visit: allergies, medications, problem list, medical history, surgical history, family history, social  history, and previous encounter notes.     Ladd Picker, MD

## 2024-04-03 NOTE — Assessment & Plan Note (Signed)
 Blood pressure at goal for age and risk category.  On on losartan and Bystolic without adverse effects.  Most recent renal parameters reviewed which showed normal electrolytes and kidney function.  Continue with weight loss therapy. Losing 10% may improve blood pressure control. Monitor for symptoms of orthostasis while losing weight. Continue current regimen and home monitoring for a goal blood pressure of 120/80.

## 2024-04-06 ENCOUNTER — Encounter: Payer: Self-pay | Admitting: Nurse Practitioner

## 2024-04-06 ENCOUNTER — Ambulatory Visit: Payer: Self-pay | Admitting: Nurse Practitioner

## 2024-04-06 DIAGNOSIS — E039 Hypothyroidism, unspecified: Secondary | ICD-10-CM

## 2024-04-06 DIAGNOSIS — E1169 Type 2 diabetes mellitus with other specified complication: Secondary | ICD-10-CM

## 2024-04-06 DIAGNOSIS — Z6841 Body Mass Index (BMI) 40.0 and over, adult: Secondary | ICD-10-CM

## 2024-04-06 DIAGNOSIS — I152 Hypertension secondary to endocrine disorders: Secondary | ICD-10-CM

## 2024-04-06 DIAGNOSIS — E785 Hyperlipidemia, unspecified: Secondary | ICD-10-CM

## 2024-04-06 DIAGNOSIS — M542 Cervicalgia: Secondary | ICD-10-CM

## 2024-04-06 DIAGNOSIS — I479 Paroxysmal tachycardia, unspecified: Secondary | ICD-10-CM

## 2024-04-06 DIAGNOSIS — E1159 Type 2 diabetes mellitus with other circulatory complications: Secondary | ICD-10-CM

## 2024-04-06 DIAGNOSIS — E66813 Obesity, class 3: Secondary | ICD-10-CM | POA: Diagnosis not present

## 2024-04-06 LAB — BAYER DCA HB A1C WAIVED: HB A1C (BAYER DCA - WAIVED): 6 % — ABNORMAL HIGH (ref 4.8–5.6)

## 2024-04-06 NOTE — Progress Notes (Deleted)
 BP 118/73   Pulse 73   Temp 98.6 F (37 C) (Oral)   SpO2 98%    Subjective:    Patient ID: Amy [name], female    DOB: February 12, 1976, 48 y.o.   MRN: [id number]  HPI: Amy [name] is a 48 y.o. female  Chief Complaint  Patient presents with   Diabetes    Eye exam requested from Dr. Parke Boll   Hypertension   Hyperlipidemia   DIABETES October 5.8% A1c.  Continues Metformin  500 MG at night.    Hypoglycemic episodes:{Blank single:19197::"yes","no"} Polydipsia/polyuria: {Blank single:19197::"yes","no"} Visual disturbance: {Blank single:19197::"yes","no"} Chest pain: {Blank single:19197::"yes","no"} Paresthesias: {Blank single:19197::"yes","no"} Glucose Monitoring: {Blank single:19197::"yes","no"}  Accucheck frequency: {Blank single:19197::"Not Checking","Daily","BID","TID"}  Fasting glucose:  Post prandial:  Evening:  Before meals: Taking Insulin ?: {Blank single:19197::"yes","no"}  Long acting insulin :  Short acting insulin : Blood Pressure Monitoring: {Blank single:19197::"not checking","rarely","daily","weekly","monthly","a few times a day","a few times a week","a few times a month"} Retinal Examination: {Blank single:19197::"Up to Date","Not up to Date"} Foot Exam: {Blank single:19197::"Up to Date","Not up to Date"} Diabetic Education: {Blank single:19197::"Completed","Not Completed"} Pneumovax: {Blank single:19197::"Up to Date","Not up to Date","unknown"} Influenza: {Blank single:19197::"Up to Date","Not up to Date","unknown"} Aspirin: {Blank single:19197::"yes","no"}   HYPERTENSION / HYPERLIPIDEMIA Taking Losartan  25 MG daily, Bystolic  5 MG daily, and Crestor  10 MG daily. Last visit with Dr. Gollan, cardiology, due to tachycardia, was on 10/14/23, no changes.   Satisfied with current treatment? {Blank single:19197::"yes","no"} Duration of hypertension: {Blank single:19197::"chronic","months","years"} BP monitoring frequency: {Blank single:19197::"not  checking","rarely","daily","weekly","monthly","a few times a day","a few times a week","a few times a month"} BP range:  BP medication side effects: {Blank single:19197::"yes","no"} Duration of hyperlipidemia: {Blank single:19197::"chronic","months","years"} Cholesterol medication side effects: {Blank single:19197::"yes","no"} Cholesterol supplements: {Blank multiple:19196::"none","fish oil","niacin","red yeast rice"} Medication compliance: {Blank single:19197::"excellent compliance","good compliance","fair compliance","poor compliance"} Aspirin: {Blank single:19197::"yes","no"} Recent stressors: {Blank single:19197::"yes","no"} Recurrent headaches: {Blank single:19197::"yes","no"} Visual changes: {Blank single:19197::"yes","no"} Palpitations: {Blank single:19197::"yes","no"} Dyspnea: {Blank single:19197::"yes","no"} Chest pain: {Blank single:19197::"yes","no"} Lower extremity edema: {Blank single:19197::"yes","no"} Dizzy/lightheaded: {Blank single:19197::"yes","no"}   HYPOTHYROIDISM Taking Levothyroxine  50 MCG.  Continues Vit D and B12 supplements.  Thyroid control status:{Blank single:19197::"controlled","uncontrolled","better","worse","exacerbated","stable"} Satisfied with current treatment? {Blank single:19197::"yes","no"} Medication side effects: {Blank single:19197::"yes","no"} Medication compliance: {Blank single:19197::"excellent compliance","good compliance","fair compliance","poor compliance"} Etiology of hypothyroidism:  Recent dose adjustment:{Blank single:19197::"yes","no"} Fatigue: {Blank single:19197::"yes","no"} Cold intolerance: {Blank single:19197::"yes","no"} Heat intolerance: {Blank single:19197::"yes","no"} Weight gain: {Blank single:19197::"yes","no"} Weight loss: {Blank single:19197::"yes","no"} Constipation: {Blank single:19197::"yes","no"} Diarrhea/loose stools: {Blank single:19197::"yes","no"} Palpitations: {Blank single:19197::"yes","no"} Lower extremity  edema: {Blank single:19197::"yes","no"} Anxiety/depressed mood: {Blank single:19197::"yes","no"}   Relevant past medical, surgical, family and social history reviewed and updated as indicated. Interim medical history since our last visit reviewed. Allergies and medications reviewed and updated.  Review of Systems  Per HPI unless specifically indicated above     Objective:    BP 118/73   Pulse 73   Temp 98.6 F (37 C) (Oral)   SpO2 98%   Wt Readings from Last 3 Encounters:  04/03/24 (!) 333 lb (151 kg)  02/28/24 (!) 330 lb (149.7 kg)  01/11/24 (!) 329 lb (149.2 kg)    Physical Exam  Results for orders placed or performed in visit on 12/28/23  Insulin , random   Collection Time: 12/28/23 10:27 AM  Result Value Ref Range   INSULIN  24.9 2.6 - 24.9 uIU/mL      Assessment & Plan:   Problem List Items Addressed This Visit       Cardiovascular and Mediastinum   Paroxysmal tachycardia (HCC)   Hypertension associated with diabetes (HCC)   Relevant Orders   Bayer  DCA Hb A1c Waived   Comprehensive metabolic panel with GFR     Endocrine   Type 2 diabetes mellitus with morbid obesity (HCC) - Primary   Relevant Orders   Bayer DCA Hb A1c Waived   Comprehensive metabolic panel with GFR   Hypothyroid   Hyperlipidemia associated with type 2 diabetes mellitus (HCC)   Relevant Orders   Bayer DCA Hb A1c Waived   Comprehensive metabolic panel with GFR   Lipid Panel w/o Chol/HDL Ratio     Other   Class 3 severe obesity with serious comorbidity and body mass index (BMI) of 60.0 to 69.9 in adult [facility])     Follow up plan: Return in about 6 months (around 10/06/2024) for Annual Physical -- after 10/06/24.

## 2024-04-06 NOTE — Assessment & Plan Note (Signed)
 Chronic, stable. A1c 6.0% today. Continue Metformin  500 mg BID. Recommend eating  smaller more frequent meals consisting of high protein low fat meals. Recommend exercising 5 times per week for 30 mins per day. Return in 6 months.

## 2024-04-06 NOTE — Assessment & Plan Note (Signed)
 Ongoing obesity with recent weight gain. Remains uninterested in weight loss injections. Would like to try weight loss therapy first. She was provided meal plans with 1300 calories consisting of high in protein and low in carbohydrates. Recommended exercising 5 times per week for 30 min per day. Weight loss goal of 11-13 lbs. Return in 6 months.

## 2024-04-06 NOTE — Assessment & Plan Note (Signed)
 Stable. Continue current medication regimen as ordered by cardiology. Patient is tolerating this regimen well. Continue collaboration with cardiology. HR stable at this time with no symptoms present. Return in 6 months.

## 2024-04-06 NOTE — Assessment & Plan Note (Signed)
 Chronic, stable. Continue current medication regimen. Will adjust as needed. Return in 6 months.

## 2024-04-06 NOTE — Assessment & Plan Note (Addendum)
 Chronic, ongoing. BP at goal today in office. Continue current medication regimen. Recommend to continue monitoring BP at home and document for provider. Continue with weight loss therapy. Recommend exercising 5 times per week for 30 mins per day. Recommend eating smaller more frequent meals consisting of high protein low fat meals. Return in 6 months.

## 2024-04-06 NOTE — Progress Notes (Addendum)
 BP 118/73   Pulse 73   Temp 98.6 F (37 C) (Oral)   SpO2 98%    Subjective:    Patient ID: Amy [name], female    DOB: 07-14-1976, 48 y.o.   MRN: [id number]  HPI: Amy [name] is a 48 y.o. female no new problems or concerns to report.  Chief Complaint  Patient presents with   Diabetes    Eye exam requested from Dr. Parke Boll   Hypertension   Hyperlipidemia   NOTE WRITTEN BY DNP STUDENT.  ASSESSMENT AND PLAN OF CARE REVIEWED WITH STUDENT, AGREE WITH ABOVE FINDINGS AND PLAN.   DIABETES October 5.8% A1c.  Continues Metformin  500 MG at night.    Hypoglycemic episodes:no Polydipsia/polyuria: no Visual disturbance: no Chest pain: no Paresthesias: no Glucose Monitoring: yes  Accucheck frequency: Daily  Fasting glucose: 113  Taking Insulin ?: no  Blood Pressure Monitoring: daily Retinal Examination: Up to Date Foot Exam: Up to Date Diabetic Education: Completed Pneumovax: Up to Date Influenza: Up to Date Aspirin: no   HYPERTENSION / HYPERLIPIDEMIA Taking Losartan  25 MG daily, Bystolic  5 MG daily, and Crestor  10 MG daily. Last visit with Dr. Gollan, cardiology, due to tachycardia, was on 10/14/23, no changes.   Satisfied with current treatment? yes Duration of hypertension: chronic BP monitoring frequency: daily BP medication side effects: no Duration of hyperlipidemia: chronic Cholesterol medication side effects: yes taking twice a week due to leg cramps Cholesterol supplements: none Medication compliance: good compliance Aspirin: no Recent stressors: no Recurrent headaches: no Visual changes: no Palpitations: no Dyspnea: no Chest pain: no Lower extremity edema: no Dizzy/lightheaded: no   HYPOTHYROIDISM Taking Levothyroxine  50 MCG.  Continues Vit D and B12 supplements.  Thyroid control status:stable Satisfied with current treatment? yes Medication side effects: no Medication compliance: good compliance Recent dose adjustment:no Fatigue: no Cold  intolerance: no Heat intolerance: no Weight gain: no Weight loss: no Constipation: no Diarrhea/loose stools: no Palpitations: no Lower extremity edema: no Anxiety/depressed mood: no   Relevant past medical, surgical, family and social history reviewed and updated as indicated. Interim medical history since our last visit reviewed. Allergies and medications reviewed and updated.  Review of Systems  Constitutional:  Negative for appetite change and fatigue.  HENT: Negative.    Eyes: Negative.   Respiratory:  Negative for chest tightness and shortness of breath.   Cardiovascular:  Negative for chest pain and leg swelling.  Gastrointestinal: Negative.   Endocrine: Negative for polydipsia and polyuria.  Genitourinary:  Negative for frequency and urgency.  Allergic/Immunologic: Negative.   Neurological:  Negative for light-headedness and headaches.  Hematological: Negative.     Per HPI unless specifically indicated above     Objective:    BP 118/73   Pulse 73   Temp 98.6 F (37 C) (Oral)   SpO2 98%   Wt Readings from Last 3 Encounters:  04/03/24 (!) 333 lb (151 kg)  02/28/24 (!) 330 lb (149.7 kg)  01/11/24 (!) 329 lb (149.2 kg)    Physical Exam Vitals reviewed.  Constitutional:      Appearance: Normal appearance. She is well-developed and well-groomed. She is morbidly obese. She is not ill-appearing.  Neck:     Thyroid: No thyroid mass.     Vascular: No carotid bruit.  Cardiovascular:     Rate and Rhythm: Normal rate and regular rhythm.     Heart sounds: Normal heart sounds. No murmur heard. Pulmonary:     Effort: Pulmonary effort is normal. No respiratory  distress.     Breath sounds: Normal breath sounds and air entry. No wheezing.  Abdominal:     General: Bowel sounds are normal.     Palpations: Abdomen is soft.  Musculoskeletal:        General: Normal range of motion.     Cervical back: Normal range of motion and neck supple.     Right lower leg: No edema.      Left lower leg: No edema.  Lymphadenopathy:     Cervical: No cervical adenopathy.     Right cervical: No superficial cervical adenopathy.    Left cervical: No superficial cervical adenopathy.  Skin:    General: Skin is warm and dry.  Neurological:     General: No focal deficit present.     Mental Status: She is alert and oriented to person, place, and time. Mental status is at baseline.     Deep Tendon Reflexes: Reflexes are normal and symmetric.  Psychiatric:        Attention and Perception: Attention and perception normal.        Mood and Affect: Mood and affect normal.        Speech: Speech normal.        Behavior: Behavior normal. Behavior is cooperative.        Thought Content: Thought content normal.        Cognition and Memory: Cognition and memory normal.        Judgment: Judgment normal.    Diabetic Foot Exam - Simple   Simple Foot Form Visual Inspection No deformities, no ulcerations, no other skin breakdown bilaterally: Yes Sensation Testing Intact to touch and monofilament testing bilaterally: Yes Pulse Check Posterior Tibialis and Dorsalis pulse intact bilaterally: Yes Comments      Results for orders placed or performed in visit on 04/06/24  Bayer DCA Hb A1c Waived   Collection Time: 04/06/24  8:30 AM  Result Value Ref Range   HB A1C (BAYER DCA - WAIVED) 6.0 (H) 4.8 - 5.6 %      Assessment & Plan:   Problem List Items Addressed This Visit       Cardiovascular and Mediastinum   Paroxysmal tachycardia (HCC)   Stable. Continue current medication regimen as ordered by cardiology. Patient is tolerating this regimen well. Continue collaboration with cardiology. HR stable at this time with no symptoms present. Return in 6 months.      Hypertension associated with diabetes (HCC)   Chronic, ongoing. BP at goal today in office. Continue current medication regimen. Recommend to continue monitoring BP at home and document for provider. Continue with weight  loss therapy. Recommend exercising 5 times per week for 30 mins per day. Recommend eating smaller more frequent meals consisting of high protein low fat meals. Return in 6 months.      Relevant Orders   Bayer DCA Hb A1c Waived (Completed)   Comprehensive metabolic panel with GFR     Endocrine   Type 2 diabetes mellitus with morbid obesity (HCC) - Primary   Chronic, stable. A1c 6.0% today. Continue Metformin  500 mg BID. Recommend eating  smaller more frequent meals consisting of high protein low fat meals. Recommend exercising 5 times per week for 30 mins per day. Return in 6 months.      Relevant Orders   Bayer DCA Hb A1c Waived (Completed)   Comprehensive metabolic panel with GFR   Hypothyroid   Chronic, stable. Continue current medication regimen. Will adjust as needed. Return in 6  months.      Hyperlipidemia associated with type 2 diabetes mellitus (HCC)   Chronic, stable. Continue current medication regimen. Rosuvastatin  10 mg twice weekly. Unable to tolerate daily dosage due to leg cramps. Return in 6 months. Labs today.      Relevant Orders   Bayer DCA Hb A1c Waived (Completed)   Comprehensive metabolic panel with GFR   Lipid Panel w/o Chol/HDL Ratio     Other   Class 3 severe obesity with serious comorbidity and body mass index (BMI) of 60.0 to 69.9 in adult [facility])   Ongoing obesity with recent weight gain. Remains uninterested in weight loss injections. Would like to try weight loss therapy first. She was provided meal plans with 1300 calories consisting of high in protein and low in carbohydrates. Recommended exercising 5 times per week for 30 min per day. Weight loss goal of 11-13 lbs. Return in 6 months.         Follow up plan: Return in about 6 months (around 10/06/2024) for Annual Physical -- after 10/06/24.

## 2024-04-06 NOTE — Assessment & Plan Note (Addendum)
 Chronic, stable. Continue current medication regimen. Rosuvastatin  10 mg twice weekly. Unable to tolerate daily dosage due to leg cramps. Return in 6 months. Labs today.

## 2024-04-07 LAB — LIPID PANEL W/O CHOL/HDL RATIO
Cholesterol, Total: 139 mg/dL (ref 100–199)
HDL: 43 mg/dL (ref >39)
LDL Chol Calc (NIH): 80 mg/dL (ref 0–99)
Triglycerides: 84 mg/dL (ref 0–149)
VLDL Cholesterol Cal: 16 mg/dL (ref 5–40)

## 2024-04-07 LAB — COMPREHENSIVE METABOLIC PANEL WITH GFR
ALT: 7 IU/L (ref 0–32)
AST: 18 IU/L (ref 0–40)
Albumin: 3.8 g/dL — ABNORMAL LOW (ref 3.9–4.9)
Alkaline Phosphatase: 96 IU/L (ref 44–121)
BUN/Creatinine Ratio: 15 (ref 9–23)
BUN: 9 mg/dL (ref 6–24)
Bilirubin Total: 0.9 mg/dL (ref 0.0–1.2)
CO2: 25 mmol/L (ref 20–29)
Calcium: 9.2 mg/dL (ref 8.7–10.2)
Chloride: 102 mmol/L (ref 96–106)
Creatinine, Ser: 0.62 mg/dL (ref 0.57–1.00)
Globulin, Total: 2.8 g/dL (ref 1.5–4.5)
Glucose: 105 mg/dL — ABNORMAL HIGH (ref 70–99)
Potassium: 4.1 mmol/L (ref 3.5–5.2)
Sodium: 141 mmol/L (ref 134–144)
Total Protein: 6.6 g/dL (ref 6.0–8.5)
eGFR: 110 mL/min/{1.73_m2} (ref >59)

## 2024-04-08 ENCOUNTER — Encounter: Payer: Self-pay | Admitting: Nurse Practitioner

## 2024-04-08 NOTE — Progress Notes (Signed)
 Contacted via MyChart   Good morning Briante, your labs have returned: - Kidney function, creatinine and eGFR, remains normal, as is liver function, AST and ALT.  - Lipid panel stable.  No changes needed.  Any questions? Keep being incredible!!  Thank you for allowing me to participate in your care.  I appreciate you. Kindest regards, Kamron Vanwyhe

## 2024-05-17 ENCOUNTER — Ambulatory Visit (INDEPENDENT_AMBULATORY_CARE_PROVIDER_SITE_OTHER): Admitting: Internal Medicine

## 2024-06-20 ENCOUNTER — Ambulatory Visit (INDEPENDENT_AMBULATORY_CARE_PROVIDER_SITE_OTHER): Admitting: Internal Medicine

## 2024-10-06 NOTE — Patient Instructions (Signed)

## 2024-10-08 ENCOUNTER — Encounter: Payer: Self-pay | Admitting: Nurse Practitioner

## 2024-10-08 ENCOUNTER — Ambulatory Visit: Payer: Self-pay | Admitting: Nurse Practitioner

## 2024-10-08 ENCOUNTER — Ambulatory Visit: Admitting: Nurse Practitioner

## 2024-10-08 ENCOUNTER — Ambulatory Visit
Admission: RE | Admit: 2024-10-08 | Discharge: 2024-10-08 | Disposition: A | Discharge status: Home / Self Care | Source: Ambulatory Visit | Attending: Nurse Practitioner | Admitting: Nurse Practitioner

## 2024-10-08 DIAGNOSIS — Z6841 Body Mass Index (BMI) 40.0 and over, adult: Secondary | ICD-10-CM

## 2024-10-08 DIAGNOSIS — M79662 Pain in left lower leg: Secondary | ICD-10-CM | POA: Diagnosis present

## 2024-10-08 DIAGNOSIS — Z0000 Encounter for general adult medical examination without abnormal findings: Secondary | ICD-10-CM | POA: Diagnosis not present

## 2024-10-08 DIAGNOSIS — E559 Vitamin D deficiency, unspecified: Secondary | ICD-10-CM

## 2024-10-08 DIAGNOSIS — Z23 Encounter for immunization: Secondary | ICD-10-CM

## 2024-10-08 DIAGNOSIS — E66813 Obesity, class 3: Secondary | ICD-10-CM | POA: Diagnosis not present

## 2024-10-08 DIAGNOSIS — E039 Hypothyroidism, unspecified: Secondary | ICD-10-CM

## 2024-10-08 DIAGNOSIS — G8929 Other chronic pain: Secondary | ICD-10-CM

## 2024-10-08 DIAGNOSIS — E538 Deficiency of other specified B group vitamins: Secondary | ICD-10-CM

## 2024-10-08 DIAGNOSIS — I479 Paroxysmal tachycardia, unspecified: Secondary | ICD-10-CM | POA: Diagnosis not present

## 2024-10-08 DIAGNOSIS — E1159 Type 2 diabetes mellitus with other circulatory complications: Secondary | ICD-10-CM

## 2024-10-08 DIAGNOSIS — I152 Hypertension secondary to endocrine disorders: Secondary | ICD-10-CM

## 2024-10-08 DIAGNOSIS — E1169 Type 2 diabetes mellitus with other specified complication: Secondary | ICD-10-CM | POA: Diagnosis not present

## 2024-10-08 DIAGNOSIS — Z1231 Encounter for screening mammogram for malignant neoplasm of breast: Secondary | ICD-10-CM

## 2024-10-08 LAB — MICROALBUMIN, URINE WAIVED
Creatinine, Urine Waived: 200 mg/dL (ref 10–300)
Microalb, Ur Waived: 80 mg/L — ABNORMAL HIGH (ref 0–19)

## 2024-10-08 LAB — BAYER DCA HB A1C WAIVED: HB A1C (BAYER DCA - WAIVED): 5.9 % — ABNORMAL HIGH (ref 4.8–5.6)

## 2024-10-08 MED ORDER — LOSARTAN POTASSIUM 25 MG PO TABS: 25.0000 mg | ORAL_TABLET | Freq: Every day | ORAL | 4 refills | Status: AC

## 2024-10-08 MED ORDER — VITAMIN D (ERGOCALCIFEROL) 1.25 MG (50000 UNIT) PO CAPS: 50000.0000 [IU] | ORAL_CAPSULE | ORAL | 4 refills | Status: AC

## 2024-10-08 MED ORDER — METFORMIN HCL 500 MG PO TABS: 500.0000 mg | ORAL_TABLET | Freq: Two times a day (BID) | ORAL | 4 refills | Status: DC

## 2024-10-08 MED ORDER — LEVOTHYROXINE SODIUM 50 MCG PO TABS: 50.0000 ug | ORAL_TABLET | Freq: Every day | ORAL | 4 refills | Status: AC

## 2024-10-08 MED ORDER — OZEMPIC (0.25 OR 0.5 MG/DOSE) 2 MG/3ML ~~LOC~~ SOPN: PEN_INJECTOR | SUBCUTANEOUS | 3 refills | Status: DC

## 2024-10-08 MED ORDER — METFORMIN HCL 500 MG PO TABS: 500.0000 mg | ORAL_TABLET | Freq: Every day | ORAL | 4 refills | Status: DC

## 2024-10-08 MED ORDER — ROSUVASTATIN CALCIUM 10 MG PO TABS: 10.0000 mg | ORAL_TABLET | ORAL | 4 refills | Status: DC

## 2024-10-08 NOTE — Progress Notes (Signed)
 BP 122/75 (BP Location: Left Arm, Patient Position: Sitting, Cuff Size: Large)   Pulse 69   Temp 97.7 F (36.5 C) (Oral)   Resp 18   Ht 5' 2 (1.575 m)   Wt (!) 328 lb 6.4 oz (149 kg)   SpO2 99%   BMI 60.07 kg/m    Subjective:    Patient ID: Amy [name], female    DOB: 12-10-1976, 48 y.o.   MRN: [id number]  HPI: Amy [name] is a 48 y.o. female presenting on 10/08/2024 for comprehensive medical examination. Current medical complaints include:none  She currently lives with: [name] Menopausal Symptoms: has not had a cycle in one year.  DIABETES April A1c 6%.  Continues Metformin  500 MG daily. Saw weight management last on 04/03/24. When took Metformin  BID caused worsening diarrhea. Would like to try Ozempic or Trulicity. Hypoglycemic episodes:no Polydipsia/polyuria: no Visual disturbance: no Chest pain: no Paresthesias: no Glucose Monitoring: yes             Accucheck frequency: Daily             Fasting glucose: 96 to 136             Post prandial:             Evening:             Before meals: Taking Insulin ?: no             Long acting insulin :             Short acting insulin : Blood Pressure Monitoring: daily Retinal Examination: Up to Date -- Dr. Carolee goes every 6 months Foot Exam: Up to Date Pneumovax: Up to Date Influenza: Up to Date Aspirin: no    HYPERTENSION / HYPERLIPIDEMIA Taking Losartan  25 MG daily, Bystolic  5 MG daily, and Crestor  10 MG daily. Saw cardiology last on 10/14/23 for history of tachycardia. Satisfied with current treatment? yes Duration of hypertension: chronic BP monitoring frequency: daily BP range: 98/43 to 109/54 BP medication side effects: no Past BP meds: as above Duration of hyperlipidemia: chronic Cholesterol medication side effects: no Cholesterol supplements: none Past cholesterol medications: none Medication compliance: good compliance Aspirin: no Recent stressors: no Recurrent headaches: no Visual changes:  no Palpitations: no Dyspnea: no Chest pain: no Lower extremity edema: reports left calf has been hurting, more swollen -- ongoing for one month.  Has sciatic pain at baseline, but this is different.  More she walks the more her feet feel sore.  Very flat footed. Muscle relaxer taken, but is not helping. If up all day long the more her lower left back hurts the more her left calf hurts. Dizzy/lightheaded: no  The 10-year ASCVD risk score (Arnett DK, et al., 2019) is: 2%   Values used to calculate the score:     Age: 20 years     Clincally relevant sex: Female     Is Non-Hispanic African American: No     Diabetic: Yes     Tobacco smoker: No     Systolic Blood Pressure: 122 mmHg     Is BP treated: Yes     HDL Cholesterol: 43 mg/dL     Total Cholesterol: 139 mg/dL  HYPOTHYROIDISM Continues Levothyroxine  50 MCG. Vitamin D  weekly with history of low levels.  Takes B12 for history of lows. Thyroid control status:stable Satisfied with current treatment? yes Medication side effects: no Medication compliance: good compliance Etiology of hypothyroidism:  Recent dose adjustment:no Fatigue: no Cold  intolerance: no Heat intolerance: no Weight gain: no Weight loss: no Constipation: occasional Diarrhea/loose stools: occasional Palpitations: no Lower extremity edema: as above Anxiety/depressed mood: no   Depression Screen done today and results listed below:     10/08/2024    8:54 AM 04/06/2024    8:27 AM 10/07/2023    8:24 AM 07/11/2023   10:25 AM 06/20/2023    9:50 AM  Depression screen PHQ 2/9  Decreased Interest 0 0 0 0 0  Down, Depressed, Hopeless 0 0 0 0 0  PHQ - 2 Score 0 0 0 0 0  Altered sleeping 1 0 0 0 0  Tired, decreased energy 0 0 0 0 0  Change in appetite 0 0 0 0 0  Feeling bad or failure about yourself  0 0 0 0 0  Trouble concentrating 0 0 0 0 0  Moving slowly or fidgety/restless 0 0 0 0 0  Suicidal thoughts 0 0 0 0 0  PHQ-9 Score 1 0 0 0 0  Difficult doing  work/chores Not difficult at all Not difficult at all Not difficult at all Not difficult at all Not difficult at all      10/08/2024    8:54 AM 04/06/2024    8:27 AM 10/07/2023    8:24 AM 07/11/2023   10:25 AM  GAD 7 : Generalized Anxiety Score  Nervous, Anxious, on Edge 0 0 0 0  Control/stop worrying 0 0 0 0  Worry too much - different things 0 0 0 0  Trouble relaxing 0 0 0 0  Restless 0 0 0 0  Easily annoyed or irritable 0 0 0 0  Afraid - awful might happen 0 0 0 0  Total GAD 7 Score 0 0 0 0  Anxiety Difficulty Not difficult at all Not difficult at all Not difficult at all Not difficult at all      06/20/2023    9:50 AM 07/11/2023   10:25 AM 10/07/2023    8:24 AM 04/06/2024    8:27 AM 10/08/2024    8:54 AM  Fall Risk  Falls in the past year? 0 0 0 0 0  Was there an injury with Fall? 0 0 0 0 0  Fall Risk Category Calculator 0 0 0 0 0  Patient at Risk for Falls Due to No Fall Risks No Fall Risks No Fall Risks No Fall Risks No Fall Risks  Fall risk Follow up Falls evaluation completed Falls evaluation completed Falls evaluation completed Falls evaluation completed Falls evaluation completed    Past Medical History:  Past Medical History:  Diagnosis Date   Allergy     Asthma    Back pain    Diabetes mellitus without complication (HCC)    Essential hypertension    Heart burn    Hypothyroidism    Joint pain    Obesity    Prediabetes    Psoriasis    Sinus tachycardia    Thyroid disease 2021   Vitamin B 12 deficiency    Vitamin D  deficiency     Surgical History:  Past Surgical History:  Procedure Laterality Date   CESAREAN SECTION     X 2   COLONOSCOPY WITH PROPOFOL  N/A 11/17/2022   Procedure: COLONOSCOPY WITH PROPOFOL ;  Surgeon: Unk Corinn Skiff, MD;  Location: [facility] ENDOSCOPY;  Service: Gastroenterology;  Laterality: N/A;   HAND SURGERY Right    THUMB   HERNIA REPAIR     right thumb surgery      Medications:  Current Outpatient Medications on File Prior to  Visit  Medication Sig   levocetirizine (XYZAL ) 5 MG tablet Take 1 tablet by mouth once daily   methocarbamol  (ROBAXIN ) 500 MG tablet Take 1 tablet (500 mg total) by mouth every 8 (eight) hours as needed for muscle spasms.   Multiple Vitamin (MULTIVITAMINS PO) Take 1 tablet by mouth daily.   nebivolol  (BYSTOLIC ) 5 MG tablet Take 1 tablet (5 mg total) by mouth daily. May take an extra 5 mg as needed for tachycardia   No current facility-administered medications on file prior to visit.    Allergies:  Allergies  Allergen Reactions   Bee Venom Anaphylaxis   Biaxin [Clarithromycin] Nausea And Vomiting   Rosemary Oil Rash    Social History:  Social History   Socioeconomic History   Marital status: Married    Spouse name: Lemond   Number of children: Not on file   Years of education: Not on file   Highest education level: Associate degree: occupational, scientist, product/process development, or vocational program  Occupational History   Not on file  Tobacco Use   Smoking status: Former    Current packs/day: 0.00    Average packs/day: 0.5 packs/day for 5.0 years (2.5 ttl pk-yrs)    Types: Cigarettes    Start date: 01/05/2000    Quit date: 01/04/2005    Years since quitting: 19.7   Smokeless tobacco: Never  Vaping Use   Vaping status: Never Used  Substance and Sexual Activity   Alcohol use: No   Drug use: No   Sexual activity: Yes    Comment: Tubal ligation  Other Topics Concern   Not on file  Social History Narrative   Not on file   Social Drivers of Health   Financial Resource Strain: Patient Declined (04/02/2023)   Overall Financial Resource Strain (CARDIA)    Difficulty of Paying Living Expenses: Patient declined  Food Insecurity: No Food Insecurity (04/02/2023)   Hunger Vital Sign    Worried About Running Out of Food in the Last Year: Never true    Ran Out of Food in the Last Year: Never true  Transportation Needs: No Transportation Needs (04/02/2023)   PRAPARE - Scientist, Research (physical Sciences) (Medical): No    Lack of Transportation (Non-Medical): No  Physical Activity: Unknown (04/02/2023)   Exercise Vital Sign    Days of Exercise per Week: Patient declined    Minutes of Exercise per Session: Not on file  Stress: No Stress Concern Present (04/02/2023)   [employer] of Occupational Health - Occupational Stress Questionnaire    Feeling of Stress : Not at all  Social Connections: Socially Integrated (04/02/2023)   Social Connection and Isolation Panel    Frequency of Communication with Friends and Family: More than three times a week    Frequency of Social Gatherings with Friends and Family: Twice a week    Attends Religious Services: More than 4 times per year    Active Member of [employer] or Organizations: Yes    Attends Banker Meetings: Not on file    Marital Status: Married  Catering Manager Violence: Not on file   Social History   Tobacco Use  Smoking Status Former   Current packs/day: 0.00   Average packs/day: 0.5 packs/day for 5.0 years (2.5 ttl pk-yrs)   Types: Cigarettes   Start date: 01/05/2000   Quit date: 01/04/2005   Years since quitting: 19.7  Smokeless Tobacco Never   Social History  Substance and Sexual Activity  Alcohol Use No    Family History:  Family History  Problem Relation Age of Onset   Hypertension Mother    Thyroid disease Mother    Autoimmune disease Mother    Anemia Mother    Kidney disease Mother    Cirrhosis Father    Glaucoma Father    Alcoholism Father    Drug abuse Father    Thyroid disease Maternal Grandmother    Osteoporosis Maternal Grandmother    Hypertension Maternal Grandmother    Diabetes Maternal Grandmother    Congestive Heart Failure Paternal Grandmother    Appendicitis Paternal Grandfather    Asthma Daughter    Allergies Daughter    ADD / ADHD Son    Sleep apnea Neg Hx     Past medical history, surgical history, medications, allergies, family history and social history reviewed  with patient today and changes made to appropriate areas of the chart.   ROS All other ROS negative except what is listed above and in the HPI.      Objective:    BP 122/75 (BP Location: Left Arm, Patient Position: Sitting, Cuff Size: Large)   Pulse 69   Temp 97.7 F (36.5 C) (Oral)   Resp 18   Ht 5' 2 (1.575 m)   Wt (!) 328 lb 6.4 oz (149 kg)   SpO2 99%   BMI 60.07 kg/m   Wt Readings from Last 3 Encounters:  10/08/24 (!) 328 lb 6.4 oz (149 kg)  04/03/24 (!) 333 lb (151 kg)  02/28/24 (!) 330 lb (149.7 kg)    Physical Exam Vitals and nursing note reviewed. Exam conducted with a chaperone present.  Constitutional:      General: She is awake. She is not in acute distress.    Appearance: Normal appearance. She is well-developed and well-groomed. She is obese. She is not ill-appearing or toxic-appearing.  HENT:     Head: Normocephalic and atraumatic.     Right Ear: Hearing, tympanic membrane, ear canal and external ear normal. No drainage.     Left Ear: Hearing, tympanic membrane, ear canal and external ear normal. No drainage.     Nose: Nose normal.     Right Sinus: No maxillary sinus tenderness or frontal sinus tenderness.     Left Sinus: No maxillary sinus tenderness or frontal sinus tenderness.     Mouth/Throat:     Mouth: Mucous membranes are moist.     Pharynx: Oropharynx is clear. Uvula midline. No pharyngeal swelling, oropharyngeal exudate or posterior oropharyngeal erythema.  Eyes:     General: Lids are normal.        Right eye: No discharge.        Left eye: No discharge.     Extraocular Movements: Extraocular movements intact.     Conjunctiva/sclera: Conjunctivae normal.     Pupils: Pupils are equal, round, and reactive to light.     Visual Fields: Right eye visual fields normal and left eye visual fields normal.  Neck:     Thyroid: No thyromegaly.     Vascular: No carotid bruit.     Trachea: Trachea normal.  Cardiovascular:     Rate and Rhythm: Normal rate  and regular rhythm.     Heart sounds: Normal heart sounds. No murmur heard.    No gallop.  Pulmonary:     Effort: Pulmonary effort is normal. No accessory muscle usage or respiratory distress.     Breath sounds: Normal breath sounds.  Chest:  Breasts:    Right: Normal.     Left: Normal.  Abdominal:     General: Bowel sounds are normal.     Palpations: Abdomen is soft. There is no hepatomegaly or splenomegaly.     Tenderness: There is no abdominal tenderness.  Musculoskeletal:        General: Normal range of motion.     Cervical back: Normal range of motion and neck supple.     Right lower leg: No edema.     Left lower leg: No edema.  Lymphadenopathy:     Head:     Right side of head: No submental, submandibular, tonsillar, preauricular or posterior auricular adenopathy.     Left side of head: No submental, submandibular, tonsillar, preauricular or posterior auricular adenopathy.     Cervical: No cervical adenopathy.     Upper Body:     Right upper body: No supraclavicular, axillary or pectoral adenopathy.     Left upper body: No supraclavicular, axillary or pectoral adenopathy.  Skin:    General: Skin is warm and dry.     Capillary Refill: Capillary refill takes less than 2 seconds.     Findings: No rash.  Neurological:     Mental Status: She is alert and oriented to person, place, and time.     Gait: Gait is intact.     Deep Tendon Reflexes: Reflexes are normal and symmetric.     Reflex Scores:      Brachioradialis reflexes are 2+ on the right side and 2+ on the left side.      Patellar reflexes are 2+ on the right side and 2+ on the left side. Psychiatric:        Attention and Perception: Attention normal.        Mood and Affect: Mood normal.        Speech: Speech normal.        Behavior: Behavior normal. Behavior is cooperative.        Thought Content: Thought content normal.        Judgment: Judgment normal.    Results for orders placed or performed in visit on  10/08/24  Bayer DCA Hb A1c Waived   Collection Time: 10/08/24  8:50 AM  Result Value Ref Range   HB A1C (BAYER DCA - WAIVED) 5.9 (H) 4.8 - 5.6 %  Microalbumin, Urine Waived   Collection Time: 10/08/24  8:50 AM  Result Value Ref Range   Microalb, Ur Waived 80 (H) 0 - 19 mg/L   Creatinine, Urine Waived 200 10 - 300 mg/dL   Microalb/Creat Ratio 30-300 (H) <30 mg/g      Assessment & Plan:   Problem List Items Addressed This Visit       Cardiovascular and Mediastinum   Paroxysmal tachycardia (HCC)   Stable, continue Bystolic  as ordered by Dr. Gollan, cardiology, is tolerating well.  Will not increase at this time, although per cardiology note can consider increase to 5 MG BID or 10 MG nightly if needed in future.  Overall her HR is remaining stable at this time with no symptoms.      Relevant Medications   losartan  (COZAAR ) 25 MG tablet   rosuvastatin  (CRESTOR ) 10 MG tablet   Hypertension associated with diabetes (HCC)   Chronic, stable.  BP at goal in office today and well below goal on home readings. Continue Losartan  50 MG daily and Bystolic  5 MG to take at night. Tolerating medications well, may need to reduce  in future if levels get too low.  Will continue collaboration with cardiology. Recommend she monitor BP at least a few mornings a week at home and document.  DASH diet at home. Labs: CBC, CMP, TSH.  Urine ALB 80 October 2025. Refills sent in.      Relevant Medications   losartan  (COZAAR ) 25 MG tablet   rosuvastatin  (CRESTOR ) 10 MG tablet   metFORMIN  (GLUCOPHAGE ) 500 MG tablet   Semaglutide,0.25 or 0.5MG /DOS, (OZEMPIC, 0.25 OR 0.5 MG/DOSE,) 2 MG/3ML SOPN   Other Relevant Orders   Bayer DCA Hb A1c Waived (Completed)   Microalbumin, Urine Waived (Completed)   CBC with Differential/Platelet     Endocrine   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  Diagnosed in October 2021 -- A1c 5.9% at this time and urine ALB 80 October 2025.  She  would like to trial Ozempic to help her back pain/weight loss.  Will send Ozempic 0.25/0.5 MG dosing in. Educated her on this medication. No family history of thyroid cancer (MTC, MEN 2, thyroid cell tumors) or pancreatitis.  Stop Metformin . Continue to monitor BS daily.   - Vaccines up to date - ARB and statin on board - Eye and foot exams up to date.      Relevant Medications   losartan  (COZAAR ) 25 MG tablet   rosuvastatin  (CRESTOR ) 10 MG tablet   metFORMIN  (GLUCOPHAGE ) 500 MG tablet   Semaglutide,0.25 or 0.5MG /DOS, (OZEMPIC, 0.25 OR 0.5 MG/DOSE,) 2 MG/3ML SOPN   Other Relevant Orders   Bayer DCA Hb A1c Waived (Completed)   Microalbumin, Urine Waived (Completed)   Hypothyroid   Chronic, stable.  Continue current medication regimen and adjust as needed. Thyroid labs today.      Relevant Medications   levothyroxine  (SYNTHROID ) 50 MCG tablet   Other Relevant Orders   T4, free   TSH   Hyperlipidemia associated with type 2 diabetes mellitus (HCC)   Chronic, stable.  Continue Rosuvastatin  at 2 days a week which she is tolerating.  Lipid panel obtained today.  Refill sent in.      Relevant Medications   losartan  (COZAAR ) 25 MG tablet   rosuvastatin  (CRESTOR ) 10 MG tablet   metFORMIN  (GLUCOPHAGE ) 500 MG tablet   Semaglutide,0.25 or 0.5MG /DOS, (OZEMPIC, 0.25 OR 0.5 MG/DOSE,) 2 MG/3ML SOPN   Other Relevant Orders   Bayer DCA Hb A1c Waived (Completed)   Comprehensive metabolic panel with GFR   Lipid Panel w/o Chol/HDL Ratio     Other   Vitamin D  deficiency   Ongoing, continue weekly supplement and recheck level today.  Could consider changing to daily Vitamin D3 if level improving.      Relevant Orders   VITAMIN D  25 Hydroxy (Vit-D Deficiency, Fractures)   Pain of left calf   Acute, will obtain STAT ultrasound of left lower extremity due to symptoms.  Concern for DVT. If normal, then may be coming from her lower back issues.  May benefit physical therapy in this case.       Relevant Orders   US  Venous Img Lower Unilateral Left (DVT) (Completed)   Class 3 severe obesity with serious comorbidity and body mass index (BMI) of 60.0 to 69.9 in adult (HCC)   BMI 60.07. Recommended eating smaller high protein, low fat meals more frequently and exercising 30 mins a day 5 times a week with a goal of 10-15lb weight loss in the next 3 months. Patient voiced their understanding and motivation to adhere  to these recommendations.       Relevant Medications   metFORMIN  (GLUCOPHAGE ) 500 MG tablet   Semaglutide,0.25 or 0.5MG /DOS, (OZEMPIC, 0.25 OR 0.5 MG/DOSE,) 2 MG/3ML SOPN   B12 deficiency   History of lows, but recent was stable.  Noted on past labs and is not taking supplement at present.  Will check level today.      Relevant Orders   Vitamin B12   Other Visit Diagnoses       Flu vaccine need       Flu vaccine today, educated patient.   Relevant Orders   Flu vaccine trivalent PF, 6mos and older(Flulaval,Afluria,Fluarix,Fluzone) (Completed)     Pneumococcal vaccination given       PCV20 in office today, educated patient.   Relevant Orders   Pneumococcal conjugate vaccine 20-valent (Completed)     Encounter for screening mammogram for malignant neoplasm of breast       Breast cancer screening.   Relevant Orders   MM 3D SCREENING MAMMOGRAM BILATERAL BREAST     Encounter for annual physical exam       Annual physical today with labs and health maintenance reviewed, discussed with patient.        Follow up plan: Return in about 4 weeks (around 11/05/2024) for T2DM -- changed to Ozempic.   LABORATORY TESTING:  - Pap smear: up to date  IMMUNIZATIONS:   - Tdap: Tetanus vaccination status reviewed: last tetanus booster within 10 years. - Influenza: Provided today - Pneumovax: Not applicable - Prevnar: Provided today - COVID: Refused - HPV: Not applicable - Shingrix vaccine: Not applicable  SCREENING: -Mammogram: Not Up To Date -- due in April 2025 -  Colonoscopy: Up to date  - Bone Density: Not applicable  -Hearing Test: Not applicable  -Spirometry: Not applicable   PATIENT COUNSELING:   Advised to take 1 mg of folate supplement per day if capable of pregnancy.   Sexuality: Discussed sexually transmitted diseases, partner selection, use of condoms, avoidance of unintended pregnancy  and contraceptive alternatives.   Advised to avoid cigarette smoking.  I discussed with the patient that most people either abstain from alcohol or drink within safe limits (<=14/week and <=4 drinks/occasion for males, <=7/weeks and <= 3 drinks/occasion for females) and that the risk for alcohol disorders and other health effects rises proportionally with the number of drinks per week and how often a drinker exceeds daily limits.  Discussed cessation/primary prevention of drug use and availability of treatment for abuse.   Diet: Encouraged to adjust caloric intake to maintain  or achieve ideal body weight, to reduce intake of dietary saturated fat and total fat, to limit sodium intake by avoiding high sodium foods and not adding table salt, and to maintain adequate dietary potassium and calcium  preferably from fresh fruits, vegetables, and low-fat dairy products.    Stressed the importance of regular exercise  Injury prevention: Discussed safety belts, safety helmets, smoke detector, smoking near bedding or upholstery.   Dental health: Discussed importance of regular tooth brushing, flossing, and dental visits.    NEXT PREVENTATIVE PHYSICAL DUE IN 1 YEAR. Return in about 4 weeks (around 11/05/2024) for T2DM -- changed to Ozempic.

## 2024-10-08 NOTE — Assessment & Plan Note (Signed)
 BMI 60.07. Recommended eating smaller high protein, low fat meals more frequently and exercising 30 mins a day 5 times a week with a goal of 10-15lb weight loss in the next 3 months. Patient voiced their understanding and motivation to adhere to these recommendations.

## 2024-10-08 NOTE — Assessment & Plan Note (Signed)
 Acute, will obtain STAT ultrasound of left lower extremity due to symptoms.  Concern for DVT. If normal, then may be coming from her lower back issues.  May benefit physical therapy in this case.

## 2024-10-08 NOTE — Assessment & Plan Note (Signed)
Ongoing, continue weekly supplement and recheck level today.  Could consider changing to daily Vitamin D3 if level improving.

## 2024-10-08 NOTE — Assessment & Plan Note (Signed)
 Chronic, stable.  BP at goal in office today and well below goal on home readings. Continue Losartan  50 MG daily and Bystolic  5 MG to take at night. Tolerating medications well, may need to reduce in future if levels get too low.  Will continue collaboration with cardiology. Recommend she monitor BP at least a few mornings a week at home and document.  DASH diet at home. Labs: CBC, CMP, TSH.  Urine ALB 80 October 2025. Refills sent in.

## 2024-10-08 NOTE — Assessment & Plan Note (Signed)
 Chronic, stable.  Continue Rosuvastatin at 2 days a week which she is tolerating.  Lipid panel obtained today.  Refill sent in.

## 2024-10-08 NOTE — Assessment & Plan Note (Signed)
Chronic, stable.  Continue current medication regimen and adjust as needed.  Thyroid labs today. 

## 2024-10-08 NOTE — Progress Notes (Signed)
 Contacted via MyChart  No blood  clot present, good news. This may be more back related then. Would you like to go to physical therapy sessions?

## 2024-10-08 NOTE — Assessment & Plan Note (Addendum)
 Chronic, ongoing.  Diagnosed in October 2021 -- A1c 5.9% at this time and urine ALB 80 October 2025.  She would like to trial Ozempic to help her back pain/weight loss.  Will send Ozempic 0.25/0.5 MG dosing in. Educated her on this medication. No family history of thyroid cancer (MTC, MEN 2, thyroid cell tumors) or pancreatitis.  Stop Metformin . Continue to monitor BS daily.   - Vaccines up to date - ARB and statin on board - Eye and foot exams up to date.

## 2024-10-08 NOTE — Assessment & Plan Note (Signed)
Stable, continue Bystolic as ordered by Dr. Gollan, cardiology, is tolerating well.  Will not increase at this time, although per cardiology note can consider increase to 5 MG BID or 10 MG nightly if needed in future.  Overall her HR is remaining stable at this time with no symptoms. 

## 2024-10-08 NOTE — Assessment & Plan Note (Signed)
 History of lows, but recent was stable.  Noted on past labs and is not taking supplement at present.  Will check level today.

## 2024-10-09 LAB — COMPREHENSIVE METABOLIC PANEL WITH GFR
ALT: 12 IU/L (ref 0–32)
AST: 26 IU/L (ref 0–40)
Albumin: 3.7 g/dL — ABNORMAL LOW (ref 3.9–4.9)
Alkaline Phosphatase: 88 IU/L (ref 41–116)
BUN/Creatinine Ratio: 15 (ref 9–23)
BUN: 9 mg/dL (ref 6–24)
Bilirubin Total: 0.7 mg/dL (ref 0.0–1.2)
CO2: 27 mmol/L (ref 20–29)
Calcium: 9.2 mg/dL (ref 8.7–10.2)
Chloride: 102 mmol/L (ref 96–106)
Creatinine, Ser: 0.62 mg/dL (ref 0.57–1.00)
Globulin, Total: 3 g/dL (ref 1.5–4.5)
Glucose: 100 mg/dL — ABNORMAL HIGH (ref 70–99)
Potassium: 4.3 mmol/L (ref 3.5–5.2)
Sodium: 142 mmol/L (ref 134–144)
Total Protein: 6.7 g/dL (ref 6.0–8.5)
eGFR: 110 mL/min/1.73 (ref >59)

## 2024-10-09 LAB — CBC WITH DIFFERENTIAL/PLATELET
Basophils Absolute: 0.1 x10E3/uL (ref 0.0–0.2)
Basos: 1 %
EOS (ABSOLUTE): 0.2 x10E3/uL (ref 0.0–0.4)
Eos: 2 %
Hematocrit: 42.7 % (ref 34.0–46.6)
Hemoglobin: 13.5 g/dL (ref 11.1–15.9)
Immature Grans (Abs): 0 x10E3/uL (ref 0.0–0.1)
Immature Granulocytes: 0 %
Lymphocytes Absolute: 2.5 x10E3/uL (ref 0.7–3.1)
Lymphs: 34 %
MCH: 28.7 pg (ref 26.6–33.0)
MCHC: 31.6 g/dL (ref 31.5–35.7)
MCV: 91 fL (ref 79–97)
Monocytes Absolute: 0.6 x10E3/uL (ref 0.1–0.9)
Monocytes: 8 %
Neutrophils Absolute: 3.9 x10E3/uL (ref 1.4–7.0)
Neutrophils: 55 %
Platelets: 243 x10E3/uL (ref 150–450)
RBC: 4.71 x10E6/uL (ref 3.77–5.28)
RDW: 13 % (ref 11.7–15.4)
WBC: 7.2 x10E3/uL (ref 3.4–10.8)

## 2024-10-09 LAB — LIPID PANEL W/O CHOL/HDL RATIO
Cholesterol, Total: 170 mg/dL (ref 100–199)
HDL: 39 mg/dL — ABNORMAL LOW (ref >39)
LDL Chol Calc (NIH): 112 mg/dL — ABNORMAL HIGH (ref 0–99)
Triglycerides: 104 mg/dL (ref 0–149)
VLDL Cholesterol Cal: 19 mg/dL (ref 5–40)

## 2024-10-09 LAB — TSH: TSH: 2.49 u[IU]/mL (ref 0.450–4.500)

## 2024-10-09 LAB — VITAMIN B12: Vitamin B-12: 457 pg/mL (ref 232–1245)

## 2024-10-09 LAB — T4, FREE: Free T4: 1.49 ng/dL (ref 0.82–1.77)

## 2024-10-09 LAB — VITAMIN D 25 HYDROXY (VIT D DEFICIENCY, FRACTURES): Vit D, 25-Hydroxy: 50.9 ng/mL (ref 30.0–100.0)

## 2024-10-09 NOTE — Progress Notes (Signed)
 Contacted via MyChart  Good afternoon Amy [name], your labs have returned and overall remain stable. Kidney function, creatinine and eGFR, remains normal, as is liver function, AST and ALT. Lipid panel is showing some trend up in LDL. Your past levels were good.  Are you taking your Rosuvastatin  daily? Please let me know so I know whether we need to adjust dose.  Remainder of labs are stable.  Any questions? Keep being amazing!!  Thank you for allowing me to participate in your care.  I appreciate you. Kindest regards, Fallyn Munnerlyn

## 2024-10-10 ENCOUNTER — Other Ambulatory Visit (HOSPITAL_COMMUNITY): Payer: Self-pay

## 2024-10-12 ENCOUNTER — Other Ambulatory Visit (HOSPITAL_COMMUNITY): Payer: Self-pay

## 2024-10-12 ENCOUNTER — Telehealth: Payer: Self-pay

## 2024-10-12 NOTE — Telephone Encounter (Signed)
 Pharmacy Patient Advocate Encounter   Received notification from Onbase that prior authorization for (OZEMPIC, 0.25 OR 0.5 MG/DOSE,) 2 MG/3ML SOPN  is required/requested.   Insurance verification completed.   The patient is insured through [employer].   Per test claim: PA required; PA submitted to above mentioned insurance via Latent Key/confirmation #/EOC [id number] Status is pending

## 2024-10-22 ENCOUNTER — Other Ambulatory Visit (HOSPITAL_COMMUNITY): Payer: Self-pay

## 2024-10-22 NOTE — Telephone Encounter (Signed)
 Pharmacy Patient Advocate Encounter  Received notification from [employer] that Prior Authorization for Ozempic (0.25 or 0.5 MG/DOSE) 2MG /3ML pen-injectors has been APPROVED from 10.31.25 to 11.5.26649. Ran test claim, Copay is $rts rx last filled on 11.6.25. This test claim was processed through [facility]- copay amounts may vary at other pharmacies due to pharmacy/plan contracts, or as the patient moves through the different stages of their insurance plan.   PA #/Case ID/Reference #: [id number]

## 2024-11-04 NOTE — Patient Instructions (Signed)

## 2024-11-06 ENCOUNTER — Ambulatory Visit: Admitting: Nurse Practitioner

## 2024-11-06 ENCOUNTER — Encounter: Payer: Self-pay | Admitting: Nurse Practitioner

## 2024-11-06 DIAGNOSIS — I152 Hypertension secondary to endocrine disorders: Secondary | ICD-10-CM

## 2024-11-06 DIAGNOSIS — E1169 Type 2 diabetes mellitus with other specified complication: Secondary | ICD-10-CM

## 2024-11-06 DIAGNOSIS — Z7985 Long-term (current) use of injectable non-insulin antidiabetic drugs: Secondary | ICD-10-CM

## 2024-11-06 DIAGNOSIS — E1159 Type 2 diabetes mellitus with other circulatory complications: Secondary | ICD-10-CM | POA: Diagnosis not present

## 2024-11-06 NOTE — Progress Notes (Signed)
 BP 90/61 (BP Location: Left Arm, Patient Position: Sitting, Cuff Size: Large)   Pulse 70   Temp 97.9 F (36.6 C) (Oral)   Resp 15   Ht 5' 2.01 (1.575 m)   Wt (!) 326 lb 9.6 oz (148.1 kg)   LMP 11/04/2023 (Exact Date)   SpO2 99%   BMI 59.72 kg/m    Subjective:    Patient ID: Amy [name] Husband, female    DOB: 26-Feb-1976, 48 y.o.   MRN: [id number]  HPI: Malley Briyanna Billingham is a 48 y.o. female  Chief Complaint  Patient presents with   Diabetes    Changed to Ozempic , some minor constipation.    DIABETES Last A1c October 5.9%. Switched over to Ozempic , has been taking for one week. No nausea at this time. Tolerating at present. Metformin  was stopped. Hypoglycemic episodes:no Polydipsia/polyuria: no Visual disturbance: no Chest pain: no Paresthesias: no Glucose Monitoring: yes  Accucheck frequency: Daily  Fasting glucose: 109 to 114  Post prandial:  Evening:  Before meals: Taking Insulin ?: no  Long acting insulin :  Short acting insulin : Blood Pressure Monitoring: daily Retinal Examination: Up to Date Dr. Carolee New Exam: Up to Date Diabetic Education: Completed Pneumovax: Up to Date Influenza: Up to Date Aspirin: no   Relevant past medical, surgical, family and social history reviewed and updated as indicated. Interim medical history since our last visit reviewed. Allergies and medications reviewed and updated.  Review of Systems  Constitutional:  Negative for appetite change and fatigue.  HENT: Negative.    Eyes: Negative.   Respiratory:  Negative for chest tightness and shortness of breath.   Cardiovascular:  Negative for chest pain and leg swelling.  Gastrointestinal: Negative.   Endocrine: Negative for polydipsia and polyuria.  Genitourinary:  Negative for frequency and urgency.  Allergic/Immunologic: Negative.   Neurological:  Negative for light-headedness and headaches.  Hematological: Negative.     Per HPI unless specifically indicated above      Objective:    BP 90/61 (BP Location: Left Arm, Patient Position: Sitting, Cuff Size: Large)   Pulse 70   Temp 97.9 F (36.6 C) (Oral)   Resp 15   Ht 5' 2.01 (1.575 m)   Wt (!) 326 lb 9.6 oz (148.1 kg)   LMP 11/04/2023 (Exact Date)   SpO2 99%   BMI 59.72 kg/m   Wt Readings from Last 3 Encounters:  11/06/24 (!) 326 lb 9.6 oz (148.1 kg)  10/08/24 (!) 328 lb 6.4 oz (149 kg)  04/03/24 (!) 333 lb (151 kg)    Physical Exam Vitals reviewed.  Constitutional:      Appearance: Normal appearance. She is well-developed and well-groomed. She is morbidly obese. She is not ill-appearing.  Neck:     Thyroid: No thyroid mass.     Vascular: No carotid bruit.  Cardiovascular:     Rate and Rhythm: Normal rate and regular rhythm.     Heart sounds: Normal heart sounds. No murmur heard. Pulmonary:     Effort: Pulmonary effort is normal. No respiratory distress.     Breath sounds: Normal breath sounds and air entry. No wheezing.  Abdominal:     General: Bowel sounds are normal.     Palpations: Abdomen is soft.  Musculoskeletal:        General: Normal range of motion.     Cervical back: Normal range of motion and neck supple.     Right lower leg: No edema.     Left lower leg: No edema.  Lymphadenopathy:     Cervical: No cervical adenopathy.     Right cervical: No superficial cervical adenopathy.    Left cervical: No superficial cervical adenopathy.  Skin:    General: Skin is warm and dry.  Neurological:     General: No focal deficit present.     Mental Status: She is alert and oriented to person, place, and time. Mental status is at baseline.     Deep Tendon Reflexes: Reflexes are normal and symmetric.  Psychiatric:        Attention and Perception: Attention and perception normal.        Mood and Affect: Mood and affect normal.        Speech: Speech normal.        Behavior: Behavior normal. Behavior is cooperative.        Thought Content: Thought content normal.        Cognition and  Memory: Cognition and memory normal.        Judgment: Judgment normal.     Results for orders placed or performed in visit on 10/08/24  Bayer DCA Hb A1c Waived   Collection Time: 10/08/24  8:50 AM  Result Value Ref Range   HB A1C (BAYER DCA - WAIVED) 5.9 (H) 4.8 - 5.6 %  Microalbumin, Urine Waived   Collection Time: 10/08/24  8:50 AM  Result Value Ref Range   Microalb, Ur Waived 80 (H) 0 - 19 mg/L   Creatinine, Urine Waived 200 10 - 300 mg/dL   Microalb/Creat Ratio 30-300 (H) <30 mg/g  T4, free   Collection Time: 10/08/24  8:51 AM  Result Value Ref Range   Free T4 1.49 0.82 - 1.77 ng/dL  CBC with Differential/Platelet   Collection Time: 10/08/24  8:51 AM  Result Value Ref Range   WBC 7.2 3.4 - 10.8 x10E3/uL   RBC 4.71 3.77 - 5.28 x10E6/uL   Hemoglobin 13.5 11.1 - 15.9 g/dL   Hematocrit 57.2 65.9 - 46.6 %   MCV 91 79 - 97 fL   MCH 28.7 26.6 - 33.0 pg   MCHC 31.6 31.5 - 35.7 g/dL   RDW 86.9 88.2 - 84.5 %   Platelets 243 150 - 450 x10E3/uL   Neutrophils 55 Not Estab. %   Lymphs 34 Not Estab. %   Monocytes 8 Not Estab. %   Eos 2 Not Estab. %   Basos 1 Not Estab. %   Neutrophils Absolute 3.9 1.4 - 7.0 x10E3/uL   Lymphocytes Absolute 2.5 0.7 - 3.1 x10E3/uL   Monocytes Absolute 0.6 0.1 - 0.9 x10E3/uL   EOS (ABSOLUTE) 0.2 0.0 - 0.4 x10E3/uL   Basophils Absolute 0.1 0.0 - 0.2 x10E3/uL   Immature Granulocytes 0 Not Estab. %   Immature Grans (Abs) 0.0 0.0 - 0.1 x10E3/uL  Comprehensive metabolic panel with GFR   Collection Time: 10/08/24  8:51 AM  Result Value Ref Range   Glucose 100 (H) 70 - 99 mg/dL   BUN 9 6 - 24 mg/dL   Creatinine, Ser 9.37 0.57 - 1.00 mg/dL   eGFR 889 >40 fO/fpw/8.26   BUN/Creatinine Ratio 15 9 - 23   Sodium 142 134 - 144 mmol/L   Potassium 4.3 3.5 - 5.2 mmol/L   Chloride 102 96 - 106 mmol/L   CO2 27 20 - 29 mmol/L   Calcium  9.2 8.7 - 10.2 mg/dL   Total Protein 6.7 6.0 - 8.5 g/dL   Albumin 3.7 (L) 3.9 - 4.9 g/dL   Globulin, Total 3.0  1.5 - 4.5 g/dL    Bilirubin Total 0.7 0.0 - 1.2 mg/dL   Alkaline Phosphatase 88 41 - 116 IU/L   AST 26 0 - 40 IU/L   ALT 12 0 - 32 IU/L  TSH   Collection Time: 10/08/24  8:51 AM  Result Value Ref Range   TSH 2.490 0.450 - 4.500 uIU/mL  Lipid Panel w/o Chol/HDL Ratio   Collection Time: 10/08/24  8:51 AM  Result Value Ref Range   Cholesterol, Total 170 100 - 199 mg/dL   Triglycerides 895 0 - 149 mg/dL   HDL 39 (L) >60 mg/dL   VLDL Cholesterol Cal 19 5 - 40 mg/dL   LDL Chol Calc (NIH) 887 (H) 0 - 99 mg/dL  Vitamin B12   Collection Time: 10/08/24  8:51 AM  Result Value Ref Range   Vitamin B-12 457 232 - 1,245 pg/mL  VITAMIN D  25 Hydroxy (Vit-D Deficiency, Fractures)   Collection Time: 10/08/24  8:51 AM  Result Value Ref Range   Vit D, 25-Hydroxy 50.9 30.0 - 100.0 ng/mL      Assessment & Plan:   Problem List Items Addressed This Visit       Cardiovascular and Mediastinum   Hypertension associated with diabetes (HCC)   Chronic, stable.  BP well below goal in office and at home. Reduce Losartan  to 12.5 MG daily and continue Bystolic  5 MG to take at night. Tolerating medications well, may need to reduce in future if levels get too low.  Will continue collaboration with cardiology. Recommend she monitor BP at least a few mornings a week at home and document.  DASH diet at home. Labs: up to date.  Urine ALB 80 October 2025.         Endocrine   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  Diagnosed in October 2021 -- A1c 5.9% in October and urine ALB 80 October 2025.  Continue Ozempic  0.25/0.5 MG dosing, tolerating thus far. Educated her on this medication. Will adjust further next visit if continues to tolerate.  No family history of thyroid cancer (MTC, MEN 2, thyroid cell tumors) or pancreatitis.  Continue to monitor BS daily.   - Vaccines up to date - ARB and statin on board - Eye and foot exams up to date.        Follow up plan: Return in about 8 weeks  (around 01/04/2025) for T2DM, HTN/HLD.

## 2024-11-06 NOTE — Assessment & Plan Note (Addendum)
 Chronic, ongoing.  Diagnosed in October 2021 -- A1c 5.9% in October and urine ALB 80 October 2025.  Continue Ozempic  0.25/0.5 MG dosing, tolerating thus far. Educated her on this medication. Will adjust further next visit if continues to tolerate.  No family history of thyroid cancer (MTC, MEN 2, thyroid cell tumors) or pancreatitis.  Continue to monitor BS daily.   - Vaccines up to date - ARB and statin on board - Eye and foot exams up to date.

## 2024-11-06 NOTE — Assessment & Plan Note (Signed)
 Chronic, stable.  BP well below goal in office and at home. Reduce Losartan  to 12.5 MG daily and continue Bystolic  5 MG to take at night. Tolerating medications well, may need to reduce in future if levels get too low.  Will continue collaboration with cardiology. Recommend she monitor BP at least a few mornings a week at home and document.  DASH diet at home. Labs: up to date.  Urine ALB 80 October 2025.

## 2024-11-15 ENCOUNTER — Ambulatory Visit
Admission: RE | Admit: 2024-11-15 | Discharge: 2024-11-15 | Disposition: A | Source: Ambulatory Visit | Attending: Nurse Practitioner | Admitting: Nurse Practitioner

## 2024-11-15 DIAGNOSIS — Z1231 Encounter for screening mammogram for malignant neoplasm of breast: Secondary | ICD-10-CM | POA: Insufficient documentation

## 2024-11-23 NOTE — Progress Notes (Signed)
 Contacted via MyChart   Normal mammogram, may repeat in one year:)

## 2024-11-25 ENCOUNTER — Other Ambulatory Visit: Payer: Self-pay | Admitting: Cardiovascular Disease

## 2024-12-09 NOTE — Progress Notes (Unsigned)
 Cardiology Office Note  Date:  12/11/2024   ID:  Amy [name], DOB November 15, 1976, MRN [id number]  PCP:  Valerio Melanie DASEN, NP   Chief Complaint  Patient presents with   12 month follow up     Patient denies any cardiac issues or concerns today.     HPI:  [name] is a 48 year old woman with past medical history of  morbid obesity Hypertension Diabetes Exertional tachycardia Who presents for follow-up of her tachycardia, cardiac risk factors  Last seen in clinic by myself 11/24  Reports for the past 6 weeks has been on Ozempic  Denies significant side effects On Ozempic , noticed slightly elevated heart rate in the morning up from 60 now running 80 Drinking less fluids, just not hungry or thirsty  Continues on low-dose bystolic  Son graduating high school  Unable to tolerate Crestor , has myalgias Labs reviewed: Total chol 140-up to 170 LDL 79 up to 112 A1C 6.0  EKG personally reviewed by myself on todays visit EKG Interpretation Date/Time:  Tuesday December 11 2024 08:29:51 EST Ventricular Rate:  78 PR Interval:  176 QRS Duration:  102 QT Interval:  366 QTC Calculation: 417 R Axis:   0  Text Interpretation: Normal sinus rhythm When compared with ECG of 14-Oct-2023 09:22, PR interval has decreased Confirmed by Perla Lye [phone]) on 12/11/2024 8:41:22 AM    PMH:   has a past medical history of Allergy , Asthma, Back pain, Diabetes mellitus without complication (HCC), Essential hypertension, Heart burn, Hypothyroidism, Joint pain, Obesity, Prediabetes, Psoriasis, Sinus tachycardia, Thyroid disease (2021), Vitamin B 12 deficiency, and Vitamin D  deficiency.  PSH:    Past Surgical History:  Procedure Laterality Date   CESAREAN SECTION     X 2   COLONOSCOPY WITH PROPOFOL  N/A 11/17/2022   Procedure: COLONOSCOPY WITH PROPOFOL ;  Surgeon: Unk Corinn Skiff, MD;  Location: [facility] ENDOSCOPY;  Service: Gastroenterology;  Laterality: N/A;   HAND SURGERY Right     THUMB   HERNIA REPAIR     right thumb surgery      Current Outpatient Medications  Medication Sig Dispense Refill   levocetirizine (XYZAL ) 5 MG tablet Take 1 tablet by mouth once daily 90 tablet 4   levothyroxine  (SYNTHROID ) 50 MCG tablet Take 1 tablet (50 mcg total) by mouth daily. 90 tablet 4   losartan  (COZAAR ) 25 MG tablet Take 1 tablet (25 mg total) by mouth daily. 90 tablet 4   Multiple Vitamin (MULTIVITAMINS PO) Take 1 tablet by mouth daily.     nebivolol  (BYSTOLIC ) 5 MG tablet TAKE 1 TABLET BY MOUTH ONCE DAILY. MAY TAKE AN EXTRA 5 MG AS NEEDED FOR TACHYCARDIA 30 tablet 0   rosuvastatin  (CRESTOR ) 10 MG tablet Take 1 tablet (10 mg total) by mouth 2 (two) times a week. 24 tablet 4   Semaglutide ,0.25 or 0.5MG /DOS, (OZEMPIC , 0.25 OR 0.5 MG/DOSE,) 2 MG/3ML SOPN Start by injecting 0.25 MG into the skin weekly for 4 weeks and then on week 5 change to 0.5 MG weekly injected in the skin. 3 mL 3   Vitamin D , Ergocalciferol , (DRISDOL ) 1.25 MG (50000 UNIT) CAPS capsule Take 1 capsule (50,000 Units total) by mouth once a week. 12 capsule 4   methocarbamol  (ROBAXIN ) 500 MG tablet Take 1 tablet (500 mg total) by mouth every 8 (eight) hours as needed for muscle spasms. (Patient not taking: Reported on 12/11/2024) 60 tablet 0   No current facility-administered medications for this visit.    Allergies:   Bee venom, Biaxin [clarithromycin], and  Rosemary oil   Social History:  The patient  reports that she quit smoking about 19 years ago. Her smoking use included cigarettes. She started smoking about 24 years ago. She has a 2.5 pack-year smoking history. She has never used smokeless tobacco. She reports that she does not drink alcohol and does not use drugs.   Family History:   family history includes ADD / ADHD in her son; Alcoholism in her father; Allergies in her daughter; Anemia in her mother; Appendicitis in her paternal grandfather; Asthma in her daughter; Autoimmune disease in her mother; Cirrhosis  in her father; Congestive Heart Failure in her paternal grandmother; Diabetes in her maternal grandmother; Drug abuse in her father; Glaucoma in her father; Hypertension in her maternal grandmother and mother; Kidney disease in her mother; Osteoporosis in her maternal grandmother; Thyroid disease in her maternal grandmother and mother.   Review of Systems: Review of Systems  Constitutional: Negative.   HENT: Negative.    Respiratory: Negative.    Cardiovascular: Negative.   Gastrointestinal: Negative.   Musculoskeletal: Negative.   Neurological: Negative.   Psychiatric/Behavioral: Negative.    All other systems reviewed and are negative.   PHYSICAL EXAM: VS:  BP 110/78 (BP Location: Left Arm, Patient Position: Sitting, Cuff Size: Large)   Pulse 78   Ht 5' 2 (1.575 m)   Wt (!) 325 lb (147.4 kg)   SpO2 94%   BMI 59.44 kg/m  , BMI Body mass index is 59.44 kg/m. Constitutional:  oriented to person, place, and time. No distress.  HENT:  Head: Grossly normal Eyes:  no discharge. No scleral icterus.  Neck: No JVD, no carotid bruits  Cardiovascular: Regular rate and rhythm, no murmurs appreciated Pulmonary/Chest: Clear to auscultation bilaterally, no wheezes or rales Abdominal: Soft.  no distension.  no tenderness.  Musculoskeletal: Normal range of motion Neurological:  normal muscle tone. Coordination normal. No atrophy Skin: Skin warm and dry Psychiatric: normal affect, pleasant  Recent Labs: 10/08/2024: ALT 12; BUN 9; Creatinine, Ser 0.62; Hemoglobin 13.5; Platelets 243; Potassium 4.3; Sodium 142; TSH 2.490    Lipid Panel Lab Results  Component Value Date   CHOL 170 10/08/2024   HDL 39 (L) 10/08/2024   LDLCALC 112 (H) 10/08/2024   TRIG 104 10/08/2024      Wt Readings from Last 3 Encounters:  12/11/24 (!) 325 lb (147.4 kg)  11/06/24 (!) 326 lb 9.6 oz (148.1 kg)  10/08/24 (!) 328 lb 6.4 oz (149 kg)     ASSESSMENT AND PLAN:  Problem List Items Addressed This Visit        Cardiology Problems   Hypertension associated with diabetes (HCC)   Relevant Orders   EKG 12-Lead (Completed)   Hyperlipidemia associated with type 2 diabetes mellitus (HCC)   Paroxysmal tachycardia (HCC) - Primary   Relevant Orders   EKG 12-Lead (Completed)     Other   Type 2 diabetes mellitus with morbid obesity (HCC)   Other Visit Diagnoses       Essential hypertension       Relevant Orders   EKG 12-Lead (Completed)     Morbid obesity (HCC)         Anxiety          Essential hypertension Blood pressure is well controlled on today's visit. No changes made to the medications.  Tachycardia Continue  Bystolic  5 mg daily,  Symptoms controlled Recommend she stay hydrated  Morbid obesity We have encouraged continued exercise, careful diet management in an  effort to lose weight.  Anxiety  stable  diabetes A1C 6, on Ozempic   Hyperlipidemia Taking less Crestor  secondary to myalgias increase in cholesterol Recommend she start Zetia 10 mg daily, hold Crestor    Signed, Tim Arriyana Rodell, M.D., Ph.D. [facility] [place], [place] [phone]

## 2024-12-11 ENCOUNTER — Encounter: Payer: Self-pay | Admitting: Cardiovascular Disease

## 2024-12-11 ENCOUNTER — Ambulatory Visit: Attending: Cardiovascular Disease | Admitting: Cardiovascular Disease

## 2024-12-11 VITALS — BP 110/78 | HR 78 | Ht 62.0 in | Wt 325.0 lb

## 2024-12-11 DIAGNOSIS — E785 Hyperlipidemia, unspecified: Secondary | ICD-10-CM

## 2024-12-11 DIAGNOSIS — I152 Hypertension secondary to endocrine disorders: Secondary | ICD-10-CM | POA: Diagnosis not present

## 2024-12-11 DIAGNOSIS — I479 Paroxysmal tachycardia, unspecified: Secondary | ICD-10-CM | POA: Diagnosis not present

## 2024-12-11 DIAGNOSIS — E1159 Type 2 diabetes mellitus with other circulatory complications: Secondary | ICD-10-CM

## 2024-12-11 DIAGNOSIS — F419 Anxiety disorder, unspecified: Secondary | ICD-10-CM

## 2024-12-11 DIAGNOSIS — E1169 Type 2 diabetes mellitus with other specified complication: Secondary | ICD-10-CM | POA: Diagnosis not present

## 2024-12-11 DIAGNOSIS — I10 Essential (primary) hypertension: Secondary | ICD-10-CM | POA: Diagnosis not present

## 2024-12-11 MED ORDER — EZETIMIBE 10 MG PO TABS: 10.0000 mg | ORAL_TABLET | Freq: Every day | ORAL | 3 refills | Status: AC

## 2024-12-11 MED ORDER — NEBIVOLOL HCL 5 MG PO TABS: ORAL_TABLET | ORAL | 0 refills | Status: DC

## 2024-12-11 NOTE — Patient Instructions (Signed)
 Medication Instructions:  Please stop crestor  Start zetia 10 mg daily  If you need a refill on your cardiac medications before your next appointment, please call your pharmacy.   Lab work: No new labs needed  Testing/Procedures: No new testing needed  Follow-Up: At [facility], you and your health needs are our priority.  As part of our continuing mission to provide you with exceptional heart care, we have created designated Provider Care Teams.  These Care Teams include your primary Cardiologist (physician) and Advanced Practice Providers (APPs -  Physician Assistants and Nurse Practitioners) who all work together to provide you with the care you need, when you need it.  You will need a follow up appointment in 12 months  Providers on your designated Care Team:   Lonni Meager, NP Bernardino Bring, PA-C Cadence [place], [place]  COVID-19 Vaccine Information can be found at: [email or web address] For questions related to vaccine distribution or appointments, please email [email or web address] or call [phone].

## 2025-01-04 ENCOUNTER — Other Ambulatory Visit: Payer: Self-pay | Admitting: Cardiovascular Disease

## 2025-01-05 NOTE — Patient Instructions (Signed)
 Be Involved in Caring For Your Health:  Taking Medications When medications are taken as directed, they can greatly improve your health. But if they are not taken as prescribed, they may not work. In some cases, not taking them correctly can be harmful. To help ensure your treatment remains effective and safe, understand your medications and how to take them. Bring your medications to each visit for review by your provider.  Your lab results, notes, and after visit summary will be available on My Chart. We strongly encourage you to use this feature. If lab results are abnormal the clinic will contact you with the appropriate steps. If the clinic does not contact you assume the results are satisfactory. You can always view your results on My Chart. If you have questions regarding your health or results, please contact the clinic during office hours. You can also ask questions on My Chart.  We at [facility] are grateful that you chose [place] to provide your care. We strive to provide evidence-based and compassionate care and are always looking for feedback. If you get a survey from the clinic please complete this so we can hear your opinions.  Diabetes Mellitus and Foot Care Diabetes, also called diabetes mellitus, may cause problems with your feet and legs because of poor blood flow (circulation). Poor circulation may make your skin: Become thinner and drier. Break more easily. Heal more slowly. Peel and crack. You may also have nerve damage (neuropathy). This can cause decreased feeling in your legs and feet. This means that you may not notice minor injuries to your feet that could lead to more serious problems. Finding and treating problems early is the best way to prevent future foot problems. How to care for your feet Foot hygiene  Wash your feet daily with warm water and mild soap. Do not use hot water. Then, pat your feet and the areas between your toes until they are fully dry. Do  not soak your feet. This can dry your skin. Trim your toenails straight across. Do not dig under them or around the cuticle. File the edges of your nails with an emery board or nail file. Apply a moisturizing lotion or petroleum jelly to the skin on your feet and to dry, brittle toenails. Use lotion that does not contain alcohol and is unscented. Do not apply lotion between your toes. Shoes and socks Wear clean socks or stockings every day. Make sure they are not too tight. Do not wear knee-high stockings. These may decrease blood flow to your legs. Wear shoes that fit well and have enough cushioning. Always look in your shoes before you put them on to be sure there are no objects inside. To break in new shoes, wear them for just a few hours a day. This prevents injuries on your feet. Wounds, scrapes, corns, and calluses  Check your feet daily for blisters, cuts, bruises, sores, and redness. If you cannot see the bottom of your feet, use a mirror or ask someone for help. Do not cut off corns or calluses or try to remove them with medicine. If you find a minor scrape, cut, or break in the skin on your feet, keep it and the skin around it clean and dry. You may clean these areas with mild soap and water. Do not clean the area with peroxide, alcohol, or iodine. If you have a wound, scrape, corn, or callus on your foot, look at it several times a day to make sure it  is healing and not infected. Check for: Redness, swelling, or pain. Fluid or blood. Warmth. Pus or a bad smell. General tips Do not cross your legs. This may decrease blood flow to your feet. Do not use heating pads or hot water bottles on your feet. They may burn your skin. If you have lost feeling in your feet or legs, you may not know this is happening until it is too late. Protect your feet from hot and cold by wearing shoes, such as at the beach or on hot pavement. Schedule a complete foot exam at least once a year or more often if  you have foot problems. Report any cuts, sores, or bruises to your health care provider right away. Where to find more information American Diabetes Association: diabetes.org Association of Diabetes Care & Education Specialists: diabeteseducator.org Contact a health care provider if: You have a condition that increases your risk of infection, and you have any cuts, sores, or bruises on your feet. You have an injury that is not healing. You have redness on your legs or feet. You feel burning or tingling in your legs or feet. You have pain or cramps in your legs and feet. Your legs or feet are numb. Your feet always feel cold. You have pain around any toenails. Get help right away if: You have a wound, scrape, corn, or callus on your foot and: You have signs of infection. You have a fever. You have a red line going up your leg. This information is not intended to replace advice given to you by your health care provider. Make sure you discuss any questions you have with your health care provider. Document Revised: 06/02/2022 Document Reviewed: 06/02/2022 Elsevier Patient Education  2024 [employer].

## 2025-01-07 ENCOUNTER — Ambulatory Visit: Admitting: Nurse Practitioner

## 2025-01-09 ENCOUNTER — Ambulatory Visit: Admitting: Nurse Practitioner

## 2025-01-09 ENCOUNTER — Encounter: Payer: Self-pay | Admitting: Nurse Practitioner

## 2025-01-09 DIAGNOSIS — E1169 Type 2 diabetes mellitus with other specified complication: Secondary | ICD-10-CM

## 2025-01-09 DIAGNOSIS — Z7985 Long-term (current) use of injectable non-insulin antidiabetic drugs: Secondary | ICD-10-CM

## 2025-01-09 MED ORDER — TRULICITY 0.75 MG/0.5ML ~~LOC~~ SOAJ: 0.7500 mg | SUBCUTANEOUS | 2 refills | Status: AC

## 2025-01-09 NOTE — Assessment & Plan Note (Signed)
 Chronic, ongoing.  Diagnosed in October 2021 -- A1c 5.9% in October and urine ALB 80 October 2025.  Having some ADRs with Ozempic  at 0.5 MG, will trial changing to Trulicity  0.75 MG weekly. Discussed with her, as she would like to try to stay on a GLP1. Educated her on this medication. No family history of thyroid cancer (MTC, MEN 2, thyroid cell tumors) or pancreatitis.  Continue to monitor BS daily.  If does not tolerate injections could consider trial of oral Rybelsus, but unsure she would tolerate. - Vaccines up to date - ARB and statin on board - Eye and foot exams up to date.

## 2025-01-09 NOTE — Progress Notes (Signed)
 "  BP 110/73   Pulse 68   Temp 97.8 F (36.6 C) (Oral)   Ht 5' 2 (1.575 m)   Wt (!) 328 lb 3.2 oz (148.9 kg)   SpO2 96%   BMI 60.03 kg/m    Subjective:    Patient ID: Amy [name], female    DOB: 1975-12-19, 49 y.o.   MRN: [id number]  HPI: Amy [name] is a 49 y.o. female  Chief Complaint  Patient presents with   Diabetes    Eye exam requested from Dr. Carolee   DIABETES Last A1c in October 5.9%.  We switched to Ozempic  0.5 MG dosing. She has constipation at baseline, but this is a bit worse with GLP1, and has had two migraines since starting. She will also get diarrhea. Taking Famotidine for acid reflux with this, takes daily. Hypoglycemic episodes:no Polydipsia/polyuria: no Visual disturbance: no Chest pain: no Paresthesias: no Glucose Monitoring: yes  Accucheck frequency: Daily  Fasting glucose: 90 to 100  Post prandial:  Evening:  Before meals: Taking Insulin ?: no  Long acting insulin :  Short acting insulin : Blood Pressure Monitoring: daily Retinal Examination: Not up to Date Foot Exam: Up to Date Diabetic Education: Not Completed Pneumovax: Up to Date Influenza: Up to Date Aspirin: no   Relevant past medical, surgical, family and social history reviewed and updated as indicated. Interim medical history since our last visit reviewed. Allergies and medications reviewed and updated.  Review of Systems  Constitutional:  Negative for activity change, appetite change, fatigue and fever.  HENT: Negative.    Respiratory:  Negative for cough, chest tightness, shortness of breath and wheezing.   Cardiovascular:  Negative for chest pain, palpitations and leg swelling.  Gastrointestinal:  Positive for constipation and diarrhea.  Endocrine: Negative for polydipsia, polyphagia and polyuria.  Neurological:  Positive for headaches.  Hematological: Negative.   Psychiatric/Behavioral: Negative.      Per HPI unless specifically indicated above     Objective:     BP 110/73   Pulse 68   Temp 97.8 F (36.6 C) (Oral)   Ht 5' 2 (1.575 m)   Wt (!) 328 lb 3.2 oz (148.9 kg)   SpO2 96%   BMI 60.03 kg/m   Wt Readings from Last 3 Encounters:  01/09/25 (!) 328 lb 3.2 oz (148.9 kg)  12/11/24 (!) 325 lb (147.4 kg)  11/06/24 (!) 326 lb 9.6 oz (148.1 kg)    Physical Exam Vitals and nursing note reviewed.  Constitutional:      General: She is awake. She is not in acute distress.    Appearance: She is well-developed and well-groomed. She is obese. She is not ill-appearing or toxic-appearing.  HENT:     Head: Normocephalic.     Right Ear: Hearing and external ear normal.     Left Ear: Hearing and external ear normal.  Eyes:     General: Lids are normal.        Right eye: No discharge.        Left eye: No discharge.     Conjunctiva/sclera: Conjunctivae normal.     Pupils: Pupils are equal, round, and reactive to light.  Neck:     Thyroid: No thyromegaly.     Vascular: No carotid bruit.  Cardiovascular:     Rate and Rhythm: Normal rate and regular rhythm.     Heart sounds: Normal heart sounds. No murmur heard.    No gallop.  Pulmonary:     Effort: Pulmonary effort is  normal. No accessory muscle usage or respiratory distress.     Breath sounds: Normal breath sounds. No decreased breath sounds, wheezing or rales.  Abdominal:     General: Bowel sounds are normal. There is no distension.     Palpations: Abdomen is soft.     Tenderness: There is no abdominal tenderness.  Musculoskeletal:     Cervical back: Normal range of motion and neck supple.     Right lower leg: No edema.     Left lower leg: No edema.  Lymphadenopathy:     Cervical: No cervical adenopathy.  Skin:    General: Skin is warm and dry.  Neurological:     Mental Status: She is alert and oriented to person, place, and time.     Deep Tendon Reflexes: Reflexes are normal and symmetric.     Reflex Scores:      Brachioradialis reflexes are 2+ on the right side and 2+ on the left  side.      Patellar reflexes are 2+ on the right side and 2+ on the left side. Psychiatric:        Attention and Perception: Attention normal.        Mood and Affect: Mood normal.        Speech: Speech normal.        Behavior: Behavior normal. Behavior is cooperative.        Thought Content: Thought content normal.     Results for orders placed or performed in visit on 10/08/24  Bayer DCA Hb A1c Waived   Collection Time: 10/08/24  8:50 AM  Result Value Ref Range   HB A1C (BAYER DCA - WAIVED) 5.9 (H) 4.8 - 5.6 %  Microalbumin, Urine Waived   Collection Time: 10/08/24  8:50 AM  Result Value Ref Range   Microalb, Ur Waived 80 (H) 0 - 19 mg/L   Creatinine, Urine Waived 200 10 - 300 mg/dL   Microalb/Creat Ratio 30-300 (H) <30 mg/g  T4, free   Collection Time: 10/08/24  8:51 AM  Result Value Ref Range   Free T4 1.49 0.82 - 1.77 ng/dL  CBC with Differential/Platelet   Collection Time: 10/08/24  8:51 AM  Result Value Ref Range   WBC 7.2 3.4 - 10.8 x10E3/uL   RBC 4.71 3.77 - 5.28 x10E6/uL   Hemoglobin 13.5 11.1 - 15.9 g/dL   Hematocrit 57.2 65.9 - 46.6 %   MCV 91 79 - 97 fL   MCH 28.7 26.6 - 33.0 pg   MCHC 31.6 31.5 - 35.7 g/dL   RDW 86.9 88.2 - 84.5 %   Platelets 243 150 - 450 x10E3/uL   Neutrophils 55 Not Estab. %   Lymphs 34 Not Estab. %   Monocytes 8 Not Estab. %   Eos 2 Not Estab. %   Basos 1 Not Estab. %   Neutrophils Absolute 3.9 1.4 - 7.0 x10E3/uL   Lymphocytes Absolute 2.5 0.7 - 3.1 x10E3/uL   Monocytes Absolute 0.6 0.1 - 0.9 x10E3/uL   EOS (ABSOLUTE) 0.2 0.0 - 0.4 x10E3/uL   Basophils Absolute 0.1 0.0 - 0.2 x10E3/uL   Immature Granulocytes 0 Not Estab. %   Immature Grans (Abs) 0.0 0.0 - 0.1 x10E3/uL  Comprehensive metabolic panel with GFR   Collection Time: 10/08/24  8:51 AM  Result Value Ref Range   Glucose 100 (H) 70 - 99 mg/dL   BUN 9 6 - 24 mg/dL   Creatinine, Ser 9.37 0.57 - 1.00 mg/dL   eGFR 889 >  59 mL/min/1.73   BUN/Creatinine Ratio 15 9 - 23   Sodium  142 134 - 144 mmol/L   Potassium 4.3 3.5 - 5.2 mmol/L   Chloride 102 96 - 106 mmol/L   CO2 27 20 - 29 mmol/L   Calcium  9.2 8.7 - 10.2 mg/dL   Total Protein 6.7 6.0 - 8.5 g/dL   Albumin 3.7 (L) 3.9 - 4.9 g/dL   Globulin, Total 3.0 1.5 - 4.5 g/dL   Bilirubin Total 0.7 0.0 - 1.2 mg/dL   Alkaline Phosphatase 88 41 - 116 IU/L   AST 26 0 - 40 IU/L   ALT 12 0 - 32 IU/L  TSH   Collection Time: 10/08/24  8:51 AM  Result Value Ref Range   TSH 2.490 0.450 - 4.500 uIU/mL  Lipid Panel w/o Chol/HDL Ratio   Collection Time: 10/08/24  8:51 AM  Result Value Ref Range   Cholesterol, Total 170 100 - 199 mg/dL   Triglycerides 895 0 - 149 mg/dL   HDL 39 (L) >60 mg/dL   VLDL Cholesterol Cal 19 5 - 40 mg/dL   LDL Chol Calc (NIH) 887 (H) 0 - 99 mg/dL  Vitamin B12   Collection Time: 10/08/24  8:51 AM  Result Value Ref Range   Vitamin B-12 457 232 - 1,245 pg/mL  VITAMIN D  25 Hydroxy (Vit-D Deficiency, Fractures)   Collection Time: 10/08/24  8:51 AM  Result Value Ref Range   Vit D, 25-Hydroxy 50.9 30.0 - 100.0 ng/mL      Assessment & Plan:   Problem List Items Addressed This Visit       Endocrine   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  Diagnosed in October 2021 -- A1c 5.9% in October and urine ALB 80 October 2025.  Having some ADRs with Ozempic  at 0.5 MG, will trial changing to Trulicity  0.75 MG weekly. Discussed with her, as she would like to try to stay on a GLP1. Educated her on this medication. No family history of thyroid cancer (MTC, MEN 2, thyroid cell tumors) or pancreatitis.  Continue to monitor BS daily.  If does not tolerate injections could consider trial of oral Rybelsus, but unsure she would tolerate. - Vaccines up to date - ARB and statin on board - Eye and foot exams up to date.      Relevant Medications   Dulaglutide  (TRULICITY ) 0.75 MG/0.5ML SOAJ     Follow up plan: Return in about 5 weeks (around 02/13/2025) for T2DM,  HTN/HLD.      "

## 2025-01-14 ENCOUNTER — Telehealth: Payer: Self-pay

## 2025-01-14 ENCOUNTER — Other Ambulatory Visit (HOSPITAL_COMMUNITY): Payer: Self-pay

## 2025-01-14 NOTE — Telephone Encounter (Signed)
 Pharmacy Patient Advocate Encounter   Received notification from [employer] that prior authorization for Trulicity  0.75MG /0.5ML auto-injectors is required/requested.   Insurance verification completed.   The patient is insured through [employer].   Per test claim: PA required; PA submitted to above mentioned insurance via Latent Key/confirmation #/EOC [id number] Status is pending

## 2025-01-15 ENCOUNTER — Ambulatory Visit: Admitting: Nurse Practitioner

## 2025-03-06 ENCOUNTER — Ambulatory Visit: Admitting: Nurse Practitioner
